# Patient Record
Sex: Female | Born: 1935 | Race: White | Hispanic: No | Smoking: Former smoker
Health system: Southern US, Community
[De-identification: ages and names within clinical notes are randomized; demographics above are authoritative.]

## PROBLEM LIST (undated history)

## (undated) DIAGNOSIS — R519 Headache, unspecified: Secondary | ICD-10-CM

## (undated) DIAGNOSIS — I499 Cardiac arrhythmia, unspecified: Secondary | ICD-10-CM

## (undated) DIAGNOSIS — J449 Chronic obstructive pulmonary disease, unspecified: Secondary | ICD-10-CM

## (undated) DIAGNOSIS — IMO0001 Reserved for inherently not codable concepts without codable children: Secondary | ICD-10-CM

## (undated) DIAGNOSIS — K5792 Diverticulitis of intestine, part unspecified, without perforation or abscess without bleeding: Secondary | ICD-10-CM

## (undated) DIAGNOSIS — I509 Heart failure, unspecified: Secondary | ICD-10-CM

## (undated) DIAGNOSIS — S82409A Unspecified fracture of shaft of unspecified fibula, initial encounter for closed fracture: Secondary | ICD-10-CM

## (undated) DIAGNOSIS — K219 Gastro-esophageal reflux disease without esophagitis: Secondary | ICD-10-CM

## (undated) DIAGNOSIS — S2220XA Unspecified fracture of sternum, initial encounter for closed fracture: Secondary | ICD-10-CM

## (undated) DIAGNOSIS — M1990 Unspecified osteoarthritis, unspecified site: Secondary | ICD-10-CM

## (undated) DIAGNOSIS — I4891 Unspecified atrial fibrillation: Secondary | ICD-10-CM

## (undated) DIAGNOSIS — S8263XA Displaced fracture of lateral malleolus of unspecified fibula, initial encounter for closed fracture: Secondary | ICD-10-CM

## (undated) DIAGNOSIS — R51 Headache: Secondary | ICD-10-CM

## (undated) HISTORY — PX: CARDIAC CATHETERIZATION: SHX172

## (undated) HISTORY — DX: Unspecified atrial fibrillation: I48.91

## (undated) HISTORY — PX: LAPAROSCOPIC CHOLECYSTECTOMY: SUR755

## (undated) HISTORY — PX: COLONOSCOPY: SHX174

## (undated) HISTORY — DX: Displaced fracture of lateral malleolus of unspecified fibula, initial encounter for closed fracture: S82.63XA

## (undated) HISTORY — DX: Unspecified fracture of sternum, initial encounter for closed fracture: S22.20XA

## (undated) HISTORY — DX: Unspecified fracture of shaft of unspecified fibula, initial encounter for closed fracture: S82.409A

## (undated) HISTORY — PX: VAGINAL HYSTERECTOMY: SUR661

## (undated) HISTORY — PX: ATRIAL FIBRILLATION ABLATION: SHX5732

## (undated) HISTORY — PX: JOINT REPLACEMENT: SHX530

---

## 1988-03-31 ENCOUNTER — Encounter: Payer: Self-pay | Admitting: Cardiology

## 2007-03-27 LAB — PULMONARY FUNCTION TEST

## 2007-06-14 ENCOUNTER — Encounter: Payer: Self-pay | Admitting: Cardiology

## 2007-08-21 ENCOUNTER — Encounter: Payer: Self-pay | Admitting: Cardiology

## 2009-02-19 ENCOUNTER — Encounter: Payer: Self-pay | Admitting: Cardiology

## 2009-02-24 ENCOUNTER — Ambulatory Visit: Payer: Self-pay | Admitting: Occupational Medicine

## 2009-02-28 ENCOUNTER — Ambulatory Visit: Payer: Self-pay | Admitting: Family Medicine

## 2009-02-28 DIAGNOSIS — B029 Zoster without complications: Secondary | ICD-10-CM

## 2009-02-28 HISTORY — DX: Zoster without complications: B02.9

## 2009-03-19 ENCOUNTER — Encounter: Payer: Self-pay | Admitting: Family Medicine

## 2009-05-13 ENCOUNTER — Ambulatory Visit: Payer: Self-pay | Admitting: Family Medicine

## 2009-05-13 DIAGNOSIS — I4891 Unspecified atrial fibrillation: Secondary | ICD-10-CM | POA: Insufficient documentation

## 2009-05-13 DIAGNOSIS — M810 Age-related osteoporosis without current pathological fracture: Secondary | ICD-10-CM | POA: Insufficient documentation

## 2009-05-27 ENCOUNTER — Telehealth: Payer: Self-pay | Admitting: Family Medicine

## 2009-05-28 ENCOUNTER — Encounter: Payer: Self-pay | Admitting: Family Medicine

## 2009-06-03 ENCOUNTER — Encounter: Payer: Self-pay | Admitting: Family Medicine

## 2009-06-03 LAB — CONVERTED CEMR LAB
INR: 2.3 — ABNORMAL HIGH (ref 0.0–1.5)
Prothrombin Time: 20.4 s — ABNORMAL HIGH (ref 10.2–14.0)

## 2009-06-13 ENCOUNTER — Encounter: Payer: Self-pay | Admitting: Family Medicine

## 2009-06-13 LAB — CONVERTED CEMR LAB: INR: 2.4 — ABNORMAL HIGH (ref 0.0–1.5)

## 2009-07-14 ENCOUNTER — Encounter: Payer: Self-pay | Admitting: Family Medicine

## 2009-07-14 LAB — CONVERTED CEMR LAB
INR: 2.1 — ABNORMAL HIGH (ref 0.0–1.5)
Prothrombin Time: 19.3 s — ABNORMAL HIGH (ref 10.2–14.0)

## 2009-08-04 ENCOUNTER — Encounter: Payer: Self-pay | Admitting: Family Medicine

## 2009-08-04 LAB — CONVERTED CEMR LAB
INR: 2.8 — ABNORMAL HIGH (ref 0.0–1.5)
Prothrombin Time: 22.8 s — ABNORMAL HIGH (ref 10.2–14.0)

## 2009-08-13 ENCOUNTER — Ambulatory Visit: Payer: Self-pay | Admitting: Family Medicine

## 2009-08-13 ENCOUNTER — Encounter: Payer: Self-pay | Admitting: Family Medicine

## 2009-08-28 ENCOUNTER — Encounter: Payer: Self-pay | Admitting: Family Medicine

## 2009-08-28 LAB — CONVERTED CEMR LAB
INR: 2.21 — ABNORMAL HIGH (ref <1.50)
Prothrombin Time: 19.7 s — ABNORMAL HIGH (ref 10.2–14.0)

## 2009-10-09 ENCOUNTER — Encounter: Payer: Self-pay | Admitting: Family Medicine

## 2009-10-09 LAB — CONVERTED CEMR LAB
INR: 1.74 — ABNORMAL HIGH (ref <1.50)
Prothrombin Time: 17 s — ABNORMAL HIGH (ref 10.2–14.0)

## 2009-10-23 ENCOUNTER — Telehealth: Payer: Self-pay | Admitting: Family Medicine

## 2009-10-23 ENCOUNTER — Encounter: Payer: Self-pay | Admitting: Family Medicine

## 2009-10-23 LAB — CONVERTED CEMR LAB
INR: 1.74 — ABNORMAL HIGH (ref <1.50)
Prothrombin Time: 17 s — ABNORMAL HIGH (ref 10.2–14.0)

## 2009-11-01 ENCOUNTER — Encounter: Payer: Self-pay | Admitting: Family Medicine

## 2009-11-03 LAB — CONVERTED CEMR LAB: Prothrombin Time: 24.5 s — ABNORMAL HIGH (ref 11.6–15.2)

## 2009-11-17 ENCOUNTER — Encounter: Payer: Self-pay | Admitting: Family Medicine

## 2009-12-10 ENCOUNTER — Ambulatory Visit: Payer: Self-pay | Admitting: Family Medicine

## 2009-12-10 ENCOUNTER — Telehealth: Payer: Self-pay | Admitting: Family Medicine

## 2009-12-12 ENCOUNTER — Encounter: Payer: Self-pay | Admitting: Family Medicine

## 2009-12-12 ENCOUNTER — Telehealth: Payer: Self-pay | Admitting: Family Medicine

## 2009-12-12 LAB — CONVERTED CEMR LAB: INR: 1.47

## 2010-01-05 ENCOUNTER — Encounter: Payer: Self-pay | Admitting: Family Medicine

## 2010-01-06 ENCOUNTER — Telehealth: Payer: Self-pay | Admitting: Family Medicine

## 2010-01-07 ENCOUNTER — Encounter: Payer: Self-pay | Admitting: Family Medicine

## 2010-01-26 ENCOUNTER — Encounter: Payer: Self-pay | Admitting: Family Medicine

## 2010-01-26 LAB — CONVERTED CEMR LAB: INR: 2.21 — ABNORMAL HIGH (ref <1.50)

## 2010-02-11 ENCOUNTER — Encounter: Payer: Self-pay | Admitting: Cardiology

## 2010-02-11 ENCOUNTER — Ambulatory Visit: Payer: Self-pay | Admitting: Cardiology

## 2010-02-12 ENCOUNTER — Ambulatory Visit: Payer: Self-pay | Admitting: Family Medicine

## 2010-02-16 ENCOUNTER — Encounter: Payer: Self-pay | Admitting: Family Medicine

## 2010-02-16 LAB — CONVERTED CEMR LAB
INR: 2.97 — ABNORMAL HIGH (ref <1.50)
Prothrombin Time: 23.6 s — ABNORMAL HIGH (ref 10.2–14.0)

## 2010-03-04 ENCOUNTER — Telehealth: Payer: Self-pay | Admitting: Family Medicine

## 2010-03-10 ENCOUNTER — Ambulatory Visit: Payer: Self-pay | Admitting: Family Medicine

## 2010-03-10 LAB — CONVERTED CEMR LAB
Glucose, Urine, Semiquant: NEGATIVE
Ketones, urine, test strip: NEGATIVE
Nitrite: NEGATIVE

## 2010-03-25 ENCOUNTER — Encounter: Payer: Self-pay | Admitting: Family Medicine

## 2010-04-08 ENCOUNTER — Ambulatory Visit: Payer: Self-pay | Admitting: Family Medicine

## 2010-04-08 LAB — CONVERTED CEMR LAB
Bilirubin Urine: NEGATIVE
INR: 1.85
Nitrite: NEGATIVE
Prothrombin Time: 21 s — ABNORMAL HIGH (ref 11.6–15.2)
Specific Gravity, Urine: 1.025

## 2010-04-09 ENCOUNTER — Encounter: Payer: Self-pay | Admitting: Family Medicine

## 2010-04-09 LAB — CONVERTED CEMR LAB
Trich, Wet Prep: NONE SEEN
Yeast Wet Prep HPF POC: NONE SEEN

## 2010-04-13 ENCOUNTER — Encounter: Admission: RE | Admit: 2010-04-13 | Discharge: 2010-04-13 | Payer: Self-pay | Admitting: Family Medicine

## 2010-04-15 ENCOUNTER — Encounter: Payer: Self-pay | Admitting: Family Medicine

## 2010-04-16 ENCOUNTER — Encounter: Payer: Self-pay | Admitting: Family Medicine

## 2010-04-21 ENCOUNTER — Ambulatory Visit: Payer: Self-pay | Admitting: Family Medicine

## 2010-04-21 DIAGNOSIS — Z87440 Personal history of urinary (tract) infections: Secondary | ICD-10-CM | POA: Insufficient documentation

## 2010-04-21 LAB — CONVERTED CEMR LAB
Bilirubin Urine: NEGATIVE
Glucose, Urine, Semiquant: NEGATIVE
Nitrite: NEGATIVE
pH: 5

## 2010-04-22 ENCOUNTER — Encounter: Payer: Self-pay | Admitting: Family Medicine

## 2010-04-23 LAB — CONVERTED CEMR LAB
AST: 21 units/L (ref 0–37)
Albumin: 3.9 g/dL (ref 3.5–5.2)
Alkaline Phosphatase: 64 units/L (ref 39–117)
CO2: 27 meq/L (ref 19–32)
Creatinine, Ser: 1.03 mg/dL (ref 0.40–1.20)
Glucose, Bld: 113 mg/dL — ABNORMAL HIGH (ref 70–99)
Potassium: 4.4 meq/L (ref 3.5–5.3)
Sodium: 142 meq/L (ref 135–145)
Total Protein: 6.5 g/dL (ref 6.0–8.3)

## 2010-04-29 ENCOUNTER — Encounter: Payer: Self-pay | Admitting: Cardiology

## 2010-04-29 ENCOUNTER — Ambulatory Visit: Payer: Self-pay | Admitting: Cardiology

## 2010-05-28 ENCOUNTER — Telehealth: Payer: Self-pay | Admitting: Cardiology

## 2010-05-29 ENCOUNTER — Encounter (INDEPENDENT_AMBULATORY_CARE_PROVIDER_SITE_OTHER): Payer: Self-pay | Admitting: *Deleted

## 2010-06-02 ENCOUNTER — Telehealth: Payer: Self-pay | Admitting: Cardiology

## 2010-06-02 ENCOUNTER — Telehealth: Payer: Self-pay | Admitting: Family Medicine

## 2010-06-03 ENCOUNTER — Telehealth: Payer: Self-pay | Admitting: Family Medicine

## 2010-06-08 ENCOUNTER — Encounter: Payer: Self-pay | Admitting: Family Medicine

## 2010-06-08 ENCOUNTER — Encounter: Payer: Self-pay | Admitting: Cardiology

## 2010-06-15 ENCOUNTER — Encounter: Payer: Self-pay | Admitting: Family Medicine

## 2010-06-15 LAB — CONVERTED CEMR LAB: INR: 2.7 — ABNORMAL HIGH (ref <1.50)

## 2010-06-16 ENCOUNTER — Encounter: Payer: Self-pay | Admitting: Family Medicine

## 2010-06-29 ENCOUNTER — Telehealth: Payer: Self-pay | Admitting: Family Medicine

## 2010-06-29 ENCOUNTER — Encounter: Payer: Self-pay | Admitting: Family Medicine

## 2010-06-29 LAB — CONVERTED CEMR LAB: INR: 3.43

## 2010-06-30 ENCOUNTER — Encounter: Payer: Self-pay | Admitting: Family Medicine

## 2010-06-30 LAB — CONVERTED CEMR LAB
INR: 3.43 — ABNORMAL HIGH (ref <1.50)
Prothrombin Time: 33.8 s — ABNORMAL HIGH (ref 11.6–15.2)

## 2010-07-13 ENCOUNTER — Encounter: Payer: Self-pay | Admitting: Family Medicine

## 2010-07-14 LAB — CONVERTED CEMR LAB: Prothrombin Time: 31.9 s — ABNORMAL HIGH (ref 11.6–15.2)

## 2010-07-22 ENCOUNTER — Encounter: Payer: Self-pay | Admitting: Family Medicine

## 2010-07-22 LAB — CONVERTED CEMR LAB: Prothrombin Time: 26.2 s — ABNORMAL HIGH (ref 11.6–15.2)

## 2010-07-24 ENCOUNTER — Ambulatory Visit: Payer: Self-pay | Admitting: Family Medicine

## 2010-07-27 ENCOUNTER — Ambulatory Visit: Payer: Self-pay | Admitting: Family Medicine

## 2010-07-27 LAB — CONVERTED CEMR LAB
Blood in Urine, dipstick: NEGATIVE
Nitrite: NEGATIVE
Urobilinogen, UA: 1

## 2010-07-28 ENCOUNTER — Telehealth: Payer: Self-pay | Admitting: Family Medicine

## 2010-08-05 ENCOUNTER — Encounter: Payer: Self-pay | Admitting: Family Medicine

## 2010-08-05 LAB — CONVERTED CEMR LAB: Prothrombin Time: 24.2 s — ABNORMAL HIGH (ref 11.6–15.2)

## 2010-08-17 ENCOUNTER — Telehealth: Payer: Self-pay | Admitting: Family Medicine

## 2010-08-17 ENCOUNTER — Encounter: Payer: Self-pay | Admitting: Family Medicine

## 2010-08-17 LAB — CONVERTED CEMR LAB
AST: 25 units/L
Alkaline Phosphatase: 51 units/L
Anion Gap: 7
INR: 1.6

## 2010-08-20 ENCOUNTER — Encounter: Payer: Self-pay | Admitting: Family Medicine

## 2010-08-26 ENCOUNTER — Encounter: Payer: Self-pay | Admitting: Family Medicine

## 2010-09-09 ENCOUNTER — Telehealth: Payer: Self-pay | Admitting: Family Medicine

## 2010-09-19 ENCOUNTER — Ambulatory Visit: Payer: Self-pay | Admitting: Family Medicine

## 2010-09-22 ENCOUNTER — Encounter: Payer: Self-pay | Admitting: Family Medicine

## 2010-09-23 ENCOUNTER — Telehealth (INDEPENDENT_AMBULATORY_CARE_PROVIDER_SITE_OTHER): Payer: Self-pay

## 2010-09-28 ENCOUNTER — Encounter: Payer: Self-pay | Admitting: Family Medicine

## 2010-09-28 ENCOUNTER — Telehealth: Payer: Self-pay | Admitting: Family Medicine

## 2010-09-28 ENCOUNTER — Ambulatory Visit: Payer: Self-pay | Admitting: Family Medicine

## 2010-09-28 DIAGNOSIS — R9389 Abnormal findings on diagnostic imaging of other specified body structures: Secondary | ICD-10-CM | POA: Insufficient documentation

## 2010-10-05 ENCOUNTER — Encounter: Payer: Self-pay | Admitting: Family Medicine

## 2010-10-05 LAB — CONVERTED CEMR LAB: INR: 2.49 — ABNORMAL HIGH (ref <1.50)

## 2010-10-09 ENCOUNTER — Telehealth (INDEPENDENT_AMBULATORY_CARE_PROVIDER_SITE_OTHER): Payer: Self-pay | Admitting: *Deleted

## 2010-10-16 ENCOUNTER — Telehealth: Payer: Self-pay | Admitting: Family Medicine

## 2010-10-16 ENCOUNTER — Encounter: Payer: Self-pay | Admitting: Family Medicine

## 2010-10-22 ENCOUNTER — Encounter: Payer: Self-pay | Admitting: Family Medicine

## 2010-10-22 LAB — CONVERTED CEMR LAB
INR: 2.05 — ABNORMAL HIGH (ref <1.50)
Prothrombin Time: 22.7 s — ABNORMAL HIGH (ref 11.6–15.2)

## 2010-11-11 ENCOUNTER — Encounter: Payer: Self-pay | Admitting: Family Medicine

## 2010-11-11 LAB — CONVERTED CEMR LAB
INR: 2.68 — ABNORMAL HIGH (ref <1.50)
Prothrombin Time: 28 s — ABNORMAL HIGH (ref 11.6–15.2)

## 2010-11-17 NOTE — Letter (Signed)
Summary: Letter to Patient Regarding Lab Results/[facility]  Letter to Patient Regarding Lab Results/[facility]   Imported By: Lanelle Bal 04/23/2010 13:17:54  _____________________________________________________________________  External Attachment:    Type:   Image     Comment:   External Document

## 2010-11-17 NOTE — Progress Notes (Signed)
Summary: question re med  Phone Note Call from Patient   Caller: Patient Reason for Call: Talk to Nurse Summary of Call: pt switching from coumadin to pradaxa, want to know if she just stops the coumadin and starts the pradaxa or is there a wait time between meds? also wants to know what symptoms to look out for? pls call [phone] ok to leave msg Initial call taken by: Glynda Jaeger,  May 28, 2010 9:50 AM  Follow-up for Phone Call        I spoke with the pt. She has decided against having a colonoscopy done. She will go ahead and start holding her coumadin today x 3 days. She is coming to pick up a lab order for a CBC/BMP in 7-10 days. The pt is agreeable. Follow-up by: Sherri Rad, RN, BSN,  May 28, 2010 10:09 AM

## 2010-11-17 NOTE — Progress Notes (Signed)
Summary: PT/INR results  Phone Note Other Incoming   Caller: [facility] Summary of Call: PT=17.0 INR=1.74 Initial call taken by: [name],  October 23, 2009 11:14 AM

## 2010-11-17 NOTE — Progress Notes (Signed)
Summary: Coumadin instructions  Phone Note Call from Patient Call back at Home Phone [phone]   Caller: Patient Call For: [name] MD Summary of Call: Ptcalls and wanted to know instructions for her Coumadin therapy- she is not gonna start the new med until after gets back from her trip to [place] in the Spring- please advise Initial call taken by: [name],  December 12, 2009 10:00 AM  Follow-up for Phone Call        Pt notified of Coumadin instructions Follow-up by: [name],  December 12, 2009 11:16 AM    New/Updated Medications: COUMADIN 5 MG TABS (WARFARIN SODIUM) Sunday - 6 mg, Monday - 5 mg, Tuesday - 6 mg, Wednesday - 5 mg, Thursday - 6 mg, Friday - 5 mg, Saturday - 6 mg COUMADIN 6 MG TABS (WARFARIN SODIUM) Sunday - 6 mg, Monday - 5 mg, Tuesday - 6 mg, Wednesday - 5 mg, Thursday - 6 mg, Friday - 5 mg, Saturday - 6 mg   Anticoagulation Management History:      Her anticoagulation is being managed by telephone today.  Anticoagulation is being administered due to chronic atrial fibrillation.  Plans are to keep the patient on coumadin for life.  Her last INR was 1.47 and today's INR is 1.47.    Anticoagulation Management Assessment/Plan:      The target INR is 2.0-3.0.  She is to have a PT/INR in 2 weeks.  Anticoagulation instructions were given to patient.         Current Anticoagulation Instructions: Take 6mg  tonight and then increase as below. Coumadin 5 mg tabs and Coumadin 6 mg tabs:  Sunday - 6 mg, Monday - 5 mg, Tuesday - 6 mg, Wednesday - 5 mg, Thursday - 6 mg, Friday - 5 mg, Saturday - 6 mg.  Repeat PT/INR in 2 weeks.

## 2010-11-17 NOTE — Progress Notes (Signed)
Summary: UTI sxs  Phone Note Call from Patient   Caller: Patient Call For: Terri Gasser MD Summary of Call: pt has UTI signs and has a hx of UTI's would like abx called in.Pt is in [place]. [facility] [place], [place]  Number is [phone] fax number is [phone] Initial call taken by: Avon Gully CMA, Duncan Dull),  September 09, 2010 9:15 AM  Follow-up for Phone Call        Rx Called In Follow-up by: Terri Gasser MD,  September 09, 2010 9:21 AM    New/Updated Medications: CIPROFLOXACIN HCL 500 MG TABS (CIPROFLOXACIN HCL) Take 1 tablet by mouth two times a day for 3 days Prescriptions: CIPROFLOXACIN HCL 500 MG TABS (CIPROFLOXACIN HCL) Take 1 tablet by mouth two times a day for 3 days  #6 x 0   Entered and Authorized by:   Terri Gasser MD   Signed by:   Terri Gasser MD on 09/09/2010   Method used:   Printed then faxed to ...       [facility]  [address] [phone]* (retail)       [address] [place], [place]  [address]       Ph: [phone] or [phone]       Fax: [phone]   RxID:   [id number]   Appended Document: UTI sxs 09/09/2010- [place]  [place] They [place] and had not received med. KJ LPN

## 2010-11-17 NOTE — Assessment & Plan Note (Signed)
Summary: URINE--UTI?  Nurse Visit   Allergies: 1)  ! Sulfa Laboratory Results   Urine Tests  Date/Time Received: 03/10/2010 Date/Time Reported: 03/10/2010  Routine Urinalysis   Color: yellow Appearance: Clear Glucose: negative   (Normal Range: Negative) Bilirubin: negative   (Normal Range: Negative) Ketone: negative   (Normal Range: Negative) Spec. Gravity: 1.020   (Normal Range: 1.003-1.035) Blood: trace-intact   (Normal Range: Negative) pH: 6.0   (Normal Range: 5.0-8.0) Protein: negative   (Normal Range: Negative) Urobilinogen: 0.2   (Normal Range: 0-1) Nitrite: negative   (Normal Range: Negative) Leukocyte Esterace: moderate   (Normal Range: Negative)       Orders Added: 1)  UA Dipstick w/o Micro (automated)  [81003] 2)  Est. Patient Level I [98119] Prescriptions: CEPHALEXIN 500 MG CAPS (CEPHALEXIN) 1 capsule by mouth three times a day x 7 days  #21 x 0   Entered and Authorized by:   Seymour Bars DO   Signed by:   Seymour Bars DO on 03/10/2010   Method used:   [facility]        [facility] [phone][facility] (retail)       [address]       [place], [place]  [address]       Ph: [phone] or [phone]       Fax: [phone]   RxID:   [id number]     Impression & Recommendations:  Problem # 1:  UTI (ICD-599.0) Treat with 7 days of Keflex.  Call if symptoms are not improving by Friday AM to recollect and cx. Her updated medication list for this problem includes:    Cephalexin 500 Mg Caps (Cephalexin) .Marland Kitchen... 1 capsule by mouth three times a day x 7 days  Orders: UA Dipstick w/o Micro (automated)  (81003)  Complete Medication List: 1)  Diltiazem Hcl Cr 240 Mg Xr24h-cap (Diltiazem hcl) .... Take 1 tablet by mouth once a day 2)  Coumadin Check  .... Pt/inr up to once a week.   dx: atrial fibrillation 3)  Vitamin C 1000 Mg Tabs (Ascorbic acid) .... Take 1 tablet by mouth once a day 4)  Glucosamine Chondroitin Complx Caps  (Glucosamine-chondroit-biofl-mn) .... Take 1 tablet by mouth once a day 5)  Fish Oil 1000 Mg Caps (Omega-3 fatty acids) .... Take one tablet by mouth once a day 6)  Eql Coq10 300 Mg Caps (Coenzyme q10) .... Once a day 7)  Lutein 20 Mg Caps (Lutein) .... Take one tablet by mouth once a day 8)  Warfarin Sodium 5 Mg Tabs (Warfarin sodium) .... Use as directed by anticoagulation clinic 9)  Warfarin Sodium 1 Mg Tabs (Warfarin sodium) .... Use as directed by anticoagualtion clinic 10)  Vitamin D 5000iu  .... Once a day 11)  Cephalexin 500 Mg Caps (Cephalexin) .Marland Kitchen.. 1 capsule by mouth three times a day x 7 days

## 2010-11-17 NOTE — Progress Notes (Signed)
Summary: Question about medication  Phone Note Call from Patient Call back at Home Phone [phone]   Caller: Patient Summary of Call: Pt have question about medication Initial call taken by: Judie Grieve,  June 02, 2010 9:20 AM  Follow-up for Phone Call        spoke with pt, she is having alot of GI symptoms from the pradaxa. the pt wishes to switch back to coumadin. will discuss with sally, pharm md. Deliah Goody, RN  June 02, 2010 10:33 AM  discussed with Orma Flaming MD, pt instructed to take pradaxa 150mg  two times a day for the next three days and also to take coumadin 6mg . after the three days she will stop the pradaxa and cont on the coumadin 6mg  daily. 6mg  of coumadin was her previous dosage. I left a merssage with dr Misty Stanley CMA to call pt and set up a protime check with them on tuesday or wed next week. will make dr Cathey Endow aware of the change. Deliah Goody, RN  June 02, 2010 10:44 AM

## 2010-11-17 NOTE — Assessment & Plan Note (Signed)
Summary: A fib   Vital Signs:  Patient profile:   77 year old female Height:      63.5 inches Weight:      150 pounds Pulse rate:   63 / minute BP sitting:   122 / 77  (left arm) Cuff size:   regular  Vitals Entered By: [name] (December 10, 2009 11:33 AM) CC: discuss meds   Primary Care Provider:  [name] MD  CC:  discuss meds.  History of Present Illness: Brought in an article from [facility] and is very interested in Pradaxa instead of coumadin for her afib.  Would like to discuss.   She is otherwise doing well and neesd a refil on her cardizem as well, which she uses for rate control.  NO CP, SOB, etc. She went to [facility] x 1 for cards but would like to start seeing cardiology here in our building with Dr. Jens Som as she feels it is much more convenient.   She also has some concern wtih her husband and his increased libido with his parkinsons.  This has been a little distressing for her.    Current Medications (verified): 1)  Diltiazem Hcl Cr 240 Mg Xr24h-Cap (Diltiazem Hcl) .... Take 1 Tablet By Mouth Once A Day 2)  Coumadin Check .... Pt/inr Up To Once A Week.   Dx: Atrial Fibrillation 3)  Liquid Calcium/vitamin D 600-200 Mg-Unit Caps (Calcium Carbonate-Vitamin D) .... Take 1 Tablet By Mouth Two Times A Day 4)  Vitamin C 1000 Mg Tabs (Ascorbic Acid) .... Take 1 Tablet By Mouth Once A Day 5)  Glucosamine Chondroitin Complx  Caps (Glucosamine-Chondroit-Biofl-Mn) .... Take 1 Tablet By Mouth Once A Day 6)  Coumadin 5 Mg Tabs (Warfarin Sodium) .... Sunday - 5 Mg, Monday - 5 Mg, Tuesday - 6 Mg, Wednesday - 5 Mg, Thursday - 6 Mg, Friday - 5 Mg, Saturday - 6 Mg 7)  Coumadin 6 Mg Tabs (Warfarin Sodium) .... Sunday - 5 Mg, Monday - 5 Mg, Tuesday - 6 Mg, Wednesday - 5 Mg, Thursday - 6 Mg, Friday - 5 Mg, Saturday - 6 Mg 8)  Fish Oil 1000 Mg Caps (Omega-3 Fatty Acids) .... Take One Tablet By Mouth Once A Day 9)  Co Q-10 150 Mg Caps (Coenzyme Q10) .... Take One  Tablet By Mouth Once A Day 10)  Lutein 20 Mg Caps (Lutein) .... Take One Tablet By Mouth Once A Day  Allergies (verified): No Known Drug Allergies  Comments:  Nurse/Medical Assistant: The patient's medications and allergies were reviewed with the patient and were updated in the Medication and Allergy Lists. [name] (December 10, 2009 11:35 AM)  Social History: REtired.  Some college. Married to her husband  Terri Small who has mild dementa and parkinsons.   Has 4 adult children. Lives with his daughter.   Former Smoker, quit 1961 Alcohol use-yes Drug use-no, 1-2 per week.  Regular exercise-yes   Impression & Recommendations:  Problem # 1:  ATRIAL FIBRILLATION (ICD-427.31) Discussed pradaxa.  It is more expensive but depending on the price the pt says she may be willing to pay it to not have to go to the lab for frequent INRs.  Wall call cardiology and ask their opinoin before we change it. Also needs new standing order for the lab. Went for her INR today. will f/u resuilts today.  Her updated medication list for this problem includes:    Diltiazem Hcl Cr 240 Mg Xr24h-cap (Diltiazem hcl) .Marland Kitchen... Take  1 tablet by mouth once a day    Coumadin 5 Mg Tabs (Warfarin sodium) ..... Sunday - 5 mg, monday - 5 mg, tuesday - 6 mg, wednesday - 5 mg, thursday - 6 mg, friday - 5 mg, saturday - 6 mg    Coumadin 1 Mg Tabs (Warfarin sodium) .Marland Kitchen... Take 1 tablet by mouth once a day as needed  Complete Medication List: 1)  Diltiazem Hcl Cr 240 Mg Xr24h-cap (Diltiazem hcl) .... Take 1 tablet by mouth once a day 2)  Coumadin Check  .... Pt/inr up to once a week.   dx: atrial fibrillation 3)  Liquid Calcium/vitamin D 600-200 Mg-unit Caps (Calcium carbonate-vitamin d) .... Take 1 tablet by mouth two times a day 4)  Vitamin C 1000 Mg Tabs (Ascorbic acid) .... Take 1 tablet by mouth once a day 5)  Glucosamine Chondroitin Complx Caps (Glucosamine-chondroit-biofl-mn) .... Take 1 tablet by mouth once a day 6)   Coumadin 5 Mg Tabs (Warfarin sodium) .... Sunday - 5 mg, monday - 5 mg, tuesday - 6 mg, wednesday - 5 mg, thursday - 6 mg, friday - 5 mg, saturday - 6 mg 7)  Coumadin 1 Mg Tabs (Warfarin sodium) .... Take 1 tablet by mouth once a day as needed 8)  Fish Oil 1000 Mg Caps (Omega-3 fatty acids) .... Take one tablet by mouth once a day 9)  Co Q-10 150 Mg Caps (Coenzyme q10) .... Take one tablet by mouth once a day 10)  Lutein 20 Mg Caps (Lutein) .... Take one tablet by mouth once a day Prescriptions: COUMADIN 1 MG TABS (WARFARIN SODIUM) Take 1 tablet by mouth once a day as needed Brand medically necessary #90 x 0   Entered and Authorized by:   [name] MD   Signed by:   [name] MD on 12/10/2009   Method used:   Print then Give to Patient   RxID:   [id number] COUMADIN 5 MG TABS (WARFARIN SODIUM) Sunday - 5 mg, Monday - 5 mg, Tuesday - 6 mg, Wednesday - 5 mg, Thursday - 6 mg, Friday - 5 mg, Saturday - 6 mg Brand medically necessary #90 x 0   Entered and Authorized by:   [name] MD   Signed by:   [name] MD on 12/10/2009   Method used:   Print then Give to Patient   RxID:   [id number] DILTIAZEM HCL CR 240 MG XR24H-CAP (DILTIAZEM HCL) Take 1 tablet by mouth once a day  #90 x 3   Entered and Authorized by:   [name] MD   Signed by:   [name] MD on 12/10/2009   Method used:   Print then Give to Patient   RxID:   [id number]

## 2010-11-17 NOTE — Assessment & Plan Note (Signed)
Summary: FLU SHOT  Nurse Visit   Allergies: 1)  ! Sulfa  Orders Added: 1)  Flu Vaccine 68yrs + MEDICARE PATIENTS [Q2039] 2)  Administration Flu vaccine - MCR [G0008] Flu Vaccine Consent Questions     Do you have a history of severe allergic reactions to this vaccine? no    Any prior history of allergic reactions to egg and/or gelatin? no    Do you have a sensitivity to the preservative Thimersol? no    Do you have a past history of Guillan-Barre Syndrome? no    Do you currently have an acute febrile illness? no    Have you ever had a severe reaction to latex? no    Vaccine information given and explained to patient? yes    Are you currently pregnant? no    Lot Number:[id number]   Exp Date:04/17/2011   Site Given  Left Deltoid IM.lbmedflu

## 2010-11-17 NOTE — Progress Notes (Signed)
Summary: Courtesy Call  Phone Note Outgoing Call   Call placed by: Areta Haber CMA,  September 23, 2010 11:06 AM Summary of Call: Courtesy call to pt - Courtesy mess LOVM of hm number. Initial call taken by: Areta Haber CMA,  September 23, 2010 11:08 AM

## 2010-11-17 NOTE — Progress Notes (Signed)
Summary: CALL A NURSE  Phone Note From Other Clinic   Caller: CALL A NURSE Summary of Call: Office Message from Date: Time of Call: 5:17:51 PM Faxed To: [place] - [facility]ryne Sharper CallerJaquala Fuller Fax Number: [phone] Facility: home Patient: Terri Small, Terri Small DOB: 04-12-1931 Phone: [phone] Provider: [name] Message: Pt asked that I fax a message to the office. She was in earlier today and had a U/A done and was wondering if anyone had received the results or called in any meds for her. She asked that if someone is there that can let her know if results have been received or not she would greatly appreciate it. I also told her to follow up with office tomorrow if she doesn't here from anyone this evening. Initial call taken by: Payton Spark CMA,  July 28, 2010 8:19 AM  Follow-up for Phone Call        pt was notified this am Follow-up by: Avon Gully CMA, Duncan Dull),  July 28, 2010 9:25 AM

## 2010-11-17 NOTE — Assessment & Plan Note (Signed)
Summary: ?UTI,  Nurse Visit   Primary Care Provider:  [name] MD   History of Present Illness: burning with urination x 2 days   Allergies: 1)  ! Sulfa Laboratory Results   Urine Tests  Date/Time Received: 07/27/10 Date/Time Reported: 07/27/10  Routine Urinalysis   Color: yellow Appearance: Clear Glucose: negative   (Normal Range: Negative) Bilirubin: small   (Normal Range: Negative) Ketone: small (15)   (Normal Range: Negative) Spec. Gravity: >=1.030   (Normal Range: 1.003-1.035) Blood: negative   (Normal Range: Negative) pH: 5.0   (Normal Range: 5.0-8.0) Protein: 100   (Normal Range: Negative) Urobilinogen: 1.0   (Normal Range: 0-1) Nitrite: negative   (Normal Range: Negative) Leukocyte Esterace: trace   (Normal Range: Negative)       Orders Added: 1)  UA Dipstick w/o Micro (automated)  [81003] 2)  T-Urine Culture ([facility] Order) [[id number]] 3)  Est. Patient Level I [02774]   Impression & Recommendations:  Problem # 1:  UTI'S, RECURRENT (ICD-599.0) Dysuria. Will send culture as her UA is neg.   Orders: UA Dipstick w/o Micro (automated)  (81003) T-Urine Culture ([facility] Order) [id number]) Est. Patient Level I (09470)  Complete Medication List: 1)  Diltiazem Hcl Cr 240 Mg Xr24h-cap (Diltiazem hcl) .... Take 1 tablet by mouth once a day 2)  Vitamin C 1000 Mg Tabs (Ascorbic acid) .... Take 1 tablet by mouth once a day 3)  Glucosamine Chondroitin Complx Caps (Glucosamine-chondroit-biofl-mn) .... Take 1 tablet by mouth once a day 4)  Eql Coq10 300 Mg Caps (Coenzyme q10) .... Once a day 5)  Vitamin D 5000iu  .... Once a day 6)  Pradaxa 150 Mg Caps (Dabigatran etexilate mesylate) .... One tablet two times a day 7)  Zostavax 96283 Unt/0.37ml Solr (Zoster vaccine live) .... Inject im single dose. 8)  Coumadin 5 Mg Tabs (Warfarin sodium) .... Sunday - 6 mg, monday - 5 mg, tuesday - 6 mg, wednesday - 6 mg, thursday - 5 mg, friday - 6 mg, saturday -  5 mg 9)  Coumadin 6 Mg Tabs (Warfarin sodium) .... Sunday - 6 mg, monday - 5 mg, tuesday - 6 mg, wednesday - 6 mg, thursday - 5 mg, friday - 6 mg, saturday - 5 mg    Appended Document: ?UTI, 07/28/10 8:21 spoke with pt and told her we will send a culture and call her with those results.pt aware that ua was neg. acm

## 2010-11-17 NOTE — Letter (Signed)
Summary: Patient Records 1989 - 2008  Patient Records 1989 - 2008   Imported By: Roderic Ovens 02/19/2010 13:25:10  _____________________________________________________________________  External Attachment:    Type:   Image     Comment:   External Document

## 2010-11-17 NOTE — Progress Notes (Signed)
Summary: meds  Phone Note Call from Patient   Caller: Patient Call For: [name] MD Summary of Call: do you know if there is any interaction with the shingles vaccine and coumadin. Pt called and wants to know Initial call taken by: Avon Gully CMA, Duncan Dull),  June 03, 2010 2:05 PM  Follow-up for Phone Call        No  interaction. She can gdo it.  Follow-up by: [name] MD,  June 03, 2010 2:26 PM  Additional Follow-up for Phone Call Additional follow up Details #1::        Pt notified of MD instructions Additional Follow-up by: [name],  June 03, 2010 2:31 PM

## 2010-11-17 NOTE — Letter (Signed)
Summary: Dr Einar Crow [facility] ICD 9 Report  Dr Einar [facility] ICD 9 Report   Imported By: Roderic Ovens 02/19/2010 13:21:03  _____________________________________________________________________  External Attachment:    Type:   Image     Comment:   External Document

## 2010-11-17 NOTE — Progress Notes (Signed)
Summary: INR  Phone Note Outgoing Call   Summary of Call: Call pt: On the labs we recieved the INR was 1.6. Adjust coumadin as below.  Initial call taken by: [name] MD,  August 17, 2010 11:34 AM    New/Updated Medications: COUMADIN 5 MG TABS (WARFARIN SODIUM) Sunday - 5 mg, Monday - 6 mg, Tuesday - 6 mg, Wednesday - 6 mg, Thursday - 5 mg, Friday - 6 mg, Saturday - 6 mg COUMADIN 6 MG TABS (WARFARIN SODIUM) Sunday - 5 mg, Monday - 6 mg, Tuesday - 6 mg, Wednesday - 6 mg, Thursday - 5 mg, Friday - 6 mg, Saturday - 6 mg \  Anticoagulation Management History:      The patient is on coumadin and comes in today for a routine follow up visit.  She is being anticoagulated because of chronic atrial fibrillation.  Plans are to keep the patient on coumadin for life.  Her last INR was 2.22 and today's INR is 1.6.    Anticoagulation Management Assessment/Plan:      The target INR is 2.0-3.0.  She is to have a PT/INR in 1 week.  Anticoagulation instructions were given to patient.         Current Anticoagulation Instructions: The patient's dosage of coumadin will be increased.  The new dosage includes: Coumadin 5 mg tabs and Coumadin 6 mg tabs:  Sunday - 5 mg, Monday - 6 mg, Tuesday - 6 mg, Wednesday - 6 mg, Thursday - 5 mg, Friday - 6 mg, Saturday - 6 mg.  Repeat PT/INR in 1 week.     McCrimmon CMA, Duncan Dull), Sue Lush August 17, 2010 11:39 AM  called and spoke with pt and told her about coumdin doseage

## 2010-11-17 NOTE — Letter (Signed)
Summary: [facility]  [facility]   Imported By: Lanelle Bal 07/02/2010 11:27:10  _____________________________________________________________________  External Attachment:    Type:   Image     Comment:   External Document

## 2010-11-17 NOTE — Progress Notes (Signed)
  Phone Note Call from Patient Call back at [phone]   Caller: Patient Call For: [name] MD Summary of Call: Pt called to give the number where she could be reached when her PT/INR results came in Initial call taken by: [name],  January 06, 2010 12:01 PM     Appended Document: INR 01/07/2010 @ 1:34pm-Pt notified of results and instructions. KJ LPN  Anticoagulation Management History:      The patient is on coumadin and comes in today for a routine follow up visit.  The patient is taking Coumadin for chronic atrial fibrillation.  Plans are to keep the patient on coumadin for life.  Her last INR was 1.47 and today's INR is 2.0.    Anticoagulation Management Assessment/Plan:      The target INR is 2.0-3.0.  She is to have a PT/INR in 2 weeks.  Anticoagulation instructions were given to patient.         Current Anticoagulation Instructions: The patient is to continue with the same dose of coumadin.  This dosage includes: Coumadin 5 mg tabs and Coumadin 6 mg tabs:  Sunday - 6 mg, Monday - 5 mg, Tuesday - 6 mg, Wednesday - 5 mg, Thursday - 6 mg, Friday - 5 mg, Saturday - 6 mg.  Repeat PT/INR in 2 weeks.

## 2010-11-17 NOTE — Consult Note (Signed)
Summary: [facility]  [facility]   Imported By: Lanelle Bal 04/23/2010 13:15:26  _____________________________________________________________________  External Attachment:    Type:   Image     Comment:   External Document

## 2010-11-17 NOTE — Assessment & Plan Note (Signed)
Summary: Suture removal   Vital Signs:  Patient profile:   77 year old female Height:      63.5 inches Weight:      146 pounds Pulse rate:   64 / minute BP sitting:   101 / 62  (left arm) Cuff size:   regular  Vitals Entered By: [name] (February 12, 2010 9:28 AM) CC: stitch removal   Primary Care Provider:  [name] MD  CC:  stitch removal.  History of Present Illness: Hit chin during a fall playing frisbee about a week ago. Because on coumadin went to the ED at the [facility] and 3 stitches place.  No driange. Healing well. No pain or tenderness.   Current Medications (verified): 1)  Diltiazem Hcl Cr 240 Mg Xr24h-Cap (Diltiazem Hcl) .... Take 1 Tablet By Mouth Once A Day 2)  Coumadin Check .... Pt/inr Up To Once A Week.   Dx: Atrial Fibrillation 3)  Vitamin C 1000 Mg Tabs (Ascorbic Acid) .... Take 1 Tablet By Mouth Once A Day 4)  Glucosamine Chondroitin Complx  Caps (Glucosamine-Chondroit-Biofl-Mn) .... Take 1 Tablet By Mouth Once A Day 5)  Fish Oil 1000 Mg Caps (Omega-3 Fatty Acids) .... Take One Tablet By Mouth Once A Day 6)  Eql Coq10 300 Mg Caps (Coenzyme Q10) .... Once A Day 7)  Lutein 20 Mg Caps (Lutein) .... Take One Tablet By Mouth Once A Day 8)  Warfarin Sodium 5 Mg Tabs (Warfarin Sodium) .... Use As Directed By Anticoagulation Clinic 9)  Warfarin Sodium 1 Mg Tabs (Warfarin Sodium) .... Use As Directed By Anticoagualtion Clinic 10)  Vitamin D 5000iu .... Once A Day  Allergies (verified): 1)  ! Sulfa  Comments:  Nurse/Medical Assistant: The patient's medications and allergies were reviewed with the patient and were updated in the Medication and Allergy Lists. [name] (February 12, 2010 9:30 AM)  Physical Exam  General:  Well-developed,well-nourished,in no acute distress; alert,appropriate and cooperative throughout examination Skin:  Wound healing well, clean dry and intact. 3 sutures removed.    Impression & Recommendations:  Problem #  1:  LACERATION, FACE (ICD-873.40)  PT tolerated well. f/u wound care reviewed.    Orders: Suture Removal by Non-Operative MD [phone])  Complete Medication List: 1)  Diltiazem Hcl Cr 240 Mg Xr24h-cap (Diltiazem hcl) .... Take 1 tablet by mouth once a day 2)  Coumadin Check  .... Pt/inr up to once a week.   dx: atrial fibrillation 3)  Vitamin C 1000 Mg Tabs (Ascorbic acid) .... Take 1 tablet by mouth once a day 4)  Glucosamine Chondroitin Complx Caps (Glucosamine-chondroit-biofl-mn) .... Take 1 tablet by mouth once a day 5)  Fish Oil 1000 Mg Caps (Omega-3 fatty acids) .... Take one tablet by mouth once a day 6)  Eql Coq10 300 Mg Caps (Coenzyme q10) .... Once a day 7)  Lutein 20 Mg Caps (Lutein) .... Take one tablet by mouth once a day 8)  Warfarin Sodium 5 Mg Tabs (Warfarin sodium) .... Use as directed by anticoagulation clinic 9)  Warfarin Sodium 1 Mg Tabs (Warfarin sodium) .... Use as directed by anticoagualtion clinic 10)  Vitamin D 5000iu  .... Once a day Prescriptions: WARFARIN SODIUM 5 MG TABS (WARFARIN SODIUM) Use as directed by Anticoagulation Clinic  #90 x 4   Entered and Authorized by:   [name] MD   Signed by:   [name] MD on 02/12/2010   Method used:   [facility]  [address] [phone]* (retail)       [address] [place], [place]  [address]       Ph: [phone] or [phone]       Fax: [phone]   RxID:   [id number] DILTIAZEM HCL CR 240 MG XR24H-CAP (DILTIAZEM HCL) Take 1 tablet by mouth once a day  #90 x 4   Entered and Authorized by:   [name] MD   Signed by:   [name] MD on 02/12/2010   Method used:   [facility]        [facility] [phone]* (retail)       [address]       [place], [place]  [address]       Ph: [phone] or [phone]       Fax: [phone]   RxID:   [id number]

## 2010-11-17 NOTE — Miscellaneous (Signed)
Summary: Orders Update  Clinical Lists Changes  Orders: Added new Test order of T-Basic Metabolic Panel ([id number]) - Signed Added new Test order of T-CBC No Diff (85027-10000) - Signed 

## 2010-11-17 NOTE — Assessment & Plan Note (Signed)
Summary: COLD/TM (rm 3)   Vital Signs:  Patient Profile:   77 Years Old Female CC:      Cold & URI symptoms x 1 week Height:     63.5 inches Weight:      150.50 pounds O2 Sat:      96 % O2 treatment:    Room Air Temp:     99.2 degrees F oral Pulse rate:   67 / minute Resp:     18 per minute BP sitting:   103 / 64  (left arm) Cuff size:   regular  Pt. in pain?   no  Vitals Entered By: Lajean Saver RN (September 19, 2010 12:11 PM)                   Updated Prior Medication List: DILTIAZEM HCL CR 240 MG XR24H-CAP (DILTIAZEM HCL) Take 1 tablet by mouth once a day VITAMIN C 1000 MG TABS (ASCORBIC ACID) Take 1 tablet by mouth once a day GLUCOSAMINE CHONDROITIN COMPLX  CAPS (GLUCOSAMINE-CHONDROIT-BIOFL-MN) Take 1 tablet by mouth once a day EQL COQ10 300 MG CAPS (COENZYME Q10) once a day * VITAMIN D 5000IU once a day PRADAXA 150 MG CAPS (DABIGATRAN ETEXILATE MESYLATE) one tablet two times a day COUMADIN 5 MG TABS (WARFARIN SODIUM) Sunday - 5 mg, Monday - 6 mg, Tuesday - 6 mg, Wednesday - 6 mg, Thursday - 5 mg, Friday - 6 mg, Saturday - 6 mg COUMADIN 6 MG TABS (WARFARIN SODIUM) Sunday - 5 mg, Monday - 6 mg, Tuesday - 6 mg, Wednesday - 6 mg, Thursday - 5 mg, Friday - 6 mg, Saturday - 6 mg  Current Allergies (reviewed today): ! SULFAHistory of Present Illness Chief Complaint: Cold & URI symptoms x 1 week History of Present Illness:  Subjective: Patient complains of URI symptoms that started one week ago. Minimal sore throat at present. + increasing cough No pleuritic pain + wheezing today + nasal congestion + post-nasal drainage + sinus pain/pressure No itchy/red eyes No earache No hemoptysis No SOB No fever/chills No nausea No vomiting No abdominal pain No diarrhea No skin rashes + fatigue No myalgias No headache    REVIEW OF SYSTEMS Constitutional Symptoms      Denies fever, chills, night sweats, weight loss, weight gain, and fatigue.  Eyes       Complains  of eye drainage and glasses.      Denies change in vision, eye pain, contact lenses, and eye surgery. Ear/Nose/Throat/Mouth       Complains of frequent runny nose and sinus problems.      Denies hearing loss/aids, change in hearing, ear pain, ear discharge, dizziness, frequent nose bleeds, sore throat, hoarseness, and tooth pain or bleeding.  Respiratory       Complains of dry cough and wheezing.      Denies productive cough, shortness of breath, asthma, bronchitis, and emphysema/COPD.      Comments: wet cough, not productive Cardiovascular       Denies murmurs, chest pain, and tires easily with exhertion.    Gastrointestinal       Denies stomach pain, nausea/vomiting, diarrhea, constipation, blood in bowel movements, and indigestion. Genitourniary       Denies painful urination, kidney stones, and loss of urinary control. Neurological       Denies paralysis, seizures, and fainting/blackouts. Musculoskeletal       Denies muscle pain, joint pain, joint stiffness, decreased range of motion, redness, swelling, muscle weakness, and gout.  Skin       Denies bruising, unusual mles/lumps or sores, and hair/skin or nail changes.  Psych       Denies mood changes, temper/anger issues, anxiety/stress, speech problems, depression, and sleep problems. Other Comments: Patient c/o cough, congestion, runny nose x 1 week. She became worse yesterday and began wheezing. She has taken Robitussin OTC. She just finished antibiotic series for a UTI   Past History:  Past Medical History: Reviewed history from 02/11/2010 and no changes required. ATRIAL FIBRILLATION (ICD-427.31) s/p ablation 2x on chronic coumadin therapy.   OSTEOPOROSIS (ICD-733.00) HERPES ZOSTER (ICD-053.9) Amiodarone discontinued previously due to ocular and pulmonary side effects  Past Surgical History: Reviewed history from 02/11/2010 and no changes required. Hysterectomy cholecystectomy Cardiac abaltion x 2   Family  History: Reviewed history from 02/11/2010 and no changes required. Mother,D,old age Father,D,Old age, stroke at 89 family history of atrial fibrillation No premature CAD  Social History: Reviewed history from 02/11/2010 and no changes required. Retired.  Some college. Married to her husband  Jonny Ruiz who has mild dementa and parkinsons.   Has 4 adult children. Lives with his daughter.   Former Smoker, quit 1961 Alcohol use-yes Drug use-no, 1-2 per week.  Regular exercise-yes   Objective:  No acute distress  Eyes:  Pupils are equal, round, and reactive to light and accomdation.  Extraocular movement is intact.  Conjunctivae are not inflamed.  Ears:  Canals normal.  Tympanic membranes normal.   Nose:  Normal septum.  Normal turbinates, mildly congested.  Maxillary sinus tenderness present.  Pharynx:  Normal  Neck:  Supple.  No adenopathy is present. Lungs:  Scattered bibasilar wheezes.  Breath sounds are equal.  Heart:  Regular rate and rhythm without murmurs, rubs, or gallops.  Abdomen:  Nontender without masses or hepatosplenomegaly.  Bowel sounds are present.  No CVA or flank tenderness.  Extremities:  No edema.   Assessment New Problems: ACUTE MAXILLARY SINUSITIS (ICD-461.0) BRONCHITIS, ACUTE (ICD-466.0)  URI WITH SINUSITIS  Plan New Medications/Changes: BENZONATATE 200 MG CAPS (BENZONATATE) One by mouth hs as needed cough  #12 x 0, 09/19/2010, Donna Christen MD AMOXICILLIN 875 MG TABS (AMOXICILLIN) One by mouth two times a day  #20 x 0, 09/19/2010, Donna Christen MD BENZONATATE 200 MG CAPS (BENZONATATE) One by mouth hs as needed cough  #12 x 0, 09/19/2010, Donna Christen MD AMOXICILLIN 875 MG TABS (AMOXICILLIN) One by mouth two times a day  #20 x 0, 09/19/2010, Donna Christen MD  New Orders: Est. Patient Level III [53664] Pulse Oximetry (single measurment) [40347] Planning Comments:   Begin amoxicillin, expectorant, topical decongestant, cough suppressant at bedtime.  Increase  fluid intake Followup with PCP if not improving one week.   The patient and/or caregiver has been counseled thoroughly with regard to medications prescribed including dosage, schedule, interactions, rationale for use, and possible side effects and they verbalize understanding.  Diagnoses and expected course of recovery discussed and will return if not improved as expected or if the condition worsens. Patient and/or caregiver verbalized understanding.  Prescriptions: BENZONATATE 200 MG CAPS (BENZONATATE) One by mouth hs as needed cough  #12 x 0   Entered and Authorized by:   Donna Christen MD   Signed by:   Donna Christen MD on 09/19/2010   Method used:   Print then Give to Patient   RxID:   [id number] AMOXICILLIN 875 MG TABS (AMOXICILLIN) One by mouth two times a day  #20 x 0   Entered and Authorized by:  Donna Christen MD   Signed by:   Donna Christen MD on 09/19/2010   Method used:   Print then Give to Patient   RxID:   [id number] BENZONATATE 200 MG CAPS (BENZONATATE) One by mouth hs as needed cough  #12 x 0   Entered and Authorized by:   Donna Christen MD   Signed by:   Donna Christen MD on 09/19/2010   Method used:   Print then Give to Patient   RxID:   [id number] AMOXICILLIN 875 MG TABS (AMOXICILLIN) One by mouth two times a day  #20 x 0   Entered and Authorized by:   Donna Christen MD   Signed by:   Donna Christen MD on 09/19/2010   Method used:   Print then Give to Patient   RxID:   [id number]   Patient Instructions: 1)  May use Mucinex  (guaifenesin) twice daily for congestion. 2)  Increase fluid intake, rest. 3)  May use Afrin nasal spray (or generic oxymetazoline) twice daily for about 5 days.  Also recommend using saline nasal spray several times daily and/or saline nasal irrigation. 4)  May use Vicks at bedtime. 5)  Followup with family doctor if not improving one week.   Orders Added: 1)  Est. Patient Level III [01027] 2)  Pulse Oximetry (single  measurment) [25366]

## 2010-11-17 NOTE — Assessment & Plan Note (Signed)
Summary: UTI   Vital Signs:  Patient profile:   77 year old female Weight:      145 pounds O2 Sat:      98 % on Room air Temp:     98.6 degrees F oral Pulse rate:   64 / minute BP sitting:   107 / 68  (right arm) Cuff size:   regular  Vitals Entered By: Duard Brady LPN (April 21, 6044 4:24 PM)  O2 Flow:  Room air CC: c/o ?UTI - freq and discomfort Is Patient Diabetic? No   Primary Care Provider:  [name] MD  CC:  c/o ?UTI - freq and discomfort.  History of Present Illness: 77  yo WF presents for urinary symptoms that started yesterday.  She reports frequent UTIs with the sensation of something being 'stuck' in her urinary tract.  Denies itching or burning.  She had a UTI treated 3 wks ago and reports 'clearing'.  She saw a urologist in the past for frequent UTIs.  Denies F/C, Nausea, vomitting or flank pain.  Denies pelvic pain.  She drank a lot of water today.  Denies incontinence, blood in the urine or vag bleeding (on coumadin), urgency or frequency.   Allergies: 1)  ! Sulfa  Past History:  Past Medical History: Reviewed history from 02/11/2010 and no changes required. ATRIAL FIBRILLATION (ICD-427.31) s/p ablation 2x on chronic coumadin therapy.   OSTEOPOROSIS (ICD-733.00) HERPES ZOSTER (ICD-053.9) Amiodarone discontinued previously due to ocular and pulmonary side effects  Social History: Reviewed history from 02/11/2010 and no changes required. Retired.  Some college. Married to her husband  Jonny Ruiz who has mild dementa and parkinsons.   Has 4 adult children. Lives with his daughter.   Former Smoker, quit 1961 Alcohol use-yes Drug use-no, 1-2 per week.  Regular exercise-yes  Review of Systems      See HPI  Physical Exam  General:  alert, well-developed, well-nourished, and well-hydrated.   Head:  normocephalic and atraumatic.   Eyes:  sclera non icteric Mouth:  pharynx pink and moist.   Neck:  no masses.   Lungs:  Normal respiratory  effort, chest expands symmetrically. Lungs are clear to auscultation, no crackles or wheezes. Heart:  irreg irreg w/o audible murmurs Abdomen:  soft, non-tender, normal bowel sounds, no distention, no masses, no guarding, no hepatomegaly, and no splenomegaly.   Extremities:  trace ankle edema bilat Skin:  color normal.  no jaundice   Impression & Recommendations:  Problem # 1:  UTI'S, RECURRENT (ICD-599.0) UA inconclusive today.  Sent for cx.  Empirically start cipro due to recurrent UTI hx.  Caution aboiut interaction w/ coumadin and f/u results in the next 48 hrs.  May need to get her back into urology for starting prophylactic therapy.  Will get a CMP today due to tea colored urine. Her updated medication list for this problem includes:    Ciprofloxacin Hcl 250 Mg Tabs (Ciprofloxacin hcl) .Marland Kitchen... 1 tab by mouth q 12 hrs x 3 days  Orders: T-Comprehensive Metabolic Panel ([id number]) T-Culture, Urine ([id number])  Complete Medication List: 1)  Diltiazem Hcl Cr 240 Mg Xr24h-cap (Diltiazem hcl) .... Take 1 tablet by mouth once a day 2)  Coumadin Check  .... Pt/inr up to once a week.   dx: atrial fibrillation 3)  Vitamin C 1000 Mg Tabs (Ascorbic acid) .... Take 1 tablet by mouth once a day 4)  Glucosamine Chondroitin Complx Caps (Glucosamine-chondroit-biofl-mn) .... Take 1 tablet by mouth once a day 5)  Eql Coq10 300 Mg Caps (Coenzyme q10) .... Once a day 6)  Vitamin D 5000iu  .... Once a day 7)  Coumadin 6 Mg Tabs (Warfarin sodium) .... Sunday - 6 mg, monday - 6 mg, tuesday - 6 mg, wednesday - 6 mg, thursday - 6 mg, friday - 6 mg, saturday - 6 mg 8)  Ciprofloxacin Hcl 250 Mg Tabs (Ciprofloxacin hcl) .... 1 tab by mouth q 12 hrs x 3 days  Other Orders: UA Dipstick w/o Micro (manual) (81002)  Patient Instructions: 1)  Start Cipro today. 2)  Will call you w/ urine cx results on Friday. 3)  CMP today. 4)  Will call you w/ results tomorrow. Prescriptions: CIPROFLOXACIN HCL 250 MG TABS  (CIPROFLOXACIN HCL) 1 tab by mouth q 12 hrs x 3 days  #6 tabs x 0   Entered and Authorized by:     DO   Signed by:     DO on 04/21/2010   Method used:   [facility]        [facility]  [address] [id number]ail[facility]       [address]       [place], [place]  [address]       Ph: [phone] or [phone]       Fax: [phone]   RxID:   [id number]     Laboratory Results   Urine Tests  Date/Time Received: April 21, 2010 4:31 PM  Date/Time Reported: April 21, 2010 4:31 PM   Routine Urinalysis   Color: straw Appearance: Clear Glucose: negative   (Normal Range: Negative) Bilirubin: negative   (Normal Range: Negative) Ketone: negative   (Normal Range: Negative) Spec. Gravity: >=1.030   (Normal Range: 1.003-1.035) Blood: negative   (Normal Range: Negative) pH: 5.0   (Normal Range: 5.0-8.0) Protein: trace   (Normal Range: Negative) Urobilinogen: 0.2   (Normal Range: 0-1) Nitrite: negative   (Normal Range: Negative) Leukocyte Esterace: negative   (Normal Range: Negative)

## 2010-11-17 NOTE — Progress Notes (Signed)
Summary: Triage call -[place] pt.  Phone Note Call from Patient   Caller: Patient Summary of Call: Pt. wants to know if she can leave a urine sample, because she thinks she has a UTI.Marland KitchenMarland KitchenMarland KitchenPlease call her as soon as possible, she has to go to [place] at 1:00.Marland KitchenMarland Kitchen# [id number] Initial call taken by: Michaelle Copas,  Mar 04, 2010 11:06 AM  Follow-up for Phone Call        oliguria, frequency, dysuria. pT WANTS TO COME BY fRIDAY AM TO LEAVE [place] IN [place] RIGHT NOW Follow-up by: [name],  Mar 04, 2010 11:26 AM

## 2010-11-17 NOTE — Progress Notes (Signed)
Summary: shingels vaccine  Phone Note Call from Patient   Caller: Patient Call For: [name] MD Summary of Call: pt would like to get a rx for the shingles vaccine.[facility] Initial call taken by: Avon Gully CMA, Duncan Dull),  June 02, 2010 9:34 AM    New/Updated Medications: ZOSTAVAX 14782 UNT/0.65ML SOLR (ZOSTER VACCINE LIVE) Inject IM single dose. Prescriptions: ZOSTAVAX 95621 UNT/0.65ML SOLR (ZOSTER VACCINE LIVE) Inject IM single dose.  #1 x 0   Entered and Authorized by:   [name] MD   Signed by:   [name] MD on 06/02/2010   Method used:   Printed then faxed to ...       [facility]  [address] [phone]* (retail)       [address] [place], [place]  [address]       Ph: [phone] or [phone]       Fax: [phone]   RxID:   [id number]

## 2010-11-17 NOTE — Progress Notes (Signed)
Summary: INR  Phone Note Call from Patient   Caller: Patient Summary of Call: PT is 33.8 INR is 3.43 Initial call taken by: Avon Gully CMA, Duncan Dull),  June 29, 2010 4:40 PM  Follow-up for Phone Call        Pt notified of new COumadin instructions and verbalized understanding Follow-up by: [name],  June 29, 2010 4:54 PM    New/Updated Medications: COUMADIN 6 MG TABS (WARFARIN SODIUM) Sunday - 6 mg, Monday - 5 mg, Tuesday - 6 mg, Wednesday - 6 mg, Thursday - 5 mg, Friday - 6 mg, Saturday - 6 mg   Anticoagulation Management History:      The patient is on coumadin and comes in today for a routine follow up visit.  She is being anticoagulated due to chronic atrial fibrillation.  Plans are to keep the patient on coumadin for life.  Her last INR was 2.70 and today's INR is 3.43.    Anticoagulation Management Assessment/Plan:      The target INR is 2.0-3.0.  She is to have a PT/INR in 1 week.  Anticoagulation instructions were given to patient.         Current Anticoagulation Instructions: The patient is to hold (do not take) the Monday dose of coumadin.  The dosage to be resumed includes: Coumadin 6 mg tabs and Coumadin 6 mg tabs:  Sunday - 6 mg, Monday - 6 mg, Tuesday - 6 mg, Wednesday - 6 mg, Thursday - 6 mg, Friday - 6 mg, Saturday - 6 mg.  Repeat PT/INR in 1 week.

## 2010-11-17 NOTE — Progress Notes (Signed)
Summary: Rx for PT/INR  Phone Note Call from Patient Call back at Home Phone [phone]   Caller: Patient Summary of Call: pt needs rx for PT/INr so that she can have her blood drawn when she is out of town  for 10weeks- Rx for 3 months Initial call taken by: [name],  December 10, 2009 3:38 PM    Prescriptions: COUMADIN CHECK PT/INR up to once a week.   Dx: Atrial fibrillation  #6 months x 0   Entered and Authorized by:   [name] MD   Signed by:   [name] MD on 12/10/2009   Method used:   Print then Give to Patient   RxID:   [id number]

## 2010-11-17 NOTE — Miscellaneous (Signed)
Summary: Flu Shot/[place] Kathryne Sharper  Flu Shot/[place] Kathryne Sharper   Imported By: Lanelle Bal 08/19/2009 11:44:20  _____________________________________________________________________  External Attachment:    Type:   Image     Comment:   External Document

## 2010-11-17 NOTE — Assessment & Plan Note (Signed)
Summary: vag discharge   Vital Signs:  Patient profile:   77 year old female Height:      63.5 inches Weight:      145 pounds BMI:     25.37 O2 Sat:      97 % on Room air Temp:     98.2 degrees F oral Pulse rate:   67 / minute BP sitting:   98 / 63  (left arm) Cuff size:   regular  Vitals Entered By: Payton Spark CMA (April 08, 2010 10:17 AM)  O2 Flow:  Room air CC: Brown bloody discharge is underwear on Monday evening. Nothing since, no pain or any other Sxs.    Primary Care Provider:  [name] MD  CC:  Manson Passey bloody discharge is underwear on Monday evening. Nothing since and no pain or any other Sxs. .  History of Present Illness: 77 yo WF presents for a blood vaginal discharge that occured Monday night for the first time ever.  No pain or previous intercourse.  She has been post menopausal since age 74, never on HRT.  She has had some pain with defecation.  She has hx of hemorrhoids.   She had a normal colonoscopy about 4 yrs ago in [place].  She is on coumadin.  Denies vaginal pain or itching.  No abdominal or pelvic pain.  Has a Gyn.  Going for her pap smear in July but has had a Vaginal hysterectomy (w/o oophorectomy) in the past.   Denies any dysuria or urgency.  Current Medications (verified): 1)  Diltiazem Hcl Cr 240 Mg Xr24h-Cap (Diltiazem Hcl) .... Take 1 Tablet By Mouth Once A Day 2)  Coumadin Check .... Pt/inr Up To Once A Week.   Dx: Atrial Fibrillation 3)  Vitamin C 1000 Mg Tabs (Ascorbic Acid) .... Take 1 Tablet By Mouth Once A Day 4)  Glucosamine Chondroitin Complx  Caps (Glucosamine-Chondroit-Biofl-Mn) .... Take 1 Tablet By Mouth Once A Day 5)  Eql Coq10 300 Mg Caps (Coenzyme Q10) .... Once A Day 6)  Vitamin D 5000iu .... Once A Day 7)  Coumadin 5 Mg Tabs (Warfarin Sodium) .... Sunday - 6 Mg, Monday - 5 Mg, Tuesday - 6 Mg, Wednesday - 6 Mg, Thursday - 6 Mg, Friday - 5 Mg, Saturday - 6 Mg 8)  Coumadin 1 Mg Tabs (Warfarin Sodium) .... Sunday - 6 Mg,  Monday - 5 Mg, Tuesday - 6 Mg, Wednesday - 6 Mg, Thursday - 6 Mg, Friday - 5 Mg, Saturday - 6 Mg  Allergies (verified): 1)  ! Sulfa  Past History:  Past Medical History: Reviewed history from 02/11/2010 and no changes required. ATRIAL FIBRILLATION (ICD-427.31) s/p ablation 2x on chronic coumadin therapy.   OSTEOPOROSIS (ICD-733.00) HERPES ZOSTER (ICD-053.9) Amiodarone discontinued previously due to ocular and pulmonary side effects  Past Surgical History: Reviewed history from 02/11/2010 and no changes required. Hysterectomy cholecystectomy Cardiac abaltion x 2   Family History: Reviewed history from 02/11/2010 and no changes required. Mother,D,old age Father,D,Old age, stroke at 19 family history of atrial fibrillation No premature CAD  Social History: Reviewed history from 02/11/2010 and no changes required. Retired.  Some college. Married to her husband  Jonny Ruiz who has mild dementa and parkinsons.   Has 4 adult children. Lives with his daughter.   Former Smoker, quit 1961 Alcohol use-yes Drug use-no, 1-2 per week.  Regular exercise-yes  Review of Systems      See HPI  Physical Exam  General:  alert, well-developed, well-nourished,  and well-hydrated.   Rectal:  No external abnormalities noted. Normal sphincter tone. No rectal masses or tenderness. Genitalia:  atrophic vaginitis with normal introitus.  No lesions on the labia or vaginal walls.  No visible discharge.  wet prep obtained. Skin:  no pallor   Impression & Recommendations:  Problem # 1:  POSTMENOPAUSAL BLEEDING (ICD-627.1) Scant bloody vaginal discharge x 1 occurence 2 days ago, 29 yrs postmenopausal and 4 yrs s/p vaginal hysterectomy for prolapse, not on HRT.  Normal UA (neg for blood).  Normal exam findings.  WP sent off for hx of discharge.  If recurs, will get her in with her gyn.  She is concerned that maybe this was a rectal source.  Had a normal colonoscopy in [place] about 4 yrs ago (other than  diverticulosis) but is on coumadin so is higher risk for bleeding.  Will refer her to GI to see if she is even due for any further screening.  Complete Medication List: 1)  Diltiazem Hcl Cr 240 Mg Xr24h-cap (Diltiazem hcl) .... Take 1 tablet by mouth once a day 2)  Coumadin Check  .... Pt/inr up to once a week.   dx: atrial fibrillation 3)  Vitamin C 1000 Mg Tabs (Ascorbic acid) .... Take 1 tablet by mouth once a day 4)  Glucosamine Chondroitin Complx Caps (Glucosamine-chondroit-biofl-mn) .... Take 1 tablet by mouth once a day 5)  Eql Coq10 300 Mg Caps (Coenzyme q10) .... Once a day 6)  Vitamin D 5000iu  .... Once a day 7)  Coumadin 5 Mg Tabs (Warfarin sodium) .... Sunday - 6 mg, monday - 5 mg, tuesday - 6 mg, wednesday - 6 mg, thursday - 6 mg, friday - 5 mg, saturday - 6 mg 8)  Coumadin 1 Mg Tabs (Warfarin sodium) .... Sunday - 6 mg, monday - 5 mg, tuesday - 6 mg, wednesday - 6 mg, thursday - 6 mg, friday - 5 mg, saturday - 6 mg  Other Orders: T-Wet Prep by [facility] [phone]) UA Dipstick w/o Micro (automated)  (81003) Gastroenterology Referral (GI)  Patient Instructions: 1)  UA normal. 2)  Wet Prep will be back tomorrow. 3)  Will call you w/ results. 4)  For any further bleeding, will refer you to GI or back to your gyn doctor.  Laboratory Results   Urine Tests    Routine Urinalysis   Color: yellow Appearance: Clear Glucose: negative   (Normal Range: Negative) Bilirubin: negative   (Normal Range: Negative) Ketone: negative   (Normal Range: Negative) Spec. Gravity: 1.025   (Normal Range: 1.003-1.035) Blood: negative   (Normal Range: Negative) pH: 6.0   (Normal Range: 5.0-8.0) Protein: negative   (Normal Range: Negative) Urobilinogen: 0.2   (Normal Range: 0-1) Nitrite: negative   (Normal Range: Negative) Leukocyte Esterace: trace   (Normal Range: Negative)

## 2010-11-17 NOTE — Assessment & Plan Note (Signed)
Summary: [facility]   Visit Type:  3 months follow up Primary Provider:  [name] MD  CC:  No cardiac complains.  History of Present Illness: 77 year old female I saw in April of 2011 for evaluation of atrial fibrillation. Previously cared for in [place]. Carotid Dopplers in July of 2008 showed no significant carotid stenosis. Abdominal ultrasound showed no aneurysm in November of 2008. Myoview performed on Feb 19, 2009 showed an ejection fraction of 57%. The perfusion was normal. Echocardiogram in May 2010 showed normal LV function, mild LVH. She had pulmonary vein isolation procedure in November of 2001 and 2004. She apparently had recurrent episodes of atrial fibrillation following the procedure. Since April the patient has dyspnea with more extreme activities but not with routine activities. It is relieved with rest. It is not associated with chest pain. There is no orthopnea, PND or pedal edema. There is no syncope or palpitations. There is no exertional chest pain. Note she recently noticed mild hematochezia by her report. She has seen gastroenterology and is scheduled for a colonoscopy.   Current Medications (verified): 1)  Diltiazem Hcl Cr 240 Mg Xr24h-Cap (Diltiazem Hcl) .... Take 1 Tablet By Mouth Once A Day 2)  Coumadin Check .... Pt/inr Up To Once A Week.   Dx: Atrial Fibrillation 3)  Vitamin C 1000 Mg Tabs (Ascorbic Acid) .... Take 1 Tablet By Mouth Once A Day 4)  Glucosamine Chondroitin Complx  Caps (Glucosamine-Chondroit-Biofl-Mn) .... Take 1 Tablet By Mouth Once A Day 5)  Eql Coq10 300 Mg Caps (Coenzyme Q10) .... Once A Day 6)  Vitamin D 5000iu .... Once A Day 7)  Coumadin 6 Mg Tabs (Warfarin Sodium) .... Sunday - 6 Mg, Monday - 6 Mg, Tuesday - 6 Mg, Wednesday - 6 Mg, Thursday - 6 Mg, Friday - 6 Mg, Saturday - 6 Mg  Allergies: 1)  ! Sulfa  Past History:  Past Medical History: Reviewed history from 02/11/2010 and no changes required. ATRIAL FIBRILLATION  (ICD-427.31) s/p ablation 2x on chronic coumadin therapy.   OSTEOPOROSIS (ICD-733.00) HERPES ZOSTER (ICD-053.9) Amiodarone discontinued previously due to ocular and pulmonary side effects  Past Surgical History: Reviewed history from 02/11/2010 and no changes required. Hysterectomy cholecystectomy Cardiac abaltion x 2   Social History: Reviewed history from 02/11/2010 and no changes required. Retired.  Some college. Married to her husband  [name] who has mild dementa and parkinsons.   Has 4 adult children. Lives with his daughter.   Former Smoker, quit 1961 Alcohol use-yes Drug use-no, 1-2 per week.  Regular exercise-yes  Review of Systems       Recent mild hematochezia but no fevers or chills, productive cough, hemoptysis, dysphasia, odynophagia, melena,  dysuria, hematuria, rash, seizure activity, orthopnea, PND, pedal edema, claudication. Remaining systems are negative.   Vital Signs:  Patient profile:   77 year old female Height:      63.5 inches Weight:      145.50 pounds BMI:     25.46 Pulse rate:   60 / minute Pulse rhythm:   regular Resp:     18  per minute BP sitting:   104 / 60  (left arm) Cuff size:   large  Vitals Entered By: Vikki Ports (April 29, 2010 12:09 PM)  Physical Exam  General:  Well-developed well-nourished in no acute distress.  Skin is warm and dry.  HEENT is normal.  Neck is supple. No thyromegaly.  Chest is clear to auscultation with normal expansion.  Cardiovascular exam is regular  rate and rhythm.  Abdominal exam nontender or distended. No masses palpated. Extremities show no edema; varicosities noted neuro grossly intact    Impression & Recommendations:  Problem # 1:  ATRIAL FIBRILLATION (ICD-427.31) Patient remains in sinus rhythm. Continue Cardizem and Coumadin. She does not have a history of an embolic event. She would also like to take pradaxa. I have asked her to discontinue her Coumadin 5 days before her colonoscopy. 3 days  later she will begin pradaxa at 150 mg p.o. b.i.d. We will follow her renal function closely in the future. Her GFR is now greater than 30. Her updated medication list for this problem includes:    Coumadin 6 Mg Tabs (Warfarin sodium) ..... Sunday - 6 mg, monday - 6 mg, tuesday - 6 mg, wednesday - 6 mg, thursday - 6 mg, friday - 6 mg, saturday - 6 mg  Her updated medication list for this problem includes:    Coumadin 6 Mg Tabs (Warfarin sodium) ..... Sunday - 6 mg, monday - 6 mg, tuesday - 6 mg, wednesday - 6 mg, thursday - 6 mg, friday - 6 mg, saturday - 6 mg  Problem # 2:  COUMADIN THERAPY (ICD-V58.61) As per above.  Problem # 3:  HEMATOCHEZIA (ICD-578.1) Evaluation per gastroenterology.  Patient Instructions: 1)  Your physician recommends that you schedule a follow-up appointment in: 6 months 2)  Your physician has recommended you make the following change in your medication: STOP COUMADIN 5 DAYS PRIOR TO COLONOSCOPY THEN THREE DAYS AFTER THE COLONOSCOPY START PRADAXA 150MG ONE TABLET TWICE DAILY Prescriptions: PRADAXA 150 MG CAPS (DABIGATRAN ETEXILATE MESYLATE) one tablet two times a day  #30 x 12   Entered by:   Debra Mathis, RN   Authorized by:    Saunders , MD, FACC   Signed by:   Debra Mathis, RN on 04/29/2010   Method used:   Electronically to        [facility]  [address] [id number] (ret[facility])       [address]       [place], [place]  [address]       Ph: [phone] or [phone]       Fax: [phone]   RxID:   [id number]

## 2010-11-17 NOTE — Assessment & Plan Note (Signed)
Summary: [facility]   Visit Type:  Initial Consult Primary Provider:  [name] MD  CC:  Cardiology consult- Atrial fibrillation.  History of Present Illness: 77 year old female for evaluation of atrial fibrillation. Previously cared for in [place]. Carotid Dopplers in July of 2008 showed no significant carotid stenosis. Abdominal ultrasound showed no aneurysm in November of 2008. Myoview performed on Feb 19, 2009 showed an ejection fraction of 57%. The perfusion was normal. Echocardiogram in May 2010 showed normal LV function, mild LVH. She had pulmonary vein isolation procedure in November of 2001 and 2004. She apparently had recurrent episodes of atrial fibrillation following the procedure. She recently moved to this area and presents to establish. She does not have chest pain. She has dyspnea with more extreme activities but not with routine activities. It is relieved with rest. There is no orthopnea, PND or pedal edema. She does have occasional episodes of atrial fibrillation. These occurr approximately 4 to 5 times per year. It is described as her heart racing and fatigue but no shortness of breath or chest pain. There is no bleeding. Because of her atrial fibrillation as to further evaluate.  Current Medications (verified): 1)  Diltiazem Hcl Cr 240 Mg Xr24h-Cap (Diltiazem Hcl) .... Take 1 Tablet By Mouth Once A Day 2)  Coumadin Check .... Pt/inr Up To Once A Week.   Dx: Atrial Fibrillation 3)  Vitamin C 1000 Mg Tabs (Ascorbic Acid) .... Take 1 Tablet By Mouth Once A Day 4)  Glucosamine Chondroitin Complx  Caps (Glucosamine-Chondroit-Biofl-Mn) .... Take 1 Tablet By Mouth Once A Day 5)  Fish Oil 1000 Mg Caps (Omega-3 Fatty Acids) .... Take One Tablet By Mouth Once A Day 6)  Eql Coq10 300 Mg Caps (Coenzyme Q10) .... Once A Day 7)  Lutein 20 Mg Caps (Lutein) .... Take One Tablet By Mouth Once A Day 8)  Warfarin Sodium 5 Mg Tabs (Warfarin Sodium) .... Use As Directed By  Anticoagulation Clinic 9)  Warfarin Sodium 1 Mg Tabs (Warfarin Sodium) .... Use As Directed By Anticoagualtion Clinic 10)  Vitamin D 5000iu .... Once A Day  Allergies (verified): 1)  ! Sulfa  Past History:  Past Medical History: ATRIAL FIBRILLATION (ICD-427.31) s/p ablation 2x on chronic coumadin therapy.   OSTEOPOROSIS (ICD-733.00) HERPES ZOSTER (ICD-053.9) Amiodarone discontinued previously due to ocular and pulmonary side effects  Past Surgical History: Hysterectomy cholecystectomy Cardiac abaltion x 2   Family History: Reviewed history from 05/13/2009 and no changes required. Mother,D,old age Father,D,Old age, stroke at 69 family history of atrial fibrillation No premature CAD  Social History: Reviewed history from 12/10/2009 and no changes required. Retired.  Some college. Married to her husband  Jonny Ruiz who has mild dementa and parkinsons.   Has 4 adult children. Lives with his daughter.   Former Smoker, quit 1961 Alcohol use-yes Drug use-no, 1-2 per week.  Regular exercise-yes  Review of Systems       Some arthralgias but no fevers or chills, productive cough, hemoptysis, dysphasia, odynophagia, melena, hematochezia, dysuria, hematuria, rash, seizure activity, orthopnea, PND, pedal edema, claudication. Remaining systems are negative.   Vital Signs:  Patient profile:   77 year old female Height:      63.5 inches Weight:      148.25 pounds BMI:     25.94 Pulse rate:   79 / minute Pulse rhythm:   regular Resp:     18 per minute BP sitting:   118 / 68  (left arm) Cuff size:  large  Vitals Entered By: Vikki Ports (February 11, 2010 2:26 PM)  Physical Exam  General:  Well developed/well nourished in NAD Skin warm/dry Patient not depressed No peripheral clubbing Back-normal HEENT-normal/normal eyelids Neck supple/normal carotid upstroke bilaterally; no bruits; no JVD; no thyromegaly chest - CTA/ normal expansion CV - RRR/normal S1 and S2; no murmurs, rubs  or gallops;  PMI nondisplaced Abdomen -NT/ND, no HSM, no mass, + bowel sounds, no bruit 2+ femoral pulses, no bruits Ext-no edema, chords, 2+ DP; varicosities noted. Neuro-grossly nonfocal     EKG  Procedure date:  02/11/2010  Findings:      Sinus rhythm with interpolated PVCs. RV conduction delay. No ST changes.  Impression & Recommendations:  Problem # 1:  ATRIAL FIBRILLATION (ICD-427.31) Continue Cardizem. Embolic risk factors of age > 43 and female sex. Continue coumadin with goal INR of 2-3. She would like Pradaxa if her insurance covers; she will check about this. I will check a bmet to make sure renal insufficiency is not an issue and contraindication. If more frequent episodes in the future, will consider antiarrhythmic. Note previous side effects with amiodarone. The following medications were removed from the medication list:    Coumadin 6 Mg Tabs (Warfarin sodium) ..... Sunday - 6 mg, monday - 5 mg, tuesday - 6 mg, wednesday - 5 mg, thursday - 6 mg, friday - 5 mg, saturday - 6 mg Her updated medication list for this problem includes:    Warfarin Sodium 5 Mg Tabs (Warfarin sodium) ..... Use as directed by anticoagulation clinic    Warfarin Sodium 1 Mg Tabs (Warfarin sodium) ..... Use as directed by anticoagualtion clinic  Orders: T-Basic Metabolic Panel [id number]) T-CBC No Diff ([id number])  Problem # 2:  OSTEOPOROSIS (ICD-733.00)  Problem # 3:  COUMADIN THERAPY (ICD-V58.61) Followed by primary care.  Patient Instructions: 1)  Your physician recommends that you schedule a follow-up appointment in: 3 MONTHS  2)  PRAXADA 150MG  TWICE DAILY

## 2010-11-19 NOTE — Assessment & Plan Note (Signed)
Summary: f/u ED visit for chest pain   Vital Signs:  Patient profile:   77 year old female Height:      63.5 inches Weight:      151 pounds Pulse rate:   78 / minute BP sitting:   115 / 69  (right arm) Cuff size:   regular  Vitals Entered By: Avon Gully CMA, Duncan Dull) (September 28, 2010 9:17 AM) CC: 2 opinion on chest x-ray   Primary Care Provider:  [name] MD  CC:  2 opinion on chest x-ray.  History of Present Illness: Went to [facility] for Chest pain on 08/17/2010.  Had a neg w/u.  [place] in [place] had spirometry adn saw pulm and told had scar tissue at hte bottom of her lungs. 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 She has had nor more CP episodes since then . At d/c told likely GERD.  She was told to have a f/u CXR adn she brought in a copy of that report . The CXR showed soem density at the bases, but also showed a rounded nodular ensity in teh rgith hilum measuring 15 mm. Could represent pulm artery of soft tissue nodule.   Current Medications (verified): 1)  Diltiazem Hcl Cr 240 Mg Xr24h-Cap (Diltiazem Hcl) .... Take 1 Tablet By Mouth Once A Day 2)  Vitamin C 1000 Mg Tabs (Ascorbic Acid) .... Take 1 Tablet By Mouth Once A Day 3)  Glucosamine Chondroitin Complx  Caps (Glucosamine-Chondroit-Biofl-Mn) .... Take 1 Tablet By Mouth Once A Day 4)  Eql Coq10 300 Mg Caps (Coenzyme Q10) .... Once A Day 5)  Vitamin D 5000iu .... Once A Day 6)  Coumadin 5 Mg Tabs (Warfarin Sodium) .... Sunday - 5 Mg, Monday - 6 Mg, Tuesday - 6 Mg, Wednesday - 6 Mg, Thursday - 5 Mg, Friday - 6 Mg, Saturday - 6 Mg 7)  Coumadin 6 Mg Tabs (Warfarin Sodium) .... Sunday - 5 Mg, Monday - 6 Mg, Tuesday - 6 Mg, Wednesday - 6 Mg, Thursday - 5 Mg, Friday - 6 Mg, Saturday - 6 Mg  Allergies (verified): 1)  ! Sulfa  Comments:  Nurse/Medical Assistant: The patient's medications and allergies were reviewed with the patient and were updated in the Medication and Allergy Lists. Avon Gully CMA, Duncan Dull) (September 28, 2010 9:18 AM)  Past History:  Past Medical History: Last updated: 02/11/2010 ATRIAL FIBRILLATION (ICD-427.31) s/p ablation 2x on chronic coumadin therapy.   OSTEOPOROSIS (ICD-733.00) HERPES ZOSTER (ICD-053.9) Amiodarone discontinued previously due to ocular and pulmonary side effects  Social History: Retired.  Some college. Married to her husband  Jonny Ruiz who has mild alzheimers  and parkinsons.   Has 4 adult children. Lives with his daughter.   Former Smoker, quit 1961 Alcohol use-yes Drug use-no, 1-2 per week.  Regular exercise-yes  Physical Exam  General:  Well-developed,well-nourished,in no acute distress; alert,appropriate and cooperative throughout examination Lungs:  Normal respiratory effort, chest expands symmetrically. Lungs are clear to auscultation, no crackles or wheezes. Heart:  Normal rate and regular rhythm. S1 and S2 normal without gallop, murmur, click, rub or other extra sounds.   Impression & Recommendations:  Problem # 1:  OTH NONSPC ABN FINDNG RAD&OTH EXM BODY STRUCTURE (ICD-793.99) Discussed recommend repeat CXR thias month.  If any changes then can eval later with Chest Ct. She is currently asymtomatic. She is a former smoker but quit 40 years ago.   Orders: T-Chest x-ray, 2 views (71020)  Complete Medication List: 1)  Diltiazem Hcl Cr 240 Mg Xr24h-cap (Diltiazem hcl) .... Take 1 tablet by mouth once a day 2)  Vitamin C 1000 Mg Tabs (Ascorbic acid) .... Take 1 tablet by mouth once a day 3)  Glucosamine Chondroitin Complx Caps (Glucosamine-chondroit-biofl-mn) .... Take 1 tablet by mouth once a day 4)  Eql Coq10 300 Mg Caps (Coenzyme q10) .... Once a day 5)  Vitamin D 5000iu  .... Once a day 6)  Coumadin 5 Mg Tabs (Warfarin sodium) .... Sunday - 5 mg, monday - 6 mg, tuesday - 6 mg, wednesday - 6 mg, thursday - 5 mg, friday - 6 mg, saturday - 6 mg 7)  Coumadin 6 Mg Tabs (Warfarin sodium) .... Sunday - 5 mg, monday - 6 mg,  tuesday - 6 mg, wednesday - 6 mg, thursday - 5 mg, friday - 6 mg, saturday - 6 mg  Patient Instructions: 1)  Can go anytime for Xray. We will call with the results when we get them back.    Orders Added: 1)  T-Chest x-ray, 2 views [71020] 2)  Est. Patient Level III [78295]

## 2010-11-19 NOTE — Progress Notes (Signed)
Summary: Husbands hypersexuality  Phone Note Call from Patient   Summary of Call: Call from his wife about his hypersexual behavior. She mentioned that previous MD had discussed medications that could help with this.  Called adn spoke with Dr. Steward Drone at the memory assessment Center at [facility]. She recommend trial of sertraline and if that failed one of the androgen suppressive drugs used in Prostate Ca patients.  Initial call taken by: [name] MD,  September 28, 2010 2:20 PM

## 2010-11-19 NOTE — Letter (Signed)
Summary: [facility]  [facility]   Imported By: Lanelle Bal 10/02/2010 11:40:18  _____________________________________________________________________  External Attachment:    Type:   Image     Comment:   External Document

## 2010-11-19 NOTE — Progress Notes (Signed)
  Records faxed to [facility] @ [phone] Attn Dr.[name] Powers Cala Bradford Mesiemore  October 09, 2010 9:30 AM

## 2010-11-19 NOTE — Progress Notes (Signed)
Summary: CXR results.   Phone Note Outgoing Call   Summary of Call: CXR show no changes in nodular density. Let repeat Xray in one year.  Initial call taken by: [name] MD,  October 16, 2010 2:04 PM  Follow-up for Phone Call        pt notified Follow-up by: Avon Gully CMA, Duncan Dull),  October 16, 2010 4:47 PM

## 2010-11-27 ENCOUNTER — Encounter: Payer: Self-pay | Admitting: Family Medicine

## 2010-12-10 ENCOUNTER — Encounter: Payer: Self-pay | Admitting: Family Medicine

## 2010-12-10 LAB — CONVERTED CEMR LAB: Prothrombin Time: 31.9 s — ABNORMAL HIGH (ref 11.6–15.2)

## 2010-12-15 NOTE — Consult Note (Signed)
Summary: [facility]  [facility]   Imported By: Maryln Gottron 12/07/2010 12:43:19  _____________________________________________________________________  External Attachment:    Type:   Image     Comment:   External Document

## 2010-12-18 ENCOUNTER — Encounter: Payer: Self-pay | Admitting: Family Medicine

## 2010-12-19 LAB — CONVERTED CEMR LAB: INR: 2.04 — ABNORMAL HIGH (ref <1.50)

## 2011-01-04 ENCOUNTER — Encounter: Payer: Self-pay | Admitting: Family Medicine

## 2011-01-04 LAB — CONVERTED CEMR LAB: INR: 2.37

## 2011-02-04 ENCOUNTER — Other Ambulatory Visit: Payer: Self-pay | Admitting: Family Medicine

## 2011-02-08 ENCOUNTER — Ambulatory Visit (INDEPENDENT_AMBULATORY_CARE_PROVIDER_SITE_OTHER): Payer: Medicare Other | Admitting: Family Medicine

## 2011-02-08 DIAGNOSIS — R21 Rash and other nonspecific skin eruption: Secondary | ICD-10-CM

## 2011-02-08 MED ORDER — DOXYCYCLINE HYCLATE 100 MG PO TABS: 100.0000 mg | ORAL_TABLET | Freq: Two times a day (BID) | ORAL | Status: AC

## 2011-02-08 MED ORDER — AMBULATORY NON FORMULARY MEDICATION: Status: DC

## 2011-02-08 NOTE — Patient Instructions (Signed)
Call if the rash doesn't resolve.

## 2011-02-08 NOTE — Progress Notes (Signed)
  Subjective:    Patient ID: Terri Small, female    DOB: 08-21-1931, 77 y.o.   MRN: [id number]  HPI 7 days of red rash on the right breast.  It is not smaller but the iching has decreased.  No creams or treatments. She has never had anything like this before. She thought initially it was just about bite. Because the reddening has not decreased his part of the reason she came in today. She has not had any other breast pain or discomfort. Her last mammogram was approximately a year and a half ago and was normal.  She also needs a standing order for her INR checks as she is going out of town to [place] for approximately 2 months.  Review of Systems     Objective:   Physical Exam  Pulmonary/Chest:            Assessment & Plan:  Skin rash on her right breast. Certainly this could be a localized reaction to a bug bite. It doesn't quite have the distinct look for cellulitis does plan to go ahead and treat her with doxycycline to see if it significantly improves in the next 3-5 days. If it does not we can certainly consider further evaluation with a mammogram of her breast. She is certainly overdue anyway. If the redness spreads or becomes more painful or tender then she is to call immediately. It does not appear to be a contact dermatitis, as there is no dried scale.  Anticoagulation-she was given a prescription for standing order for PT and INR checks.

## 2011-02-09 ENCOUNTER — Encounter: Payer: Self-pay | Admitting: Family Medicine

## 2011-02-11 ENCOUNTER — Telehealth: Payer: Self-pay | Admitting: *Deleted

## 2011-02-11 DIAGNOSIS — R21 Rash and other nonspecific skin eruption: Secondary | ICD-10-CM

## 2011-02-11 NOTE — Telephone Encounter (Signed)
Terri Small from the [facility] called with a question regarding diagnosis of rash for mammogram.  Reviewed chart and advised there is a "red erythematous rash" noted on right breast.  Terri Small is unsure if insurance will pay for diagnosis of rash.  Would it be appropriate to call the area a lesion or is rash more appropriate.  Please advise. Thanks.

## 2011-02-11 NOTE — Telephone Encounter (Signed)
Pt called this am and states he breast is not getting better and wants the mammo done

## 2011-02-11 NOTE — Telephone Encounter (Signed)
Ok put in for diagnostic mammo.   We will schedule it.

## 2011-02-11 NOTE — Telephone Encounter (Signed)
Ok. Lets say lesion. She is due for her screening but happens to have a rash on the breast to so felt it would be better to order a diagnostic.

## 2011-02-12 ENCOUNTER — Other Ambulatory Visit: Payer: Self-pay | Admitting: Family Medicine

## 2011-02-12 ENCOUNTER — Ambulatory Visit: Payer: Self-pay | Admitting: Family Medicine

## 2011-02-12 ENCOUNTER — Telehealth: Payer: Self-pay | Admitting: *Deleted

## 2011-02-12 NOTE — Telephone Encounter (Signed)
REturn call to [facility], she states that she was able to use rash diagnosis and appt has been scheduled.

## 2011-02-12 NOTE — Telephone Encounter (Signed)
Left message on pt's vm with coumadin instructions

## 2011-02-16 ENCOUNTER — Other Ambulatory Visit: Payer: Self-pay | Admitting: Family Medicine

## 2011-02-16 ENCOUNTER — Ambulatory Visit
Admission: RE | Admit: 2011-02-16 | Discharge: 2011-02-16 | Disposition: A | Discharge status: Home / Self Care | Payer: Medicare Other | Source: Ambulatory Visit | Attending: Family Medicine | Admitting: Family Medicine

## 2011-02-16 ENCOUNTER — Telehealth: Payer: Self-pay | Admitting: Family Medicine

## 2011-02-16 ENCOUNTER — Ambulatory Visit
Admission: RE | Admit: 2011-02-16 | Discharge: 2011-02-16 | Disposition: A | Discharge status: Home / Self Care | Payer: 59 | Source: Ambulatory Visit | Attending: Family Medicine | Admitting: Family Medicine

## 2011-02-16 DIAGNOSIS — R21 Rash and other nonspecific skin eruption: Secondary | ICD-10-CM

## 2011-02-16 DIAGNOSIS — N6310 Unspecified lump in the right breast, unspecified quadrant: Secondary | ICD-10-CM

## 2011-02-16 NOTE — Telephone Encounter (Signed)
Pt.notified

## 2011-02-16 NOTE — Telephone Encounter (Signed)
Call pt: Mammo is normal. Repeat in 1-2 years. Let us know if rash not resolving in the next 2 weeks.

## 2011-03-04 LAB — PROTIME-INR: INR: 2.6 — AB (ref 0.9–1.1)

## 2011-03-05 ENCOUNTER — Telehealth: Payer: Self-pay | Admitting: Family Medicine

## 2011-03-05 ENCOUNTER — Ambulatory Visit: Payer: Self-pay | Admitting: Family Medicine

## 2011-03-05 NOTE — Telephone Encounter (Signed)
Pt notified there is no change in coumadin dose and to repeat in 4 weeks

## 2011-03-05 NOTE — Telephone Encounter (Signed)
Patient called advised if you can call her cell phone [phone] for her results she is out of town.

## 2011-03-16 ENCOUNTER — Other Ambulatory Visit: Payer: Self-pay | Admitting: *Deleted

## 2011-03-16 ENCOUNTER — Telehealth: Payer: Self-pay | Admitting: Family Medicine

## 2011-03-16 MED ORDER — WARFARIN SODIUM 5 MG PO TABS: 5.0000 mg | ORAL_TABLET | Freq: Every day | ORAL | Status: DC

## 2011-03-19 NOTE — Telephone Encounter (Signed)
Closed encounter. [name], LPN /Triage  

## 2011-04-08 ENCOUNTER — Other Ambulatory Visit: Payer: Self-pay | Admitting: Family Medicine

## 2011-04-08 ENCOUNTER — Ambulatory Visit: Payer: Self-pay | Admitting: Family Medicine

## 2011-04-08 LAB — PROTIME-INR: INR: 2.55 — ABNORMAL HIGH (ref <1.50)

## 2011-04-13 ENCOUNTER — Telehealth: Payer: Self-pay | Admitting: Family Medicine

## 2011-04-13 NOTE — Telephone Encounter (Signed)
Pt called for lab results of her coumadin test from 04-08-11.  Pt informed of her new coumadin regimen. Pt said she will repeat in 4 weeks. Jarvis Newcomer, LPN [facility]

## 2011-05-06 ENCOUNTER — Telehealth: Payer: Self-pay | Admitting: Family Medicine

## 2011-05-06 ENCOUNTER — Ambulatory Visit: Payer: Self-pay | Admitting: Family Medicine

## 2011-05-06 ENCOUNTER — Other Ambulatory Visit: Payer: Self-pay | Admitting: Family Medicine

## 2011-05-06 LAB — PROTIME-INR: Prothrombin Time: 22.2 seconds — ABNORMAL HIGH (ref 11.6–15.2)

## 2011-05-06 NOTE — Telephone Encounter (Signed)
Pt advised of INR result and no change in meds and to repeat in 4wk.

## 2011-05-10 ENCOUNTER — Ambulatory Visit: Payer: Medicare Other | Attending: Family Medicine | Admitting: Physical Therapy

## 2011-05-10 DIAGNOSIS — IMO0001 Reserved for inherently not codable concepts without codable children: Secondary | ICD-10-CM | POA: Insufficient documentation

## 2011-05-10 DIAGNOSIS — M6281 Muscle weakness (generalized): Secondary | ICD-10-CM | POA: Insufficient documentation

## 2011-05-10 DIAGNOSIS — R262 Difficulty in walking, not elsewhere classified: Secondary | ICD-10-CM | POA: Insufficient documentation

## 2011-05-12 ENCOUNTER — Ambulatory Visit: Payer: Medicare Other | Admitting: Physical Therapy

## 2011-05-17 ENCOUNTER — Ambulatory Visit: Payer: Medicare Other | Admitting: Physical Therapy

## 2011-05-21 ENCOUNTER — Ambulatory Visit: Payer: Medicare Other | Attending: Family Medicine | Admitting: Physical Therapy

## 2011-05-21 DIAGNOSIS — M6281 Muscle weakness (generalized): Secondary | ICD-10-CM | POA: Insufficient documentation

## 2011-05-21 DIAGNOSIS — R262 Difficulty in walking, not elsewhere classified: Secondary | ICD-10-CM | POA: Insufficient documentation

## 2011-05-21 DIAGNOSIS — IMO0001 Reserved for inherently not codable concepts without codable children: Secondary | ICD-10-CM | POA: Insufficient documentation

## 2011-05-23 ENCOUNTER — Other Ambulatory Visit: Payer: Self-pay | Admitting: Family Medicine

## 2011-05-24 ENCOUNTER — Encounter: Payer: 59 | Admitting: Physical Therapy

## 2011-05-24 ENCOUNTER — Other Ambulatory Visit: Payer: Self-pay | Admitting: *Deleted

## 2011-05-24 MED ORDER — WARFARIN SODIUM 1 MG PO TABS: 1.0000 mg | ORAL_TABLET | Freq: Every day | ORAL | Status: DC

## 2011-05-25 ENCOUNTER — Ambulatory Visit: Payer: Medicare Other | Admitting: Physical Therapy

## 2011-05-27 ENCOUNTER — Encounter: Payer: 59 | Admitting: Physical Therapy

## 2011-05-27 ENCOUNTER — Ambulatory Visit: Payer: Medicare Other | Admitting: Physical Therapy

## 2011-05-31 ENCOUNTER — Encounter: Payer: 59 | Admitting: Physical Therapy

## 2011-06-01 ENCOUNTER — Ambulatory Visit: Payer: Medicare Other | Admitting: Physical Therapy

## 2011-06-03 ENCOUNTER — Ambulatory Visit: Payer: Medicare Other | Admitting: Physical Therapy

## 2011-06-04 ENCOUNTER — Encounter: Payer: 59 | Admitting: Physical Therapy

## 2011-06-07 ENCOUNTER — Other Ambulatory Visit: Payer: Self-pay | Admitting: Family Medicine

## 2011-06-07 ENCOUNTER — Encounter: Payer: 59 | Admitting: Physical Therapy

## 2011-06-07 ENCOUNTER — Ambulatory Visit: Payer: Self-pay | Admitting: Family Medicine

## 2011-06-07 ENCOUNTER — Telehealth: Payer: Self-pay | Admitting: *Deleted

## 2011-06-07 NOTE — Telephone Encounter (Signed)
Pt notified of coumadin instructions.

## 2011-06-08 ENCOUNTER — Ambulatory Visit: Payer: Medicare Other | Admitting: Physical Therapy

## 2011-06-10 ENCOUNTER — Encounter: Payer: 59 | Admitting: Physical Therapy

## 2011-06-14 ENCOUNTER — Encounter: Payer: 59 | Admitting: Physical Therapy

## 2011-06-15 ENCOUNTER — Encounter: Payer: 59 | Admitting: Physical Therapy

## 2011-06-17 ENCOUNTER — Encounter: Payer: 59 | Admitting: Physical Therapy

## 2011-06-19 ENCOUNTER — Other Ambulatory Visit: Payer: Self-pay | Admitting: Family Medicine

## 2011-06-30 DIAGNOSIS — I340 Nonrheumatic mitral (valve) insufficiency: Secondary | ICD-10-CM | POA: Insufficient documentation

## 2011-07-06 ENCOUNTER — Encounter: Payer: Self-pay | Admitting: Vascular Surgery

## 2011-07-07 ENCOUNTER — Ambulatory Visit (INDEPENDENT_AMBULATORY_CARE_PROVIDER_SITE_OTHER): Payer: Medicare Other | Admitting: Vascular Surgery

## 2011-07-07 ENCOUNTER — Encounter: Payer: Self-pay | Admitting: Vascular Surgery

## 2011-07-07 VITALS — BP 115/78 | HR 78 | Resp 16 | Ht 63.0 in | Wt 150.0 lb

## 2011-07-07 DIAGNOSIS — I83893 Varicose veins of bilateral lower extremities with other complications: Secondary | ICD-10-CM

## 2011-07-07 NOTE — Progress Notes (Signed)
The patient presents today for evaluation of bilateral lower extremity venous pathology. She has no history of DVT. She does have progressive history of varicose veins over the anterior surface of her right thigh extending down into her calf. She reports the sudden present for over 50 years since childbirth. She has no history of DVT superficial thrombophlebitis or bleeding. She did have an episode where she struck the anterior surface of her knee a developing hematoma associated with bleeding from the subcutaneous tissue. This resolve spontaneously. She is on chronic Coumadin for chronic atrial fibrillation. She does have some swelling and this is controlled with compression garments.  Past Medical History  Diagnosis Date  . 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  . Osteoporosis   . Herpes zoster     History  Substance Use Topics  . Smoking status: Former Smoker    Quit date: 10/19/1959  . Smokeless tobacco: Not on file  . Alcohol Use: 1.0 oz/week    2 drink(s) per week    Family History  Problem Relation Age of Onset  . Stroke Father   . Atrial fibrillation    . Heart disease Mother     Atrial Fibrillation    Allergies  Allergen Reactions  . Sulfonamide Derivatives     REACTION: rash- burning on face    Current outpatient prescriptions:AMBULATORY NON FORMULARY MEDICATION, Medication Name: PT/INR check weekly. Dx: Atrial fibrillation., Disp: 1 Units, Rfl: PRN;  diltiazem (CARDIZEM CD) 240 MG 24 hr capsule, TAKE 1 TABLET BY MOUTH ONCE A DAY, Disp: 90 capsule, Rfl: 3;  warfarin (COUMADIN) 1 MG tablet, Take 1 tablet (1 mg total) by mouth daily., Disp: 30 tablet, Rfl: 5 warfarin (COUMADIN) 5 MG tablet, TAKE 1 TABLET BY MOUTH DAILY, Disp: 30 tablet, Rfl: 0  BP 115/78  Pulse 78  Resp 16  Ht 5\' 3"  (1.6 m)  Wt 150 lb (68.04 kg)  BMI 26.57 kg/m2  Body mass index is 26.57 kg/(m^2).        Review of systems:  Chronic atrial fibrillation, arthritis, otherwise negative  Physical exam: Well-developed well-nourished white female no acute distress. HEENT normal. Radial and dorsalis pedis pulses 2+ bilaterally. Abdomen soft nontender no masses noted. Musculoskeletal no major forms or cyanosis. Neurologic no focal weakness appear seizures. Skin no venous ulcers or rashes. She does have scattered telangiectasias over both thighs she does have large varicosities mainly over the anterior surface of her right thigh.  In his chair had right saphenous vein with the SonoSite ultrasound. This revealed reflux and an anterior accessory branch in her thigh.  Impression and plan. Varicose veins right anterior thigh. She is having minimal discomfort related to this and mild lower shimmy swelling. I reassure her that there is no significant health issue related to this and that this is not present posterior to other more serious venous issues. She will continue elevation and compression. She'll notify should she develop any other difficulties. Otherwise she will see [place] on an as-needed basis.

## 2011-07-14 ENCOUNTER — Ambulatory Visit: Payer: Self-pay | Admitting: Family Medicine

## 2011-07-14 ENCOUNTER — Other Ambulatory Visit: Payer: Self-pay | Admitting: Family Medicine

## 2011-07-20 ENCOUNTER — Other Ambulatory Visit: Payer: Self-pay | Admitting: Family Medicine

## 2011-07-28 ENCOUNTER — Other Ambulatory Visit: Payer: Self-pay | Admitting: Family Medicine

## 2011-07-28 ENCOUNTER — Ambulatory Visit: Payer: Self-pay | Admitting: Family Medicine

## 2011-07-28 LAB — PROTIME-INR: INR: 2.95 — ABNORMAL HIGH (ref <1.50)

## 2011-07-29 NOTE — Progress Notes (Signed)
  Subjective:    Patient ID: Terri Small, female    DOB: [birth year], 77 y.o.   MRN: [id number]  HPI Pt aware     Review of Systems     Objective:   Physical Exam        Assessment & Plan:

## 2011-08-16 ENCOUNTER — Ambulatory Visit (INDEPENDENT_AMBULATORY_CARE_PROVIDER_SITE_OTHER): Payer: BLUE CROSS/BLUE SHIELD | Admitting: Family Medicine

## 2011-08-16 ENCOUNTER — Other Ambulatory Visit: Payer: Self-pay | Admitting: Family Medicine

## 2011-08-16 DIAGNOSIS — I4891 Unspecified atrial fibrillation: Secondary | ICD-10-CM

## 2011-08-16 LAB — PROTIME-INR
INR: 3.42 — ABNORMAL HIGH (ref <1.50)
Prothrombin Time: 33.7 seconds — ABNORMAL HIGH (ref 11.6–15.2)

## 2011-08-17 ENCOUNTER — Telehealth: Payer: Self-pay | Admitting: Family Medicine

## 2011-08-17 NOTE — Telephone Encounter (Signed)
Pt called and left mess on triage voice line and stated she is on diltiazem and coumadin and went to ortho doctor and he placed her on celebrex today.  Wants to know if this med is okay to take along with the diltiazem and coumadin. Plan:  Routed to the provider Jarvis Newcomer, LPN [facility]

## 2011-08-18 ENCOUNTER — Telehealth: Payer: Self-pay | Admitting: *Deleted

## 2011-08-18 NOTE — Telephone Encounter (Signed)
Pt informed with details and instructions by [facility].  Told pt to call office if she didn't understand the message. Jarvis Newcomer, LPN [facility]

## 2011-08-18 NOTE — Telephone Encounter (Signed)
Pt.notified

## 2011-08-18 NOTE — Telephone Encounter (Signed)
The combination of Celebrex and Coumadin can increase her INR. If she would like to try the medication and she thinks she's going to be out for a while then we can have her come in and recheck him in one week and adjust her Coumadin level if she is going to use the Celebrex daily. If she does take Celebrex she is to make sure she takes with food and water to avoid any GI upset.

## 2011-08-24 ENCOUNTER — Ambulatory Visit: Payer: Self-pay | Admitting: Family Medicine

## 2011-08-24 ENCOUNTER — Other Ambulatory Visit: Payer: Self-pay | Admitting: Family Medicine

## 2011-08-24 DIAGNOSIS — I4891 Unspecified atrial fibrillation: Secondary | ICD-10-CM

## 2011-08-24 LAB — PROTIME-INR
INR: 2.46 — ABNORMAL HIGH (ref <1.50)
Prothrombin Time: 26 seconds — ABNORMAL HIGH (ref 11.6–15.2)

## 2011-08-25 ENCOUNTER — Telehealth: Payer: Self-pay | Admitting: *Deleted

## 2011-08-25 NOTE — Telephone Encounter (Signed)
Pt notified via vm of INR results

## 2011-09-07 ENCOUNTER — Ambulatory Visit: Payer: Self-pay | Admitting: Family Medicine

## 2011-09-07 ENCOUNTER — Other Ambulatory Visit: Payer: Self-pay | Admitting: Family Medicine

## 2011-09-07 ENCOUNTER — Encounter: Payer: Self-pay | Admitting: Family Medicine

## 2011-09-07 DIAGNOSIS — I4891 Unspecified atrial fibrillation: Secondary | ICD-10-CM

## 2011-09-07 LAB — PROTIME-INR
INR: 2.74 — ABNORMAL HIGH (ref <1.50)
Prothrombin Time: 28.4 seconds — ABNORMAL HIGH (ref 11.6–15.2)

## 2011-09-23 ENCOUNTER — Ambulatory Visit (INDEPENDENT_AMBULATORY_CARE_PROVIDER_SITE_OTHER): Payer: Medicare Other | Admitting: Family Medicine

## 2011-09-23 VITALS — BP 111/77 | HR 101

## 2011-09-23 DIAGNOSIS — R300 Dysuria: Secondary | ICD-10-CM

## 2011-09-23 LAB — POCT URINALYSIS DIPSTICK
Protein, UA: NEGATIVE
Spec Grav, UA: 1.025
pH, UA: 5.5

## 2011-09-23 MED ORDER — CIPROFLOXACIN HCL 500 MG PO TABS: 500.0000 mg | ORAL_TABLET | Freq: Two times a day (BID) | ORAL | Status: AC

## 2011-09-23 NOTE — Progress Notes (Signed)
  Subjective:    Patient ID: Terri Small, female    DOB: 11-16-1930, 77 y.o.   MRN: [id number] ? UTI- c/o some dysuria, no frequency, no dysuria HPI    Review of Systems     Objective:   Physical Exam        Assessment & Plan:  UTI- No fever. Will tx since + for LE and blood.  Cal if sxs not ressolved in 3-5 days.

## 2011-09-28 ENCOUNTER — Ambulatory Visit (INDEPENDENT_AMBULATORY_CARE_PROVIDER_SITE_OTHER): Payer: Medicare Other | Admitting: Family Medicine

## 2011-09-28 ENCOUNTER — Telehealth: Payer: Self-pay | Admitting: *Deleted

## 2011-09-28 ENCOUNTER — Ambulatory Visit: Payer: Self-pay | Admitting: Family Medicine

## 2011-09-28 ENCOUNTER — Other Ambulatory Visit: Payer: Self-pay | Admitting: Family Medicine

## 2011-09-28 DIAGNOSIS — I4891 Unspecified atrial fibrillation: Secondary | ICD-10-CM

## 2011-09-28 DIAGNOSIS — R300 Dysuria: Secondary | ICD-10-CM

## 2011-09-28 DIAGNOSIS — N949 Unspecified condition associated with female genital organs and menstrual cycle: Secondary | ICD-10-CM

## 2011-09-28 DIAGNOSIS — R102 Pelvic and perineal pain: Secondary | ICD-10-CM

## 2011-09-28 LAB — POCT URINALYSIS DIPSTICK
Bilirubin, UA: NEGATIVE
Ketones, UA: NEGATIVE
pH, UA: 6

## 2011-09-28 MED ORDER — CEPHALEXIN 500 MG PO CAPS: 500.0000 mg | ORAL_CAPSULE | Freq: Two times a day (BID) | ORAL | Status: AC

## 2011-09-28 NOTE — Telephone Encounter (Signed)
Pt finished antibiotic on Sunday for UTI- still continues to have dysuria, lower pelvic pain. Wants to know if should get another round to completely clear it up or come in

## 2011-09-28 NOTE — Progress Notes (Signed)
  Subjective:    Patient ID: Terri Small, female    DOB: Nov 07, 1930, 77 y.o.   MRN: [id number] Pt came in for another UA. Treated last week for UTI but still continues to have symptoms. HPI    Review of Systems     Objective:   Physical Exam        Assessment & Plan:  UTi - + LE and blood. Will send culture adn will tx with keflex. Already compled course of Cipro. Has sulfa allergy.

## 2011-09-28 NOTE — Progress Notes (Signed)
  Subjective:    Patient ID: Terri Small, female    DOB: 1931/02/17, 77 y.o.   MRN: [id number] Pt notified HPI    Review of Systems     Objective:   Physical Exam        Assessment & Plan:

## 2011-09-28 NOTE — Telephone Encounter (Signed)
Needs to come in for urine culture.

## 2011-09-30 ENCOUNTER — Other Ambulatory Visit: Payer: Self-pay | Admitting: Family Medicine

## 2011-10-04 LAB — URINE CULTURE: Colony Count: >=100000

## 2011-10-22 ENCOUNTER — Ambulatory Visit: Payer: Self-pay | Admitting: Family Medicine

## 2011-10-22 ENCOUNTER — Other Ambulatory Visit: Payer: Self-pay | Admitting: Family Medicine

## 2011-10-22 DIAGNOSIS — I4891 Unspecified atrial fibrillation: Secondary | ICD-10-CM

## 2011-10-22 LAB — PROTIME-INR: INR: 2.96 — ABNORMAL HIGH (ref <1.50)

## 2011-10-30 ENCOUNTER — Other Ambulatory Visit: Payer: Self-pay | Admitting: Family Medicine

## 2011-11-08 ENCOUNTER — Telehealth: Payer: Self-pay | Admitting: *Deleted

## 2011-11-08 NOTE — Telephone Encounter (Signed)
Pt.notified

## 2011-11-08 NOTE — Telephone Encounter (Signed)
There is an increased risk of bleeds with all NSAIDs including her volteran adn her coumadin.  Is this the topical volteran or the pills. Topical is OK. If pills then we may need to consider an altnative such as cymbalta.

## 2011-11-08 NOTE — Telephone Encounter (Signed)
Pt called and states her ortho rx'ed voltaren to her and wanted to know if this was ok since she is on coumadin

## 2011-11-11 ENCOUNTER — Ambulatory Visit (INDEPENDENT_AMBULATORY_CARE_PROVIDER_SITE_OTHER): Payer: Medicare Other | Admitting: Family Medicine

## 2011-11-11 ENCOUNTER — Encounter: Payer: Self-pay | Admitting: Family Medicine

## 2011-11-11 ENCOUNTER — Other Ambulatory Visit: Payer: Self-pay | Admitting: Family Medicine

## 2011-11-11 ENCOUNTER — Ambulatory Visit: Payer: Self-pay | Admitting: Family Medicine

## 2011-11-11 VITALS — BP 123/81 | HR 115 | Temp 97.9°F | Wt 155.0 lb

## 2011-11-11 DIAGNOSIS — I4891 Unspecified atrial fibrillation: Secondary | ICD-10-CM

## 2011-11-11 DIAGNOSIS — H109 Unspecified conjunctivitis: Secondary | ICD-10-CM

## 2011-11-11 MED ORDER — ERYTHROMYCIN 5 MG/GM OP OINT: TOPICAL_OINTMENT | Freq: Four times a day (QID) | OPHTHALMIC | Status: AC

## 2011-11-11 NOTE — Progress Notes (Signed)
  Subjective:    Patient ID: Terri Small, female    DOB: Jul 04, 1931, 78 y.o.   MRN: [id number]  HPI  When starts with a cold will get runny eyes. Yesterday right is red and runny and yellow discharge. No vision changes.  Ithcy but not painful.  No medications for it.  No fever.  Runny nose, no othe rURI sxs.   Review of Systems     Objective:   Physical Exam  Constitutional: She is oriented to person, place, and time. She appears well-developed and well-nourished.  HENT:  Head: Normocephalic and atraumatic.  Right Ear: External ear normal.  Left Ear: External ear normal.  Nose: Nose normal.  Mouth/Throat: Oropharynx is clear and moist.       Right eye with injected sclera. There is no active discharge. Her upper and lower lids are mildly edematous and erythematous. Extremity movements are intact pupils. Pupils are equal round and reactive to light.  Eyes: Conjunctivae and EOM are normal. Pupils are equal, round, and reactive to light.  Neck: Neck supple. No thyromegaly present.  Cardiovascular: Normal rate, regular rhythm and normal heart sounds.   Pulmonary/Chest: Effort normal and breath sounds normal. She has no wheezes.  Lymphadenopathy:    She has no cervical adenopathy.  Neurological: She is alert and oriented to person, place, and time.  Skin: Skin is warm and dry.  Psychiatric: She has a normal mood and affect.          Assessment & Plan:  Conjuncitivitis - possible viral vs  Bacterial. Will go ahed and tx with erythromycin ophthalmic ointment. Call if not better in one week or sooner she feels she's getting worse.

## 2011-11-12 ENCOUNTER — Telehealth: Payer: Self-pay | Admitting: *Deleted

## 2011-11-12 NOTE — Telephone Encounter (Signed)
Pt notified of dose and instructions

## 2011-11-17 ENCOUNTER — Telehealth: Payer: Self-pay | Admitting: Family Medicine

## 2011-11-17 NOTE — Telephone Encounter (Signed)
Please call the patient and let her know that I did investigate the nasal insulin. Evidently it is still investigational and under research. Evidently there was an inhaled insulin that was tested on the market back in 2009 by Drs. Benay Pillow but unfortunately it was unable to prove beneficial for patients so the study was stopped. As far as the nasal insulin I believe it still under research. I am not aware of anyone in her local area who is participating in a research study involving the nasal insulin.  If she  knows of a place, certainly if she wanted to have him in a research study I think that that would be appropriate.

## 2011-11-18 NOTE — Telephone Encounter (Signed)
Pt informed and states Thank you for looking into it for her.

## 2011-12-03 ENCOUNTER — Other Ambulatory Visit: Payer: Self-pay | Admitting: Family Medicine

## 2011-12-03 LAB — PROTIME-INR: INR: 2.47 — ABNORMAL HIGH (ref <1.50)

## 2011-12-04 ENCOUNTER — Ambulatory Visit: Payer: Self-pay | Admitting: Family Medicine

## 2011-12-04 DIAGNOSIS — I4891 Unspecified atrial fibrillation: Secondary | ICD-10-CM

## 2011-12-06 ENCOUNTER — Telehealth: Payer: Self-pay | Admitting: *Deleted

## 2011-12-06 NOTE — Telephone Encounter (Signed)
Coumadin instructions

## 2011-12-09 ENCOUNTER — Encounter: Payer: Self-pay | Admitting: Family Medicine

## 2011-12-09 ENCOUNTER — Ambulatory Visit (INDEPENDENT_AMBULATORY_CARE_PROVIDER_SITE_OTHER): Payer: Medicare Other | Admitting: Family Medicine

## 2011-12-09 VITALS — BP 121/81 | HR 119 | Ht 63.0 in | Wt 151.0 lb

## 2011-12-09 DIAGNOSIS — J40 Bronchitis, not specified as acute or chronic: Secondary | ICD-10-CM

## 2011-12-09 DIAGNOSIS — J329 Chronic sinusitis, unspecified: Secondary | ICD-10-CM

## 2011-12-09 DIAGNOSIS — J0100 Acute maxillary sinusitis, unspecified: Secondary | ICD-10-CM

## 2011-12-09 MED ORDER — AZITHROMYCIN 250 MG PO TABS: 500.0000 mg | ORAL_TABLET | Freq: Once | ORAL | Status: AC

## 2011-12-09 NOTE — Progress Notes (Signed)
  Subjective:    Patient ID: Terri Small, female    DOB: Mar 01, 1931, 78 y.o.   MRN: [id number]  Cough This is a new problem. The current episode started in the past 7 days. The problem has been rapidly worsening. The problem occurs every few minutes. The cough is non-productive (but when production occurs there is a little bit of blood). Associated symptoms include chills, ear congestion, hemoptysis, nasal congestion, postnasal drip and wheezing. Pertinent negatives include no chest pain, rash, shortness of breath or sweats. The symptoms are aggravated by nothing. She has tried OTC cough suppressant for the symptoms. The treatment provided no relief. There is no history of asthma, bronchiectasis, bronchitis, COPD, emphysema, environmental allergies or pneumonia.      Review of Systems  Constitutional: Positive for chills.  HENT: Positive for postnasal drip.   Respiratory: Positive for hemoptysis and wheezing. Negative for shortness of breath.   Cardiovascular: Negative for chest pain.  Skin: Negative for rash.  Hematological: Negative for environmental allergies.  All other systems reviewed and are negative.      BP 121/81  Pulse 119  Wt 151 lb (68.493 kg)  SpO2 95% Objective:   Physical Exam  Nursing note and vitals reviewed. Constitutional: She is oriented to person, place, and time. She appears well-developed and well-nourished. No distress.  HENT:  Head: Normocephalic and atraumatic.  Right Ear: Tympanic membrane normal.  Left Ear: Tympanic membrane normal.  Nose: Nose normal.  Mouth/Throat: Oropharynx is clear and moist. No oropharyngeal exudate.  Eyes: Right eye exhibits no discharge. Left eye exhibits no discharge. No scleral icterus.  Neck: Neck supple.  Cardiovascular: Regular rhythm and normal heart sounds.        Tachycardia and irregular. Patient does have HX of atrial fib.  Pulmonary/Chest: Effort normal. No respiratory distress. She has wheezes. She has rhonchi. She  has no rales.  Lymphadenopathy:    She has no cervical adenopathy.  Neurological: She is alert and oriented to person, place, and time.  Skin: Skin is warm and dry.  Psychiatric: She has a normal mood and affect.          Assessment & Plan:  Sinusitis bronchitis We'll place her on a Z-Pak for sinusitis and bronchitis because of her atrial fib will avoid any stimulation medication. She has used Advair Diskus in the past and we'll see if we have one her for her. And will also go with a steroid nasal sprays to help open up sinuses as well. I will give her a sample of the Qvar nasal spray.   will return when necessary as needed

## 2011-12-09 NOTE — Patient Instructions (Signed)
Bronchitis Bronchitis is the body's way of reacting to injury and/or infection (inflammation) of the bronchi. Bronchi are the air tubes that extend from the windpipe into the lungs. If the inflammation becomes severe, it may cause shortness of breath. CAUSES  Inflammation may be caused by:  A virus.   Germs (bacteria).   Dust.   Allergens.   Pollutants and many other irritants.  The cells lining the bronchial tree are covered with tiny hairs (cilia). These constantly beat upward, away from the lungs, toward the mouth. This keeps the lungs free of pollutants. When these cells become too irritated and are unable to do their job, mucus begins to develop. This causes the characteristic cough of bronchitis. The cough clears the lungs when the cilia are unable to do their job. Without either of these protective mechanisms, the mucus would settle in the lungs. Then you would develop pneumonia. Smoking is a common cause of bronchitis and can contribute to pneumonia. Stopping this habit is the single most important thing you can do to help yourself. TREATMENT   Your caregiver may prescribe an antibiotic if the cough is caused by bacteria. Also, medicines that open up your airways make it easier to breathe. Your caregiver may also recommend or prescribe an expectorant. It will loosen the mucus to be coughed up. Only take over-the-counter or prescription medicines for pain, discomfort, or fever as directed by your caregiver.   Removing whatever causes the problem (smoking, for example) is critical to preventing the problem from getting worse.   Cough suppressants may be prescribed for relief of cough symptoms.   Inhaled medicines may be prescribed to help with symptoms now and to help prevent problems from returning.   For those with recurrent (chronic) bronchitis, there may be a need for steroid medicines.  SEEK IMMEDIATE MEDICAL CARE IF:   During treatment, you develop more pus-like mucus  (purulent sputum).   You have a fever.   Your baby is older than 3 months with a rectal temperature of 102 F (38.9 C) or higher.   Your baby is 3 months old or younger with a rectal temperature of 100.4 F (38 C) or higher.   You become progressively more ill.   You have increased difficulty breathing, wheezing, or shortness of breath.  It is necessary to seek immediate medical care if you are elderly or sick from any other disease. MAKE SURE YOU:   Understand these instructions.   Will watch your condition.   Will get help right away if you are not doing well or get worse.  Document Released: 10/04/2005 Document Revised: 06/16/2011 Document Reviewed: 08/13/2008 Exit[employer] Patient Information 2012 [employer].Sinusitis Sinuses are air pockets within the bones of your face. The growth of bacteria within a sinus leads to infection. The infection prevents the sinuses from draining. This infection is called sinusitis. SYMPTOMS  There will be different areas of pain depending on which sinuses have become infected.  The maxillary sinuses often produce pain beneath the eyes.   Frontal sinusitis may cause pain in the middle of the forehead and above the eyes.  Other problems (symptoms) include:  Toothaches.   Colored, pus-like (purulent) drainage from the nose.   Swelling, warmth, and tenderness over the sinus areas may be signs of infection.  TREATMENT  Sinusitis is most often determined by an exam.X-rays may be taken. If x-rays have been taken, make sure you obtain your results or find out how you are to obtain them. Your caregiver   may give you medications (antibiotics). These are medications that will help kill the bacteria causing the infection. You may also be given a medication (decongestant) that helps to reduce sinus swelling.  HOME CARE INSTRUCTIONS   Only take over-the-counter or prescription medicines for pain, discomfort, or fever as directed by your caregiver.    Drink extra fluids. Fluids help thin the mucus so your sinuses can drain more easily.   Applying either moist heat or ice packs to the sinus areas may help relieve discomfort.   Use saline nasal sprays to help moisten your sinuses. The sprays can be found at your local drugstore.  SEEK IMMEDIATE MEDICAL CARE IF:  You have a fever.   You have increasing pain, severe headaches, or toothache.   You have nausea, vomiting, or drowsiness.   You develop unusual swelling around the face or trouble seeing.  MAKE SURE YOU:   Understand these instructions.   Will watch your condition.   Will get help right away if you are not doing well or get worse.  Document Released: 10/04/2005 Document Revised: 06/16/2011 Document Reviewed: 05/03/2007 [employer] Patient Information 2012 [employer]. 

## 2011-12-29 ENCOUNTER — Other Ambulatory Visit: Payer: Self-pay | Admitting: Family Medicine

## 2011-12-30 ENCOUNTER — Ambulatory Visit: Payer: Self-pay | Admitting: Family Medicine

## 2011-12-30 ENCOUNTER — Other Ambulatory Visit: Payer: Self-pay | Admitting: Family Medicine

## 2011-12-30 DIAGNOSIS — I4891 Unspecified atrial fibrillation: Secondary | ICD-10-CM

## 2011-12-31 NOTE — Progress Notes (Signed)
Pt informed

## 2012-01-06 ENCOUNTER — Telehealth: Payer: Self-pay | Admitting: *Deleted

## 2012-01-06 NOTE — Telephone Encounter (Signed)
REally the only option would be hydrocodone/APAP (vocodin). We just have to limit the tylenol to no more than 2 grams a day.  All NSAIDs and tramadol will affect her coumadin. Could also consider cymbalta which is taken every day,  to take daily for pain. cymbalta aslo can think the blood but if taking everyday then we can adjust the coumadin for the medication.

## 2012-01-06 NOTE — Telephone Encounter (Signed)
meds was on for arthritis from her ortho she states you really did not want her to take due to the warfarin. Wants to know if there is anything she can take OTC or by prescription that she can take. Uses Tylenol and got orthotics and sneakers from [employer].

## 2012-01-06 NOTE — Telephone Encounter (Signed)
Called pt and informed of MD suggestions. Pt is going to think about it and will let us know what she decides to do.

## 2012-01-27 ENCOUNTER — Ambulatory Visit: Payer: Self-pay | Admitting: Family Medicine

## 2012-01-27 ENCOUNTER — Other Ambulatory Visit: Payer: Self-pay | Admitting: Family Medicine

## 2012-01-27 DIAGNOSIS — I4891 Unspecified atrial fibrillation: Secondary | ICD-10-CM

## 2012-01-27 LAB — PROTIME-INR
INR: 2.96 — ABNORMAL HIGH (ref <1.50)
Prothrombin Time: 30.1 seconds — ABNORMAL HIGH (ref 11.6–15.2)

## 2012-02-10 ENCOUNTER — Ambulatory Visit: Payer: Self-pay | Admitting: Family Medicine

## 2012-02-10 ENCOUNTER — Other Ambulatory Visit: Payer: Self-pay | Admitting: Family Medicine

## 2012-02-10 DIAGNOSIS — I4891 Unspecified atrial fibrillation: Secondary | ICD-10-CM

## 2012-02-11 ENCOUNTER — Telehealth: Payer: Self-pay | Admitting: *Deleted

## 2012-02-11 NOTE — Telephone Encounter (Signed)
Called pt yesterday afternoon at 5pm and left message with results on vm

## 2012-02-23 ENCOUNTER — Other Ambulatory Visit: Payer: Self-pay | Admitting: Family Medicine

## 2012-02-23 ENCOUNTER — Encounter: Payer: Self-pay | Admitting: Physician Assistant

## 2012-02-23 ENCOUNTER — Ambulatory Visit (INDEPENDENT_AMBULATORY_CARE_PROVIDER_SITE_OTHER): Payer: Medicare Other | Admitting: Physician Assistant

## 2012-02-23 ENCOUNTER — Ambulatory Visit: Payer: Self-pay | Admitting: Family Medicine

## 2012-02-23 ENCOUNTER — Telehealth: Payer: Self-pay | Admitting: *Deleted

## 2012-02-23 VITALS — BP 109/65 | HR 70 | Temp 98.1°F | Ht 63.0 in | Wt 154.0 lb

## 2012-02-23 DIAGNOSIS — N390 Urinary tract infection, site not specified: Secondary | ICD-10-CM

## 2012-02-23 DIAGNOSIS — I4891 Unspecified atrial fibrillation: Secondary | ICD-10-CM

## 2012-02-23 LAB — PROTIME-INR
INR: 2.02 — ABNORMAL HIGH (ref <1.50)
Prothrombin Time: 22.4 seconds — ABNORMAL HIGH (ref 11.6–15.2)

## 2012-02-23 LAB — POCT URINALYSIS DIPSTICK
Blood, UA: NEGATIVE
Protein, UA: NEGATIVE
Spec Grav, UA: >=1.03
pH, UA: 5.5

## 2012-02-23 MED ORDER — CIPROFLOXACIN HCL 500 MG PO TABS: 500.0000 mg | ORAL_TABLET | Freq: Two times a day (BID) | ORAL | Status: DC

## 2012-02-23 NOTE — Patient Instructions (Addendum)
Cipro for 3 days. Call if not improving or symptoms. Will call with culture results.

## 2012-02-23 NOTE — Telephone Encounter (Signed)
Pt.notified

## 2012-02-23 NOTE — Progress Notes (Signed)
  Subjective:    Patient ID: Terri Small, female    DOB: 15-Sep-1931, 78 y.o.   MRN: [id number]  HPI Patient presents with UTI symptoms for 2 days. She has a history of UTI's and has came in as early to get treated faster. She is having a lot of abdominal pressure and not able to urinate as much. She has not had a fever, N/V, or back pain.     Review of Systems     Objective:   Physical Exam  Constitutional: She is oriented to person, place, and time. She appears well-developed and well-nourished.  HENT:  Head: Normocephalic and atraumatic.  Cardiovascular: Normal rate, regular rhythm and normal heart sounds.   Pulmonary/Chest: Effort normal and breath sounds normal. She has no wheezes.       No CVA tenderness.  Abdominal: Soft. Bowel sounds are normal.       Mild tenderness in the lower abdomen with pressure.  Neurological: She is alert and oriented to person, place, and time.  Skin: Skin is warm and dry.  Psychiatric: She has a normal mood and affect. Her behavior is normal.          Assessment & Plan:  UTI- UA positive for leuks and neg for blood and nitrates. Treated with Cipro for 3 days. Will still culture because she has had a history of UTI's and I want to make sure abx works. Will call with results. Call if worsening or not improving in next 48 hours. Cipro does increase coumadin effectiviness but after consult with Dr. Linford Arnold we decided that 3 days would not significantly interfer.Stay hydrated.

## 2012-02-25 ENCOUNTER — Telehealth: Payer: Self-pay | Admitting: *Deleted

## 2012-02-25 NOTE — Telephone Encounter (Signed)
Pt ws notified yesterday

## 2012-03-01 ENCOUNTER — Telehealth: Payer: Self-pay | Admitting: *Deleted

## 2012-03-01 NOTE — Telephone Encounter (Signed)
Pt called and she still feels like she has a UTI. Still having abd discomfort. Do you want her to schedule appt to see you?

## 2012-03-01 NOTE — Telephone Encounter (Signed)
Called and left message for pt to call back and schedule an appt

## 2012-03-01 NOTE — Telephone Encounter (Signed)
Have her come in for repeat UA

## 2012-03-02 ENCOUNTER — Ambulatory Visit (INDEPENDENT_AMBULATORY_CARE_PROVIDER_SITE_OTHER): Payer: Medicare Other | Admitting: Family Medicine

## 2012-03-02 ENCOUNTER — Encounter: Payer: Self-pay | Admitting: Family Medicine

## 2012-03-02 VITALS — BP 111/78 | HR 95 | Temp 98.2°F | Ht 63.0 in | Wt 155.0 lb

## 2012-03-02 DIAGNOSIS — N390 Urinary tract infection, site not specified: Secondary | ICD-10-CM

## 2012-03-02 LAB — POCT URINALYSIS DIPSTICK
Glucose, UA: NEGATIVE
Nitrite, UA: NEGATIVE
Urobilinogen, UA: 0.2

## 2012-03-02 MED ORDER — PHENAZOPYRIDINE HCL 200 MG PO TABS: 200.0000 mg | ORAL_TABLET | Freq: Three times a day (TID) | ORAL | Status: AC | PRN

## 2012-03-02 MED ORDER — FLUCONAZOLE 150 MG PO TABS: 150.0000 mg | ORAL_TABLET | Freq: Once | ORAL | Status: AC

## 2012-03-02 MED ORDER — CEFDINIR 300 MG PO CAPS: 300.0000 mg | ORAL_CAPSULE | Freq: Two times a day (BID) | ORAL | Status: AC

## 2012-03-02 NOTE — Patient Instructions (Signed)

## 2012-03-02 NOTE — Progress Notes (Signed)
  Subjective:    Patient ID: Terri Small, female    DOB: [birth year], 78 y.o.   MRN: [id number]  Urinary Tract Infection  This is a recurrent problem. The current episode started in the past 7 days (Symptoms started Thursday night patient saw MsBrebeeck who placed the patient on Cipro for 3 days initially it seemed to help but by Tuesday after she used the antibiotics the symptoms start her back and today it was a lot worse). The problem occurs every urination. The problem has been gradually worsening. The quality of the pain is described as stabbing. There has been no fever. She is not sexually active. There is no history of pyelonephritis. Pertinent negatives include no chills, discharge, flank pain, frequency, hematuria, hesitancy, nausea, possible pregnancy or vomiting. Associated symptoms comments: Constant pain and w/dysuria. She has tried antibiotics for the symptoms. The treatment provided mild relief. Her past medical history is significant for recurrent UTIs. rectal incontenence      Review of Systems  Constitutional: Negative for chills.  Gastrointestinal: Negative for nausea and vomiting.  Genitourinary: Negative for hesitancy, frequency, hematuria and flank pain.      BP 111/78  Pulse 95  Temp(Src) 98.2 F (36.8 C) (Oral)  Ht 5\' 3"  (1.6 m)  Wt 155 lb (70.308 kg)  BMI 27.46 kg/m2  SpO2 96% Objective:   Physical Exam  Constitutional: She is oriented to person, place, and time. She appears well-developed and well-nourished.  HENT:  Head: Normocephalic.  Abdominal: Soft. Bowel sounds are normal. There is no tenderness. There is no CVA tenderness.  Musculoskeletal: Normal range of motion.  Neurological: She is alert and oriented to person, place, and time.  Skin: Skin is warm.  Psychiatric: She has a normal mood and affect.      Results for orders placed in visit on 03/02/12  POCT URINALYSIS DIPSTICK      Component Value Range   Color, UA yellow     Clarity, UA clear     Glucose, UA neg     Bilirubin, UA small     Ketones, UA 15     Spec Grav, UA >=1.030     Blood, UA small     pH, UA 6.0     Protein, UA 30     Urobilinogen, UA 0.2     Nitrite, UA neg     Leukocytes, UA small (1+)     Assessment & Plan:  Resistance to Cipro present on urine culture and sensitivity. Third-generation cyclosporines seems to be receptive I will place her on Omnicef 300 mg twice a day. She declined injection of Rocephin. We'll also place her peridium 200 mg 3 times a day and if needed Diflucan 150 one tablet and repeat in a week if still needed for yeast infection. Did warn her that there can be an interaction between Coumadin and the Diflucan but would not keep in on the Diflucan long-term and will avoid giving her a single dose repeated in a week's time if needed. Return followup when necessary.

## 2012-03-09 ENCOUNTER — Other Ambulatory Visit: Payer: Self-pay | Admitting: Family Medicine

## 2012-03-09 ENCOUNTER — Telehealth: Payer: Self-pay | Admitting: *Deleted

## 2012-03-09 ENCOUNTER — Ambulatory Visit: Payer: Self-pay | Admitting: Family Medicine

## 2012-03-09 DIAGNOSIS — I4891 Unspecified atrial fibrillation: Secondary | ICD-10-CM

## 2012-03-09 LAB — PROTIME-INR
INR: 2.31 — ABNORMAL HIGH (ref <1.50)
Prothrombin Time: 24.8 seconds — ABNORMAL HIGH (ref 11.6–15.2)

## 2012-03-09 NOTE — Telephone Encounter (Signed)
Pt notified of coumadin results

## 2012-03-21 ENCOUNTER — Encounter: Payer: Self-pay | Admitting: Family Medicine

## 2012-03-21 ENCOUNTER — Ambulatory Visit (INDEPENDENT_AMBULATORY_CARE_PROVIDER_SITE_OTHER): Payer: Medicare Other | Admitting: Family Medicine

## 2012-03-21 VITALS — BP 134/91 | HR 101 | Temp 97.6°F | Ht 63.0 in | Wt 155.0 lb

## 2012-03-21 DIAGNOSIS — N390 Urinary tract infection, site not specified: Secondary | ICD-10-CM

## 2012-03-21 LAB — POCT URINALYSIS DIPSTICK
Nitrite, UA: NEGATIVE
Spec Grav, UA: 1.02
Urobilinogen, UA: 0.2
pH, UA: 5.5

## 2012-03-21 MED ORDER — NITROFURANTOIN MONOHYD MACRO 100 MG PO CAPS: 100.0000 mg | ORAL_CAPSULE | Freq: Two times a day (BID) | ORAL | Status: AC

## 2012-03-21 NOTE — Progress Notes (Signed)
  Subjective:    Patient ID: Danyela Posas, female    DOB: 07-Feb-1931, 78 y.o.   MRN: [id number]  HPI  URINARY SXS Only imporoved for afew days after ABX but says kept coming back. Says it tends to "flare".  This is the 3rd time.  First was culture and was e coli and was tx with cipro (was resistannt. Then repeeat UA + and tx with omnicef.  Occ does get some small amt of rectal leakage.  No fever or back pain.  + dysuria.   Review of Systems     Objective:   Physical Exam  Constitutional: She appears well-developed and well-nourished.  Skin: Skin is warm and dry.  Psychiatric: She has a normal mood and affect. Her behavior is normal.          Assessment & Plan:  UTI - will send culture. Will start nitrofurantoin since orig culture was sensitive to nitrofurantoin. Will call with cultlure results later this week. Call if suddenly gets worse or develops a fever or back pain. If we are unable to clear this infection then I recommend referral to urology for further evaluation workup.

## 2012-03-21 NOTE — Patient Instructions (Signed)
We will call you with the culture results 

## 2012-03-24 LAB — URINE CULTURE

## 2012-03-31 ENCOUNTER — Other Ambulatory Visit: Payer: Self-pay | Admitting: Family Medicine

## 2012-03-31 ENCOUNTER — Telehealth: Payer: Self-pay | Admitting: *Deleted

## 2012-03-31 ENCOUNTER — Ambulatory Visit: Payer: Self-pay | Admitting: Family Medicine

## 2012-03-31 DIAGNOSIS — I4891 Unspecified atrial fibrillation: Secondary | ICD-10-CM

## 2012-03-31 LAB — PROTIME-INR: INR: 3.17 — ABNORMAL HIGH (ref <1.50)

## 2012-03-31 NOTE — Telephone Encounter (Signed)
Pt notified of instructions. Pt wants to schedule appt to come in to go over medication because she is going tobe in [place] for most of the summer

## 2012-04-03 ENCOUNTER — Ambulatory Visit: Payer: BLUE CROSS/BLUE SHIELD | Admitting: Family Medicine

## 2012-04-05 ENCOUNTER — Encounter: Payer: Self-pay | Admitting: Family Medicine

## 2012-04-05 ENCOUNTER — Ambulatory Visit (INDEPENDENT_AMBULATORY_CARE_PROVIDER_SITE_OTHER): Payer: Medicare Other | Admitting: Family Medicine

## 2012-04-05 VITALS — BP 116/78 | HR 99 | Ht 63.0 in | Wt 153.0 lb

## 2012-04-05 DIAGNOSIS — Z1331 Encounter for screening for depression: Secondary | ICD-10-CM

## 2012-04-05 DIAGNOSIS — Z9181 History of falling: Secondary | ICD-10-CM

## 2012-04-05 DIAGNOSIS — I4891 Unspecified atrial fibrillation: Secondary | ICD-10-CM

## 2012-04-05 MED ORDER — WARFARIN SODIUM 5 MG PO TABS: 5.0000 mg | ORAL_TABLET | Freq: Every day | ORAL | Status: DC

## 2012-04-05 MED ORDER — AMBULATORY NON FORMULARY MEDICATION: Status: DC

## 2012-04-05 MED ORDER — DILTIAZEM HCL ER COATED BEADS 240 MG PO CP24: 240.0000 mg | ORAL_CAPSULE | Freq: Every day | ORAL | Status: DC

## 2012-04-05 NOTE — Progress Notes (Addendum)
  Subjective:    Patient ID: Terri Small, female    DOB: [birth year], 78 y.o.   MRN: [id number]  HPI Atrial fibrillation-she's due for Coumadin check tomorrow. She is taking her medications regularly without any problems or side effects. No chest pain short of breath. She does need prescription for her medications for 90 day supply. She will be in [place] for several months. She will sit a prescription to have her PT and INR checked at a local lab there. She is already make contact with the lab there.  She has determined a support group in [place]. 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 She says this has been really helpful for her. Review of Systems     Objective:   Physical Exam  Constitutional: She is oriented to person, place, and time. She appears well-developed and well-nourished.  HENT:  Head: Normocephalic and atraumatic.  Cardiovascular: Normal rate, regular rhythm and normal heart sounds.   Pulmonary/Chest: Effort normal and breath sounds normal.  Musculoskeletal: She exhibits no edema.  Neurological: She is alert and oriented to person, place, and time.  Skin: Skin is warm and dry.  Psychiatric: She has a normal mood and affect. Her behavior is normal.          Assessment & Plan:  Atrial fib - given 90 day written prescriptions for her medications. Also give her prescription have her PT /INR to be checked at least once a week. She will have the results faxed here we will adjust her Coumadin over the phone as she is back from her vacation in [place] which will likely be in September.  She said she did try to get her shingles vaccine but says the cost was over $200. I encouraged her to try again next year and make sure that they are processing her Medicare part D. if it is a covered vaccine.  She is due for colonoscopy this year and says that she would like to possibly get this scheduled in the fall when she gets back from [place].  Fall  assessment-score of 2, low risk for falls.  Depression screen-PHQ 9 score of 0. Negative for depression.

## 2012-04-06 ENCOUNTER — Other Ambulatory Visit: Payer: Self-pay | Admitting: Family Medicine

## 2012-04-06 ENCOUNTER — Ambulatory Visit: Payer: Self-pay | Admitting: Family Medicine

## 2012-04-06 DIAGNOSIS — I4891 Unspecified atrial fibrillation: Secondary | ICD-10-CM

## 2012-04-06 DIAGNOSIS — Z1211 Encounter for screening for malignant neoplasm of colon: Secondary | ICD-10-CM

## 2012-04-06 LAB — PROTIME-INR
INR: 2.06 — ABNORMAL HIGH (ref <1.50)
Prothrombin Time: 22.7 seconds — ABNORMAL HIGH (ref 11.6–15.2)

## 2012-04-07 ENCOUNTER — Other Ambulatory Visit: Payer: Self-pay | Admitting: *Deleted

## 2012-04-07 MED ORDER — WARFARIN SODIUM 5 MG PO TABS: 5.0000 mg | ORAL_TABLET | Freq: Every day | ORAL | Status: DC

## 2012-04-17 ENCOUNTER — Other Ambulatory Visit: Payer: Self-pay | Admitting: Family Medicine

## 2012-04-17 ENCOUNTER — Ambulatory Visit: Payer: Self-pay | Admitting: Family Medicine

## 2012-04-17 DIAGNOSIS — I4891 Unspecified atrial fibrillation: Secondary | ICD-10-CM

## 2012-04-17 LAB — PROTIME-INR: INR: 2.46 — ABNORMAL HIGH (ref <1.50)

## 2012-04-17 NOTE — Progress Notes (Signed)
Pt informed. States she will be out of town in 2 weeks but it will be checked there and results faxed to [place].

## 2012-05-03 ENCOUNTER — Telehealth: Payer: Self-pay | Admitting: *Deleted

## 2012-05-03 NOTE — Telephone Encounter (Signed)
Pt.notified

## 2012-05-03 NOTE — Telephone Encounter (Signed)
Pt called and states she is in [place] and she did have her inr checked there and they will be faxing [place] the results. Pt is also going to have her colonoscopy done there as well. She was told to stop her INR 5 days prior to the procedure which will be 8/19. Pt wants to know if this is ok

## 2012-05-03 NOTE — Telephone Encounter (Signed)
Yes, okay to hold Coumadin 5 days prior to procedure. I will keep an eye out for her Coumadin report

## 2012-05-05 ENCOUNTER — Ambulatory Visit: Payer: Self-pay | Admitting: Family Medicine

## 2012-05-05 DIAGNOSIS — I4891 Unspecified atrial fibrillation: Secondary | ICD-10-CM

## 2012-05-08 NOTE — Progress Notes (Signed)
Pt states she spoke with someone of Friday and got new dose.

## 2012-05-19 ENCOUNTER — Telehealth: Payer: Self-pay | Admitting: *Deleted

## 2012-05-19 ENCOUNTER — Ambulatory Visit: Payer: Self-pay | Admitting: Family Medicine

## 2012-05-19 DIAGNOSIS — I4891 Unspecified atrial fibrillation: Secondary | ICD-10-CM

## 2012-05-19 NOTE — Telephone Encounter (Signed)
LM on VM for pt with detail instructions.

## 2012-05-19 NOTE — Telephone Encounter (Signed)
Pt has called asking if you have received the results of her INR from the doctor in [place]? States it was 2.1 and they told her to stay on same dose and recheck in 4 weeks.

## 2012-05-19 NOTE — Telephone Encounter (Signed)
I have not received the lab results, but based on her report of 2.1. I recommend staying on the same dose but rechecking in 2 weeks instead of 4. The mesh was borderline low I think waiting 4 weeks is too long.

## 2012-05-30 ENCOUNTER — Ambulatory Visit: Payer: Self-pay | Admitting: Family Medicine

## 2012-05-30 DIAGNOSIS — I4891 Unspecified atrial fibrillation: Secondary | ICD-10-CM

## 2012-05-30 LAB — PROTIME-INR: INR: 2.8 — AB (ref 0.9–1.1)

## 2012-05-31 ENCOUNTER — Telehealth: Payer: Self-pay | Admitting: *Deleted

## 2012-05-31 NOTE — Telephone Encounter (Signed)
Called and left message on vm with coumadin instructions

## 2012-06-22 ENCOUNTER — Telehealth: Payer: Self-pay | Admitting: Family Medicine

## 2012-06-22 NOTE — Telephone Encounter (Signed)
Pt called to say that she had her coumadin (pt/inr) level checked and it was 2.4.  They said she needed to stay at same dose for now.  She will do so unless you think otherwise.  thanks

## 2012-06-22 NOTE — Telephone Encounter (Signed)
I agree. Stay on same dose.

## 2012-06-26 NOTE — Telephone Encounter (Signed)
Left vm on pts vm

## 2012-07-14 ENCOUNTER — Ambulatory Visit: Payer: Self-pay | Admitting: Family Medicine

## 2012-07-14 ENCOUNTER — Other Ambulatory Visit: Payer: Self-pay | Admitting: Family Medicine

## 2012-07-14 DIAGNOSIS — I4891 Unspecified atrial fibrillation: Secondary | ICD-10-CM

## 2012-07-14 LAB — PROTIME-INR: INR: 2.58 — ABNORMAL HIGH (ref <1.50)

## 2012-08-22 ENCOUNTER — Other Ambulatory Visit: Payer: Self-pay | Admitting: Family Medicine

## 2012-08-22 ENCOUNTER — Ambulatory Visit: Payer: Self-pay | Admitting: Family Medicine

## 2012-08-22 DIAGNOSIS — I4891 Unspecified atrial fibrillation: Secondary | ICD-10-CM

## 2012-08-24 NOTE — Progress Notes (Signed)
  Subjective:    Patient ID: Terri Small, female    DOB: [birth year], 78 y.o.   MRN: [id number]  HPI    Review of Systems     Objective:   Physical Exam        Assessment & Plan:  Pt notified of instructions. KG LPN

## 2012-09-11 ENCOUNTER — Telehealth: Payer: Self-pay | Admitting: *Deleted

## 2012-09-11 MED ORDER — TURMERIC 500 MG PO CAPS: 1.0000 | ORAL_CAPSULE | Freq: Every day | ORAL | Status: DC

## 2012-09-11 NOTE — Telephone Encounter (Signed)
See other message

## 2012-09-11 NOTE — Telephone Encounter (Signed)
I think the turmeric is fine.  I couldn't fine any interaction. Fish oil if taking more than 6000mg  a day can increase bleeding risk with coumadin so just make sure under 6000 a day (usually equivalent to 6 capsules a day).

## 2012-09-11 NOTE — Telephone Encounter (Signed)
Patient called and ask if she could take Tumeric and fish oil together because she can not take Celebrax because she is on Coumadin

## 2012-09-19 ENCOUNTER — Other Ambulatory Visit: Payer: Self-pay | Admitting: Family Medicine

## 2012-09-22 ENCOUNTER — Ambulatory Visit: Payer: Self-pay | Admitting: Family Medicine

## 2012-09-22 ENCOUNTER — Telehealth: Payer: Self-pay | Admitting: *Deleted

## 2012-09-22 DIAGNOSIS — I4891 Unspecified atrial fibrillation: Secondary | ICD-10-CM

## 2012-09-22 NOTE — Telephone Encounter (Signed)
I created anticoag notes and sent to inbasket. Tell her I am sorry I never saw her labs.

## 2012-09-22 NOTE — Telephone Encounter (Signed)
Pt notified of instructions and apologized for the error

## 2012-09-22 NOTE — Telephone Encounter (Signed)
Pt calls she had INR done on Tuesday but has not heard back on any instructions. I looked in the system and its under labs but looks like its just sitting there and no response has been made.

## 2012-10-09 ENCOUNTER — Ambulatory Visit (INDEPENDENT_AMBULATORY_CARE_PROVIDER_SITE_OTHER): Payer: Medicare Other | Admitting: Physician Assistant

## 2012-10-09 ENCOUNTER — Encounter: Payer: Self-pay | Admitting: Physician Assistant

## 2012-10-09 VITALS — BP 110/67 | HR 113 | Temp 97.6°F | Ht 63.0 in | Wt 155.0 lb

## 2012-10-09 DIAGNOSIS — R300 Dysuria: Secondary | ICD-10-CM

## 2012-10-09 DIAGNOSIS — N390 Urinary tract infection, site not specified: Secondary | ICD-10-CM

## 2012-10-09 LAB — POCT URINALYSIS DIPSTICK
Bilirubin, UA: NEGATIVE
Glucose, UA: NEGATIVE
Ketones, UA: NEGATIVE
Nitrite, UA: POSITIVE
Spec Grav, UA: 1.02

## 2012-10-09 MED ORDER — CIPROFLOXACIN HCL 500 MG PO TABS: 500.0000 mg | ORAL_TABLET | Freq: Two times a day (BID) | ORAL | Status: DC

## 2012-10-09 NOTE — Progress Notes (Signed)
  Subjective:    Patient ID: Terri Small, female    DOB: 11/06/1930, 78 y.o.   MRN: [id number]  Urinary Tract Infection  This is a new problem. The current episode started yesterday. The problem occurs every urination. The problem has been rapidly worsening. The quality of the pain is described as burning, shooting and aching. The pain is at a severity of 5/10. The pain is mild. There has been no fever. She is not sexually active. There is no history of pyelonephritis. Associated symptoms include frequency and urgency. Pertinent negatives include no chills, discharge, flank pain, hematuria, hesitancy, nausea, sweats or vomiting. She has tried increased fluids for the symptoms. The treatment provided no relief. Her past medical history is significant for recurrent UTIs.      Review of Systems  Constitutional: Negative for chills.  Gastrointestinal: Negative for nausea and vomiting.  Genitourinary: Positive for urgency and frequency. Negative for hesitancy, hematuria and flank pain.       Objective:   Physical Exam  Constitutional: She is oriented to person, place, and time. She appears well-developed and well-nourished.  HENT:  Head: Normocephalic and atraumatic.  Cardiovascular: Normal rate, regular rhythm and normal heart sounds.   Pulmonary/Chest: Effort normal and breath sounds normal.       No CVA tenderness.  Abdominal: Soft. There is no tenderness.  Neurological: She is alert and oriented to person, place, and time.  Skin: Skin is warm and dry.  Psychiatric: She has a normal mood and affect. Her behavior is normal.          Assessment & Plan:  UTI- UA positive for nitrates, leuks and blood. Treated with cipro for 7 days. Call if not improving after taking abx. Handout given on symptomatic care of UTI. Stay hydrated. Discussed with patient Cipro can increase concentrations of Coumadin in system. Come back on Thursday or Friday to have INR rechecked.

## 2012-10-09 NOTE — Patient Instructions (Addendum)
Come in on Thursday or Friday to have INR checked after being on Cipro.   Urinary Tract Infection Urinary tract infections (UTIs) can develop anywhere along your urinary tract. Your urinary tract is your body's drainage system for removing wastes and extra water. Your urinary tract includes two kidneys, two ureters, a bladder, and a urethra. Your kidneys are a pair of bean-shaped organs. Each kidney is about the size of your fist. They are located below your ribs, one on each side of your spine. CAUSES Infections are caused by microbes, which are microscopic organisms, including fungi, viruses, and bacteria. These organisms are so small that they can only be seen through a microscope. Bacteria are the microbes that most commonly cause UTIs. SYMPTOMS  Symptoms of UTIs may vary by age and gender of the patient and by the location of the infection. Symptoms in young women typically include a frequent and intense urge to urinate and a painful, burning feeling in the bladder or urethra during urination. Older women and men are more likely to be tired, shaky, and weak and have muscle aches and abdominal pain. A fever may mean the infection is in your kidneys. Other symptoms of a kidney infection include pain in your back or sides below the ribs, nausea, and vomiting. DIAGNOSIS To diagnose a UTI, your caregiver will ask you about your symptoms. Your caregiver also will ask to provide a urine sample. The urine sample will be tested for bacteria and white blood cells. White blood cells are made by your body to help fight infection. TREATMENT  Typically, UTIs can be treated with medication. Because most UTIs are caused by a bacterial infection, they usually can be treated with the use of antibiotics. The choice of antibiotic and length of treatment depend on your symptoms and the type of bacteria causing your infection. HOME CARE INSTRUCTIONS  If you were prescribed antibiotics, take them exactly as your  caregiver instructs you. Finish the medication even if you feel better after you have only taken some of the medication.  Drink enough water and fluids to keep your urine clear or pale yellow.  Avoid caffeine, tea, and carbonated beverages. They tend to irritate your bladder.  Empty your bladder often. Avoid holding urine for long periods of time.  Empty your bladder before and after sexual intercourse.  After a bowel movement, women should cleanse from front to back. Use each tissue only once. SEEK MEDICAL CARE IF:   You have back pain.  You develop a fever.  Your symptoms do not begin to resolve within 3 days. SEEK IMMEDIATE MEDICAL CARE IF:   You have severe back pain or lower abdominal pain.  You develop chills.  You have nausea or vomiting.  You have continued burning or discomfort with urination. MAKE SURE YOU:   Understand these instructions.  Will watch your condition.  Will get help right away if you are not doing well or get worse. Document Released: 07/14/2005 Document Revised: 04/04/2012 Document Reviewed: 11/12/2011 [facility] Patient Information 2013 [place], [place].

## 2012-10-13 ENCOUNTER — Ambulatory Visit: Payer: Self-pay | Admitting: Family Medicine

## 2012-10-13 ENCOUNTER — Other Ambulatory Visit: Payer: Self-pay | Admitting: Family Medicine

## 2012-10-13 DIAGNOSIS — I4891 Unspecified atrial fibrillation: Secondary | ICD-10-CM

## 2012-10-13 LAB — PROTIME-INR: INR: 1.95 — ABNORMAL HIGH (ref <1.50)

## 2012-10-13 NOTE — Progress Notes (Signed)
LMOM

## 2012-10-27 ENCOUNTER — Telehealth: Payer: Self-pay | Admitting: *Deleted

## 2012-10-27 ENCOUNTER — Ambulatory Visit: Payer: Self-pay | Admitting: Family Medicine

## 2012-10-27 ENCOUNTER — Other Ambulatory Visit: Payer: Self-pay | Admitting: Family Medicine

## 2012-10-27 DIAGNOSIS — I4891 Unspecified atrial fibrillation: Secondary | ICD-10-CM

## 2012-10-27 LAB — PROTIME-INR: Prothrombin Time: 22.3 seconds — ABNORMAL HIGH (ref 11.6–15.2)

## 2012-10-27 NOTE — Telephone Encounter (Signed)
Lab calls with inr results. PT= 22.3 INR= 2.11

## 2012-11-09 ENCOUNTER — Other Ambulatory Visit: Payer: Self-pay | Admitting: Family Medicine

## 2012-11-09 ENCOUNTER — Ambulatory Visit: Payer: Self-pay | Admitting: Family Medicine

## 2012-11-09 DIAGNOSIS — I4891 Unspecified atrial fibrillation: Secondary | ICD-10-CM

## 2012-11-09 LAB — PROTIME-INR: INR: 2.17 — ABNORMAL HIGH (ref <1.50)

## 2012-11-23 ENCOUNTER — Ambulatory Visit: Payer: Self-pay | Admitting: Family Medicine

## 2012-11-23 ENCOUNTER — Other Ambulatory Visit: Payer: Self-pay | Admitting: Family Medicine

## 2012-11-23 DIAGNOSIS — I4891 Unspecified atrial fibrillation: Secondary | ICD-10-CM

## 2012-11-23 NOTE — Progress Notes (Signed)
  Subjective:    Patient ID: Terri Small, female    DOB: 1931-08-03, 79 y.o.   MRN: [id number]  HPI    Review of Systems     Objective:   Physical Exam        Assessment & Plan:  Pt notified.  Verbalized understanding.

## 2012-11-28 ENCOUNTER — Encounter: Payer: Self-pay | Admitting: Family Medicine

## 2012-11-28 ENCOUNTER — Ambulatory Visit (INDEPENDENT_AMBULATORY_CARE_PROVIDER_SITE_OTHER): Payer: Medicare Other | Admitting: Family Medicine

## 2012-11-28 VITALS — BP 118/82 | HR 112 | Wt 150.1 lb

## 2012-11-28 DIAGNOSIS — M810 Age-related osteoporosis without current pathological fracture: Secondary | ICD-10-CM

## 2012-11-28 DIAGNOSIS — Z23 Encounter for immunization: Secondary | ICD-10-CM

## 2012-11-28 DIAGNOSIS — N390 Urinary tract infection, site not specified: Secondary | ICD-10-CM

## 2012-11-28 DIAGNOSIS — I4891 Unspecified atrial fibrillation: Secondary | ICD-10-CM

## 2012-11-28 DIAGNOSIS — Z13220 Encounter for screening for lipoid disorders: Secondary | ICD-10-CM

## 2012-11-28 DIAGNOSIS — R300 Dysuria: Secondary | ICD-10-CM

## 2012-11-28 LAB — POCT URINALYSIS DIPSTICK
Bilirubin, UA: NEGATIVE
Glucose, UA: NEGATIVE
Ketones, UA: NEGATIVE
Nitrite, UA: NEGATIVE
Spec Grav, UA: >=1.03
pH, UA: 5.5

## 2012-11-28 MED ORDER — CIPROFLOXACIN HCL 500 MG PO TABS: 500.0000 mg | ORAL_TABLET | Freq: Two times a day (BID) | ORAL | Status: DC

## 2012-11-28 MED ORDER — DILTIAZEM HCL ER COATED BEADS 240 MG PO CP24: 240.0000 mg | ORAL_CAPSULE | Freq: Every day | ORAL | Status: DC

## 2012-11-28 MED ORDER — WARFARIN SODIUM 5 MG PO TABS: 5.0000 mg | ORAL_TABLET | Freq: Every day | ORAL | Status: DC

## 2012-11-28 NOTE — Progress Notes (Signed)
  Subjective:    Patient ID: Terri Small, female    DOB: January 12, 1931, 79 y.o.   MRN: [id number]  HPI Possible UTI that started yesterday. Has been drinking more water. + dysuira. Noticed urine  Is cloudy.  No gross hematuria. She does have some stool incontinence.  No fever or back pain  A-fib - Had coumadin last week. She is due again next week before she leaves for her trip.  She is leaving for cancer to 3 months. Her grandson is getting married in getting his masters today stating for quite some time. They would like her prescriptions for everything. 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  Review of Systems     Objective:   Physical Exam  Constitutional: She is oriented to person, place, and time. She appears well-developed and well-nourished.  HENT:  Head: Normocephalic and atraumatic.  Cardiovascular: Normal rate, regular rhythm and normal heart sounds.   No carotid or abdominal bruits.  Pulmonary/Chest: Effort normal and breath sounds normal.  Musculoskeletal: She exhibits no edema.  Neurological: She is alert and oriented to person, place, and time.  Skin: Skin is warm and dry.  Psychiatric: She has a normal mood and affect. Her behavior is normal.          Assessment & Plan:  UTI - urinalysis is positive. We'll send it for culture. Will go ahead and treat with Cipro twice a day x7 days. She says typically a three-day courses not completely resolve her symptoms. Her last course of Cipro was about 6 weeks ago and she tolerated it well. If she is not noticing relief within the next 48 hours and encourage her call the office and we may change her to Keflex and she does have a sulfa allergy.  Atrial fibrillation-see recent Coumadin flow sheet. She's to followup next week for repeat PT/INR before her trip.  Osteoporosis-I. would like to commend vitamin D level to make sure that adequate. His been a very long time since we have done this.  Recommend  screening lipids and complete metabolic panel because of the medications that she takes. I do not have one on file since 2011. Next  Recommend shingles vaccine. She was told when she called her insurance company that they would cover it was in here in a doctors office. She says she wants to call more time just to confirm before having it done here because it is an expensive vaccine.  I also gave her a book called the "36 hour day", which is a fantastic book for family members to take care of Alzheimer's patients.

## 2012-11-29 ENCOUNTER — Other Ambulatory Visit: Payer: Self-pay | Admitting: Family Medicine

## 2012-11-29 LAB — COMPLETE METABOLIC PANEL WITH GFR
AST: 25 U/L (ref 0–37)
Albumin: 4.1 g/dL (ref 3.5–5.2)
BUN: 22 mg/dL (ref 6–23)
CO2: 26 mEq/L (ref 19–32)
Calcium: 9.1 mg/dL (ref 8.4–10.5)
Chloride: 104 mEq/L (ref 96–112)
Creat: 0.87 mg/dL (ref 0.50–1.10)
GFR, Est African American: 73 mL/min
GFR, Est Non African American: 63 mL/min
Glucose, Bld: 98 mg/dL (ref 70–99)
Potassium: 4.4 mEq/L (ref 3.5–5.3)

## 2012-11-29 LAB — LIPID PANEL
Cholesterol: 171 mg/dL (ref 0–200)
Triglycerides: 62 mg/dL (ref <150)
VLDL: 12 mg/dL (ref 0–40)

## 2012-12-01 LAB — URINE CULTURE
Colony Count: NO GROWTH
Organism ID, Bacteria: NO GROWTH

## 2012-12-02 LAB — URINE CULTURE

## 2012-12-12 ENCOUNTER — Ambulatory Visit: Payer: Self-pay | Admitting: Family Medicine

## 2012-12-12 ENCOUNTER — Other Ambulatory Visit: Payer: Self-pay | Admitting: Family Medicine

## 2012-12-12 DIAGNOSIS — I4891 Unspecified atrial fibrillation: Secondary | ICD-10-CM

## 2012-12-12 LAB — PROTIME-INR: Prothrombin Time: 25.6 seconds — ABNORMAL HIGH (ref 11.6–15.2)

## 2012-12-12 NOTE — Progress Notes (Signed)
  Subjective:    Patient ID: Terri Small, female    DOB: 1931-10-08, 79 y.o.   MRN: [id number]  HPI    Review of Systems     Objective:   Physical Exam        Assessment & Plan:  Patient notified of MD instructions. Barry Dienes, LPN

## 2012-12-14 ENCOUNTER — Other Ambulatory Visit: Payer: Self-pay | Admitting: Family Medicine

## 2013-01-03 LAB — PROTIME-INR: INR: 2.5 — AB (ref 0.9–1.1)

## 2013-01-05 ENCOUNTER — Ambulatory Visit: Payer: Self-pay | Admitting: Family Medicine

## 2013-01-05 ENCOUNTER — Telehealth: Payer: Self-pay | Admitting: *Deleted

## 2013-01-05 ENCOUNTER — Encounter: Payer: Self-pay | Admitting: *Deleted

## 2013-01-05 DIAGNOSIS — I4891 Unspecified atrial fibrillation: Secondary | ICD-10-CM

## 2013-01-05 NOTE — Progress Notes (Signed)
  Subjective:    Patient ID: Terri Small, female    DOB: [birth year], 79 y.o.   MRN: [id number]  HPI    Review of Systems     Objective:   Physical Exam        Assessment & Plan:  Left detailed message on [phone]

## 2013-01-05 NOTE — Telephone Encounter (Signed)
She is 79 yo. She doesn't have to do [employer] duty.  OK to provide letter.

## 2013-01-05 NOTE — Telephone Encounter (Signed)
Patient calls and states she called yesterday and needs a letter to get out of jury duty for [facility]. She is her husbands primary care giver and can not leave him and needs a letter stating this so doesn't have to do jury duty.

## 2013-01-05 NOTE — Telephone Encounter (Signed)
Called pt and informed her that her INR was 2.5 and that she was to continue on same dose. 2.5 mg on Tuesday. 5 mg all other days. Repeat in 5 weeks. Pt voiced understanding and agreed.Terri Small

## 2013-01-05 NOTE — Telephone Encounter (Signed)
Letter completed and pt notified. Barry Dienes, LPN

## 2013-01-08 ENCOUNTER — Telehealth: Payer: Self-pay

## 2013-01-08 NOTE — Telephone Encounter (Signed)
Ms. Self left a message on the nurse line: She wants to know if it is ok to use [place] [employer]. Would the medication interfere with any of the other medications she is taking? It is a cream for arthritis. The active ingredient is histamine dihydrochloride 0.025 %. She stated this cream works very well for her.

## 2013-01-08 NOTE — Telephone Encounter (Signed)
I don't see any contraindication with her medications. We will keep an eye on her Coumadin levels.

## 2013-01-09 NOTE — Telephone Encounter (Signed)
Left detailed message.   

## 2013-02-06 ENCOUNTER — Ambulatory Visit: Payer: Self-pay | Admitting: Family Medicine

## 2013-02-06 ENCOUNTER — Telehealth: Payer: Self-pay | Admitting: *Deleted

## 2013-02-06 DIAGNOSIS — I4891 Unspecified atrial fibrillation: Secondary | ICD-10-CM

## 2013-02-06 LAB — PROTIME-INR: INR: 1.8 — AB (ref 0.9–1.1)

## 2013-02-06 NOTE — Telephone Encounter (Signed)
Pt calls and states her INR was 1.8 and wants to know what she needs to do with her Coumadin

## 2013-02-06 NOTE — Telephone Encounter (Signed)
We will call to try find her where she had this drawn. I will go ahead and do a flow sheet. I would like [place] to get the original lab results faxed [place].

## 2013-02-06 NOTE — Progress Notes (Signed)
  Subjective:    Patient ID: Terri Small, female    DOB: 05-11-1931, 79 y.o.   MRN: [id number]  HPI    Review of Systems     Objective:   Physical Exam        Assessment & Plan:  Pt notified of MD instructions. Barry Dienes, LPN

## 2013-02-14 ENCOUNTER — Telehealth: Payer: Self-pay | Admitting: *Deleted

## 2013-02-14 NOTE — Telephone Encounter (Signed)
Left detailed message on vm.

## 2013-02-14 NOTE — Telephone Encounter (Signed)
Ok to stay on same dose but would recheck in 2 -3 weeks instead of a month.

## 2013-02-14 NOTE — Telephone Encounter (Signed)
Pt calls today from [place] & states that her INR was 2.3.  The provider there suggests she stay on the same dose & recheck in 1 month.  We will receive the paperwork via fax to confirm.

## 2013-03-01 ENCOUNTER — Ambulatory Visit: Payer: Self-pay | Admitting: Family Medicine

## 2013-03-01 ENCOUNTER — Telehealth: Payer: Self-pay | Admitting: *Deleted

## 2013-03-01 DIAGNOSIS — I4891 Unspecified atrial fibrillation: Secondary | ICD-10-CM

## 2013-03-01 NOTE — Telephone Encounter (Signed)
Pt calls and states her INR today was 2.9. Current dose of Coumadin is 5mg  x 6 days and 2.5mg  x 1 day

## 2013-03-01 NOTE — Telephone Encounter (Signed)
See anticoag floswheet

## 2013-03-01 NOTE — Progress Notes (Signed)
  Subjective:    Patient ID: Terri Small, female    DOB: September 28, 1931, 79 y.o.   MRN: [id number]  HPI    Review of Systems     Objective:   Physical Exam        Assessment & Plan:  Pt notified to continue same dose of Coumadin and recheck 2-3 weeks. Barry Dienes, LPN

## 2013-03-06 ENCOUNTER — Other Ambulatory Visit: Payer: Self-pay | Admitting: Family Medicine

## 2013-03-29 ENCOUNTER — Other Ambulatory Visit: Payer: Self-pay | Admitting: Family Medicine

## 2013-03-29 ENCOUNTER — Ambulatory Visit: Payer: Self-pay | Admitting: Family Medicine

## 2013-03-29 DIAGNOSIS — I4891 Unspecified atrial fibrillation: Secondary | ICD-10-CM

## 2013-03-29 LAB — PROTIME-INR
INR: 1.8 — ABNORMAL HIGH (ref <1.50)
Prothrombin Time: 19.7 s — ABNORMAL HIGH (ref 11.6–15.2)

## 2013-04-05 NOTE — Progress Notes (Signed)
  Subjective:    Patient ID: Terri Small, female    DOB: 1931/04/24, 79 y.o.   MRN: [id number] Left detailed message on patient's vm with coumadin instructions HPI    Review of Systems     Objective:   Physical Exam        Assessment & Plan:

## 2013-04-12 ENCOUNTER — Other Ambulatory Visit: Payer: Self-pay | Admitting: Family Medicine

## 2013-04-12 ENCOUNTER — Ambulatory Visit: Payer: Self-pay | Admitting: Family Medicine

## 2013-04-12 DIAGNOSIS — I4891 Unspecified atrial fibrillation: Secondary | ICD-10-CM

## 2013-04-12 LAB — PROTIME-INR
INR: 2.71 — ABNORMAL HIGH (ref <1.50)
Prothrombin Time: 26.8 seconds — ABNORMAL HIGH (ref 11.6–15.2)

## 2013-04-12 NOTE — Progress Notes (Signed)
  Subjective:    Patient ID: Terri Small, female    DOB: 1931/01/18, 79 y.o.   MRN: [id number]  HPI    Review of Systems     Objective:   Physical Exam        Assessment & Plan:  Pt notified of results and instructions. Barry Dienes, LPN

## 2013-04-27 ENCOUNTER — Other Ambulatory Visit: Payer: Self-pay | Admitting: Family Medicine

## 2013-04-27 ENCOUNTER — Ambulatory Visit: Payer: Self-pay | Admitting: Family Medicine

## 2013-04-27 DIAGNOSIS — I4891 Unspecified atrial fibrillation: Secondary | ICD-10-CM

## 2013-04-27 LAB — PROTIME-INR
INR: 2.55 — ABNORMAL HIGH (ref <1.50)
Prothrombin Time: 26.4 seconds — ABNORMAL HIGH (ref 11.6–15.2)

## 2013-05-07 NOTE — Progress Notes (Signed)
  Subjective:    Patient ID: Terri Small, female    DOB: 12-03-1930, 79 y.o.   MRN: [id number]  HPI    Review of Systems     Objective:   Physical Exam        Assessment & Plan:  Called pt and LM of Coumadin instructions on VM. Instructed to call back if any questions. Barry Dienes, LPN

## 2013-05-21 ENCOUNTER — Other Ambulatory Visit: Payer: Self-pay | Admitting: Family Medicine

## 2013-05-31 ENCOUNTER — Ambulatory Visit (INDEPENDENT_AMBULATORY_CARE_PROVIDER_SITE_OTHER): Payer: Medicare Other | Admitting: Family Medicine

## 2013-05-31 ENCOUNTER — Encounter: Payer: Self-pay | Admitting: Family Medicine

## 2013-05-31 ENCOUNTER — Ambulatory Visit: Payer: Self-pay | Admitting: Family Medicine

## 2013-05-31 VITALS — BP 93/68 | HR 114 | Wt 157.0 lb

## 2013-05-31 DIAGNOSIS — E559 Vitamin D deficiency, unspecified: Secondary | ICD-10-CM

## 2013-05-31 DIAGNOSIS — M8580 Other specified disorders of bone density and structure, unspecified site: Secondary | ICD-10-CM

## 2013-05-31 DIAGNOSIS — I4891 Unspecified atrial fibrillation: Secondary | ICD-10-CM

## 2013-05-31 DIAGNOSIS — K219 Gastro-esophageal reflux disease without esophagitis: Secondary | ICD-10-CM

## 2013-05-31 DIAGNOSIS — M949 Disorder of cartilage, unspecified: Secondary | ICD-10-CM

## 2013-05-31 DIAGNOSIS — M899 Disorder of bone, unspecified: Secondary | ICD-10-CM

## 2013-05-31 LAB — POCT INR: INR: 1.4

## 2013-05-31 MED ORDER — AMBULATORY NON FORMULARY MEDICATION: Status: DC

## 2013-05-31 NOTE — Patient Instructions (Signed)

## 2013-05-31 NOTE — Progress Notes (Signed)
Subjective:    Patient ID: Terri Small, female    DOB: 1931/01/28, 79 y.o.   MRN: [id number]  HPI Mad at herself for gaining weight. She has been having a lot of GERD sxs with intense chest pain. Says it waking her up at night.  She hasn't tried anything. Says she been a liftetime member for of [employer]. Occ radiates into her left breast.    A-fib no SOB.  Her cardiology appt is in October. She's taking her Coumadin regularly though she did miss her dose on Tuesday. No excessive bruising or bleeding.  Reflux-she's had some severe heartburn. She says in fact even waking her up at night. She has been eating a lot of fruit lately and does drink coffee. Is she tries to buy the caffeine free. He wants and if I can give her handout on some dietary measures. She's not currently taking any medication. She did take Nexium several years ago for short period of time it was helpful. She has seen it is now over-the-counter wonders if she should try. She has not tried any Maisie Fus or Maalox at home on her own. No known worsening or alleviating symptoms.  Osteopenia-patient says that years ago she was actually diagnosed with osteoporosis. After treatment she was then upgraded to osteopenia. Her last bone density test was about 5 years ago up [place].  Review of Systems BP 93/68  Pulse 114  Wt 157 lb (71.215 kg)  BMI 27.82 kg/m2    Allergies  Allergen Reactions  . Sulfonamide Derivatives     REACTION: rash- burning on face    Past Medical History  Diagnosis Date  . 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  . Osteoporosis   . Herpes zoster     Past Surgical History  Procedure Laterality Date  . Abdominal hysterectomy      Vaginal  . Cholecystectomy      Laparoscopic  . Cardiac catheterization      W/ Ablation    History   Social History  . Marital Status: Married    Spouse Name: [name]    Number of Children: 4  . Years  of Education: N/A   Occupational History  . Retired     Social History Main Topics  . Smoking status: Former Smoker    Quit date: 10/19/1959  . Smokeless tobacco: Not on file  . Alcohol Use: 1.0 oz/week    2 drink(s) per week  . Drug Use: No  . Sexual Activity: Not on file   Other Topics Concern  . Not on file   Social History Narrative      Retired.  Some college. Married to her husband  Jonny Ruiz who has mod Archivist.   Has 4 adult children. Lives with his daughter.     Regular exercise-yes    Family History  Problem Relation Age of Onset  . Stroke Father   . Atrial fibrillation    . Heart disease Mother     Atrial Fibrillation    Outpatient Encounter Prescriptions as of 05/31/2013  Medication Sig Dispense Refill  . AMBULATORY NON FORMULARY MEDICATION Medication Name: PT/INR check weekly. Dx: Atrial fibrillation.  1 Units  PRN  . Cholecalciferol (VITAMIN D) 2000 UNITS CAPS Take 1 capsule by mouth daily.      Marland Kitchen diltiazem (CARDIZEM CD) 240 MG 24 hr capsule Take 1 capsule (240 mg total) by mouth daily.  90 capsule  0  . glucosamine-chondroitin 500-400 MG tablet Take 1 tablet by mouth 2 (two) times daily.      Marland Kitchen warfarin (COUMADIN) 1 MG tablet Take 1 tablet (1 mg total) by mouth daily.  30 tablet  5  . warfarin (COUMADIN) 5 MG tablet TAKE 1 TABLET BY MOUTH EVERY DAY  90 tablet  5  . AMBULATORY NON FORMULARY MEDICATION Medication Name: Zostavax IM x 1  1 vial  0  . [DISCONTINUED] ciprofloxacin (CIPRO) 500 MG tablet Take 1 tablet (500 mg total) by mouth 2 (two) times daily. For 7 days.  14 tablet  0  . [DISCONTINUED] diltiazem (CARDIZEM CD) 240 MG 24 hr capsule TAKE ONE CAPSULE BY MOUTH EVERY DAY  90 capsule  0  . [DISCONTINUED] Turmeric 500 MG CAPS Take 1 capsule by mouth daily.  1 capsule  0   No facility-administered encounter medications on file as of 05/31/2013.          Objective:   Physical Exam  Constitutional: She is oriented to person, place, and time. She  appears well-developed and well-nourished.  HENT:  Head: Normocephalic and atraumatic.  Mouth/Throat: Oropharynx is clear and moist.  Eyes: Conjunctivae are normal. Pupils are equal, round, and reactive to light.  Neck: Neck supple. No thyromegaly present.  Cardiovascular: Normal rate, regular rhythm and normal heart sounds.   Pulmonary/Chest: Effort normal and breath sounds normal.  Abdominal: Soft. Bowel sounds are normal. She exhibits no distension and no mass. There is no tenderness. There is no rebound and no guarding.  Lymphadenopathy:    She has no cervical adenopathy.  Neurological: She is alert and oriented to person, place, and time.  Skin: Skin is warm and dry.  Psychiatric: She has a normal mood and affect. Her behavior is normal.          Assessment & Plan:  GERD - discussed treatment options. Recommend a trial of a PPI for 6 weeks. At that point she can discontinue it if her symptoms well controlled. Strongly recommended some changes in her diet. Handout given and reviewed things to avoid. Call if any problems or if symptoms are not resolving. She still having some breakthrough symptoms on the PPI and with dietary changes then recommend referral to GI for possible endoscopy.  A-fib - overall she's doing well. She's currently asymptomatic. Please see and regulation flowsheet for adjustments to her Coumadin today. She also has a followup with cardiology later this fall.   Osteopenia-due for bone density. Will schedule here in our building. Continue vitamin D supplementation. Also encourage her to take calcium regularly if she does not get 4 servings of dairy a day. She says she does occasionally take it but not consistently.  Recommend shingles vaccine. Prescription given to that she can get at the pharmacy.  Vitamin D deficiency-recheck Coumadin level today.

## 2013-05-31 NOTE — Progress Notes (Signed)
INR done today. Pt stated that she missed 1 dose on Tuesday. No bruising,CP, or SOB.Terri Small [place]

## 2013-06-04 ENCOUNTER — Other Ambulatory Visit: Payer: Self-pay | Admitting: Family Medicine

## 2013-06-05 ENCOUNTER — Ambulatory Visit (INDEPENDENT_AMBULATORY_CARE_PROVIDER_SITE_OTHER): Payer: Medicare Other

## 2013-06-05 DIAGNOSIS — M8580 Other specified disorders of bone density and structure, unspecified site: Secondary | ICD-10-CM

## 2013-06-05 DIAGNOSIS — M899 Disorder of bone, unspecified: Secondary | ICD-10-CM

## 2013-06-05 LAB — BASIC METABOLIC PANEL WITH GFR
BUN: 24 mg/dL — ABNORMAL HIGH (ref 6–23)
Creat: 0.96 mg/dL (ref 0.50–1.10)
GFR, Est African American: 65 mL/min
GFR, Est Non African American: 56 mL/min — ABNORMAL LOW
Potassium: 4.4 mEq/L (ref 3.5–5.3)

## 2013-06-07 ENCOUNTER — Other Ambulatory Visit: Payer: Self-pay | Admitting: Family Medicine

## 2013-06-07 MED ORDER — ALENDRONATE SODIUM 70 MG PO TABS: 70.0000 mg | ORAL_TABLET | ORAL | Status: DC

## 2013-06-14 ENCOUNTER — Ambulatory Visit: Payer: Self-pay | Admitting: Family Medicine

## 2013-06-14 ENCOUNTER — Other Ambulatory Visit: Payer: Self-pay | Admitting: Family Medicine

## 2013-06-14 DIAGNOSIS — I4891 Unspecified atrial fibrillation: Secondary | ICD-10-CM

## 2013-06-14 MED ORDER — WARFARIN SODIUM 4 MG PO TABS: 4.0000 mg | ORAL_TABLET | Freq: Every day | ORAL | Status: DC | PRN

## 2013-06-15 NOTE — Progress Notes (Signed)
  Subjective:    Patient ID: Terri Small, female    DOB: 1930/11/08, 79 y.o.   MRN: [id number]  HPI    Review of Systems     Objective:   Physical Exam        Assessment & Plan:  Pt notified of Coumadin dose change and instructions. Barry Dienes, LPN

## 2013-06-27 ENCOUNTER — Ambulatory Visit (INDEPENDENT_AMBULATORY_CARE_PROVIDER_SITE_OTHER): Payer: Medicare Other

## 2013-06-27 ENCOUNTER — Encounter: Payer: Self-pay | Admitting: Physician Assistant

## 2013-06-27 ENCOUNTER — Ambulatory Visit (INDEPENDENT_AMBULATORY_CARE_PROVIDER_SITE_OTHER): Payer: Medicare Other | Admitting: Physician Assistant

## 2013-06-27 VITALS — BP 100/67 | HR 118 | Wt 156.0 lb

## 2013-06-27 DIAGNOSIS — R0602 Shortness of breath: Secondary | ICD-10-CM

## 2013-06-27 DIAGNOSIS — R05 Cough: Secondary | ICD-10-CM

## 2013-06-27 DIAGNOSIS — R0789 Other chest pain: Secondary | ICD-10-CM

## 2013-06-27 DIAGNOSIS — I517 Cardiomegaly: Secondary | ICD-10-CM

## 2013-06-27 DIAGNOSIS — R059 Cough, unspecified: Secondary | ICD-10-CM

## 2013-06-27 NOTE — Patient Instructions (Addendum)
If chest tightness/cough continues and xray normal. Schedule appt for spirometry.   Hiatal Hernia A hiatal hernia occurs when a part of the stomach slides above the diaphragm. The diaphragm is the thin muscle separating the belly (abdomen) from the chest. A hiatal hernia can be something you are born with or develop over time. Hiatal hernias may allow stomach acid to flow back into your esophagus, the tube which carries food from your mouth to your stomach. If this acid causes problems it is called GERD (gastro-esophageal reflux disease).  SYMPTOMS  Common symptoms of GERD are heartburn (burning in your chest). This is worse when lying down or bending over. It may also cause belching and indigestion. Some of the things which make GERD worse are:  Increased weight pushes on stomach making acid rise more easily.  Smoking markedly increases acid production.  Alcohol decreases lower esophageal sphincter pressure (valve between stomach and esophagus), allowing acid from stomach into esophagus.  Late evening meals and going to bed with a full stomach increases pressure.  Anything that causes an increase in acid production.  Lower esophageal sphincter incompetence. DIAGNOSIS  Hiatal hernia is often diagnosed with x-rays of your stomach and small bowel. This is called an UGI (upper gastrointestinal x-ray). Sometimes a gastroscopic procedure is done. This is a procedure where your caregiver uses a flexible instrument to look into the stomach and small bowel. HOME CARE INSTRUCTIONS   Try to achieve and maintain an ideal body weight.  Avoid drinking alcoholic beverages.  Stop smoking.  Put the head of your bed on 4 to 6 inch blocks. This will keep your head and esophagus higher than your stomach. If you cannot use blocks, sleep with several pillows under your head and shoulders.  Over-the-counter medications will decrease acid production. Your caregiver can also prescribe medications for this.  Take as directed.  1/2 to 1 teaspoon of an antacid taken every hour while awake, with meals and at bedtime, will neutralize acid.  Do not take aspirin, ibuprofen (Advil or Motrin), or other nonsteroidal anti-inflammatory drugs.  Do not wear tight clothing around your chest or stomach.  Eat smaller meals and eat more frequently. This keeps your stomach from getting too full. Eat slowly.  Do not lie down for 2 or 3 hours after eating. Do not eat or drink anything 1 to 2 hours before going to bed.  Avoid caffeine beverages (colas, coffee, cocoa, tea), fatty foods, citrus fruits and all other foods and drinks that contain acid and that seem to increase the problems.  Avoid bending over, especially after eating. Also avoid straining during bowel movements or when urinating or lifting things. Anything that increases the pressure in your belly increases the amount of acid that may be pushed up into your esophagus. SEEK IMMEDIATE MEDICAL CARE IF:  There is change in location (pain in arms, neck, jaw, teeth or back) of your pain, or the pain is getting worse.  You also experience nausea, vomiting, sweating (diaphoresis), or shortness of breath.  You develop continual vomiting, vomit blood or coffee ground material, have bright red blood in your stools, or have black tarry stools. Some of these symptoms could signal other problems such as heart disease. MAKE SURE YOU:   Understand these instructions.  Monitor your condition.  Contact your caregiver if you are not doing well or are getting worse. Document Released: 12/25/2003 Document Revised: 12/27/2011 Document Reviewed: 10/04/2005 [facility] Patient Information 2014 [place], [place].

## 2013-06-27 NOTE — Progress Notes (Signed)
  Subjective:    Patient ID: Terri Small, female    DOB: 12-24-1930, 79 y.o.   MRN: [id number]  HPI  Patient presents to the clinic with shortness of breath, cough, chest pressure for the last 4 weeks. She thought Nexium was causing symptoms and stopped after 2 weeks. She did say most of the heaviness did resolve when she stopped nexium she has still had coughing and shortness of breath. She has had a little more difficultly doing things that she normally does. She has had some production with cough. She has been sent to pulmonary rehab in the past and did well. She does not have to sleep on any pillows and does not wake up at night SOB. She is not on any inhalers. Denies any fever, chills, sinus pressure or ear pain. Chest pressure is low and in the epigastric region. She admits to some acid reflux but not able to tolerate nexium.    Review of Systems     Objective:   Physical Exam  Constitutional: She is oriented to person, place, and time. She appears well-developed and well-nourished.  HENT:  Head: Normocephalic and atraumatic.  Right Ear: External ear normal.  Left Ear: External ear normal.  Nose: Nose normal.  Mouth/Throat: Oropharynx is clear and moist.  Eyes: Conjunctivae and EOM are normal. Pupils are equal, round, and reactive to light.  Neck: Normal range of motion. Neck supple.  Cardiovascular: Regular rhythm and normal heart sounds.   Tachycardia at 118. Recheck and had decreased to 105.  Pulmonary/Chest: Effort normal and breath sounds normal. She has no wheezes. She has no rales.  Lymphadenopathy:    She has no cervical adenopathy.  Neurological: She is alert and oriented to person, place, and time.  Skin: Skin is warm and dry.  Psychiatric: She has a normal mood and affect. Her behavior is normal.          Assessment & Plan:  Cough/SOB/chest pressure- chest x-ray was negative for any acute abnormalities but did show some hyperinflation of the lungs. Patient was  called and made aware that this could be signs of COPD. patient was encouraged to come to make an appointment for spirometry. Also did give handout of hiatal hernia for patient to look at and see if this could be causing some of her symptoms. Did recommend her trying Zantac or Prilosec OTC and see if helps if coughing continues. Perhaps these medications would not react like Nexium dilatation.

## 2013-06-28 ENCOUNTER — Ambulatory Visit: Payer: Self-pay | Admitting: Family Medicine

## 2013-06-28 ENCOUNTER — Other Ambulatory Visit: Payer: Self-pay | Admitting: Family Medicine

## 2013-06-28 DIAGNOSIS — I4891 Unspecified atrial fibrillation: Secondary | ICD-10-CM

## 2013-06-28 LAB — PROTIME-INR: INR: 2.5 — ABNORMAL HIGH (ref <1.50)

## 2013-06-29 ENCOUNTER — Ambulatory Visit: Payer: Self-pay | Admitting: Family Medicine

## 2013-06-29 ENCOUNTER — Telehealth: Payer: Self-pay | Admitting: *Deleted

## 2013-06-29 DIAGNOSIS — I4891 Unspecified atrial fibrillation: Secondary | ICD-10-CM

## 2013-06-29 LAB — PROTIME-INR: INR: 2.5 — AB (ref 0.9–1.1)

## 2013-06-29 NOTE — Telephone Encounter (Signed)
Message given to granddaughter Morrie Sheldon.Same dose. 4mg  on Friday. 5 mg all other days. Repeat in 3 weeks. Laureen Ochs, Viann [place]

## 2013-07-02 ENCOUNTER — Ambulatory Visit: Payer: Federal, State, Local not specified - PPO | Admitting: Family Medicine

## 2013-07-03 ENCOUNTER — Ambulatory Visit (INDEPENDENT_AMBULATORY_CARE_PROVIDER_SITE_OTHER): Payer: Medicare Other | Admitting: Family Medicine

## 2013-07-03 ENCOUNTER — Encounter: Payer: Self-pay | Admitting: Family Medicine

## 2013-07-03 VITALS — BP 104/79 | HR 102 | Temp 98.0°F | Ht 63.0 in | Wt 153.0 lb

## 2013-07-03 DIAGNOSIS — J439 Emphysema, unspecified: Secondary | ICD-10-CM

## 2013-07-03 DIAGNOSIS — J438 Other emphysema: Secondary | ICD-10-CM

## 2013-07-03 DIAGNOSIS — J8410 Pulmonary fibrosis, unspecified: Secondary | ICD-10-CM | POA: Insufficient documentation

## 2013-07-03 DIAGNOSIS — N390 Urinary tract infection, site not specified: Secondary | ICD-10-CM

## 2013-07-03 DIAGNOSIS — Z23 Encounter for immunization: Secondary | ICD-10-CM

## 2013-07-03 LAB — POCT URINALYSIS DIPSTICK
Glucose, UA: NEGATIVE
Nitrite, UA: NEGATIVE
Protein, UA: NEGATIVE
Spec Grav, UA: 1.025
Urobilinogen, UA: 0.2

## 2013-07-03 MED ORDER — CIPROFLOXACIN HCL 500 MG PO TABS: 500.0000 mg | ORAL_TABLET | Freq: Two times a day (BID) | ORAL | Status: AC

## 2013-07-03 NOTE — Progress Notes (Signed)
  Subjective:    Patient ID: Terri Small, female    DOB: 10/20/1930, 79 y.o.   MRN: [id number]  HPI  See notes from Last OV - Here for spirometry:   "Patient presents to the clinic with shortness of breath, cough, chest pressure for the last 4 weeks. She thought Nexium was causing symptoms and stopped after 2 weeks. She did say most of the heaviness did resolve when she stopped nexium she has still had coughing and shortness of breath. She has had a little more difficultly doing things that she normally does. She has had some production with cough. She has been sent to pulmonary rehab in the past and did well. She does not have to sleep on any pillows and does not wake up at night SOB. She is not on any inhalers. Denies any fever, chills, sinus pressure or ear pain. Chest pressure is low and in the epigastric region. She admits to some acid reflux but not able to tolerate nexium".   She brought a previous spirometry report from 2008 with her today. She is feeling some better today. Her chest heaviness and SOB is better. Stil having a lot of phelgm and mucous production in the AM in her throat.   Review of Systems     Objective:   Physical Exam  Constitutional: She is oriented to person, place, and time. She appears well-developed and well-nourished.  HENT:  Head: Normocephalic and atraumatic.  Cardiovascular: Normal rate, regular rhythm and normal heart sounds.   Pulmonary/Chest: Effort normal and breath sounds normal.  Neurological: She is alert and oriented to person, place, and time.  Skin: Skin is warm and dry.  Psychiatric: She has a normal mood and affect. Her behavior is normal.          Assessment & Plan:  SOB - she is feeling some better with the SOB and chest heaviness.  She is still c/o excess mucous in her thrat in the morning. wil have to clear her throat for several hours after wakes up in AM. Discssed that excess mucous can be with several medication.  Diagnosed with  emphysema years ago. Spirometry 07/03/2013 shows an FVC of 97%, FEV1 of 102%, ratio 76%. No significant response to albuterol. Normal spirometry.  CXR was normal.   UTI  - will tx with Cipro. Call if not better in 5 days.

## 2013-07-03 NOTE — Patient Instructions (Signed)
Urinary Tract Infection  Urinary tract infections (UTIs) can develop anywhere along your urinary tract. Your urinary tract is your body's drainage system for removing wastes and extra water. Your urinary tract includes two kidneys, two ureters, a bladder, and a urethra. Your kidneys are a pair of bean-shaped organs. Each kidney is about the size of your fist. They are located below your ribs, one on each side of your spine.  CAUSES  Infections are caused by microbes, which are microscopic organisms, including fungi, viruses, and bacteria. These organisms are so small that they can only be seen through a microscope. Bacteria are the microbes that most commonly cause UTIs.  SYMPTOMS   Symptoms of UTIs may vary by age and gender of the patient and by the location of the infection. Symptoms in young women typically include a frequent and intense urge to urinate and a painful, burning feeling in the bladder or urethra during urination. Older women and men are more likely to be tired, shaky, and weak and have muscle aches and abdominal pain. A fever may mean the infection is in your kidneys. Other symptoms of a kidney infection include pain in your back or sides below the ribs, nausea, and vomiting.  DIAGNOSIS  To diagnose a UTI, your caregiver will ask you about your symptoms. Your caregiver also will ask to provide a urine sample. The urine sample will be tested for bacteria and white blood cells. White blood cells are made by your body to help fight infection.  TREATMENT   Typically, UTIs can be treated with medication. Because most UTIs are caused by a bacterial infection, they usually can be treated with the use of antibiotics. The choice of antibiotic and length of treatment depend on your symptoms and the type of bacteria causing your infection.  HOME CARE INSTRUCTIONS   If you were prescribed antibiotics, take them exactly as your caregiver instructs you. Finish the medication even if you feel better after you  have only taken some of the medication.   Drink enough water and fluids to keep your urine clear or pale yellow.   Avoid caffeine, tea, and carbonated beverages. They tend to irritate your bladder.   Empty your bladder often. Avoid holding urine for long periods of time.   Empty your bladder before and after sexual intercourse.   After a bowel movement, women should cleanse from front to back. Use each tissue only once.  SEEK MEDICAL CARE IF:    You have back pain.   You develop a fever.   Your symptoms do not begin to resolve within 3 days.  SEEK IMMEDIATE MEDICAL CARE IF:    You have severe back pain or lower abdominal pain.   You develop chills.   You have nausea or vomiting.   You have continued burning or discomfort with urination.  MAKE SURE YOU:    Understand these instructions.   Will watch your condition.   Will get help right away if you are not doing well or get worse.  Document Released: 07/14/2005 Document Revised: 04/04/2012 Document Reviewed: 11/12/2011  [employer] Patient Information 2014 [employer].

## 2013-07-04 ENCOUNTER — Encounter: Payer: Self-pay | Admitting: *Deleted

## 2013-07-15 ENCOUNTER — Other Ambulatory Visit: Payer: Self-pay | Admitting: Family Medicine

## 2013-07-17 ENCOUNTER — Encounter: Payer: Self-pay | Admitting: Family Medicine

## 2013-07-17 ENCOUNTER — Ambulatory Visit (INDEPENDENT_AMBULATORY_CARE_PROVIDER_SITE_OTHER): Payer: Medicare Other | Admitting: Family Medicine

## 2013-07-17 ENCOUNTER — Ambulatory Visit: Payer: Self-pay | Admitting: Family Medicine

## 2013-07-17 ENCOUNTER — Other Ambulatory Visit: Payer: Self-pay | Admitting: Family Medicine

## 2013-07-17 VITALS — BP 126/86 | HR 98 | Wt 154.0 lb

## 2013-07-17 DIAGNOSIS — I4891 Unspecified atrial fibrillation: Secondary | ICD-10-CM

## 2013-07-17 DIAGNOSIS — N390 Urinary tract infection, site not specified: Secondary | ICD-10-CM

## 2013-07-17 LAB — POCT URINALYSIS DIPSTICK
Nitrite, UA: NEGATIVE
Protein, UA: NEGATIVE
Urobilinogen, UA: 0.2
pH, UA: 6

## 2013-07-17 LAB — PROTIME-INR: Prothrombin Time: 24.5 seconds — ABNORMAL HIGH (ref 11.6–15.2)

## 2013-07-17 MED ORDER — LEVOFLOXACIN 250 MG PO TABS: 250.0000 mg | ORAL_TABLET | Freq: Every day | ORAL | Status: DC

## 2013-07-17 NOTE — Progress Notes (Signed)
  Subjective:    Patient ID: Terri Small, female    DOB: 1931-06-24, 79 y.o.   MRN: [id number] Pt notified. HPI    Review of Systems     Objective:   Physical Exam        Assessment & Plan:

## 2013-07-17 NOTE — Progress Notes (Signed)
  Subjective:    Patient ID: Terri Small, female    DOB: 07/01/1931, 79 y.o.   MRN: [id number]  HPI Last UTI was 2 weeks ago and tx with Cipro 500mg  and says felt better for about a week and then started about 4 days ago started getting pelvic pain and discomfort. Using tylenol for pain relief.  No dysuria.    Review of Systems     Objective:   Physical Exam  Constitutional: She appears well-developed and well-nourished.  Musculoskeletal:  No cva tenderness  Skin: Skin is warm and dry.  Psychiatric: She has a normal mood and affect. Her behavior is normal.          Assessment & Plan:  UTI - urinalysis was positive for trace leukocytes. We'll send for culture since her last infection was only 2 weeks ago and she just completed her antibiotics about 10 days ago. This is concerning for possibly a resistant infection or could be completely new infection. We did discuss potentially the option of prophylaxis as well and can consider this. For now we'll go ahead and treat with Levaquin 250 mg x5 days. Please call if not symptomatically improving.

## 2013-07-23 ENCOUNTER — Other Ambulatory Visit: Payer: Self-pay | Admitting: *Deleted

## 2013-07-23 ENCOUNTER — Other Ambulatory Visit: Payer: Self-pay | Admitting: Family Medicine

## 2013-07-23 DIAGNOSIS — N390 Urinary tract infection, site not specified: Secondary | ICD-10-CM

## 2013-07-23 MED ORDER — NITROFURANTOIN MONOHYD MACRO 100 MG PO CAPS: 100.0000 mg | ORAL_CAPSULE | Freq: Two times a day (BID) | ORAL | Status: AC

## 2013-08-02 ENCOUNTER — Other Ambulatory Visit: Payer: Self-pay | Admitting: *Deleted

## 2013-08-02 DIAGNOSIS — N390 Urinary tract infection, site not specified: Secondary | ICD-10-CM

## 2013-08-05 ENCOUNTER — Other Ambulatory Visit: Payer: Self-pay | Admitting: Family Medicine

## 2013-08-06 ENCOUNTER — Telehealth: Payer: Self-pay | Admitting: *Deleted

## 2013-08-06 ENCOUNTER — Other Ambulatory Visit: Payer: Self-pay | Admitting: *Deleted

## 2013-08-06 DIAGNOSIS — N390 Urinary tract infection, site not specified: Secondary | ICD-10-CM

## 2013-08-06 NOTE — Telephone Encounter (Signed)
Pt informed that there wasn't a label on her urine and that she will need to come back in for recollection. Pt voiced understanding and agreed.Terri Small [place]

## 2013-08-07 ENCOUNTER — Telehealth: Payer: Self-pay | Admitting: *Deleted

## 2013-08-07 MED ORDER — NITROFURANTOIN MONOHYD MACRO 100 MG PO CAPS: 100.0000 mg | ORAL_CAPSULE | Freq: Two times a day (BID) | ORAL | Status: AC

## 2013-08-07 NOTE — Telephone Encounter (Signed)
Pt informed.Terri Small  

## 2013-08-07 NOTE — Telephone Encounter (Signed)
Sent to [facility] to the pharmacy. She recently had a course of ciprofloxacin.

## 2013-08-07 NOTE — Telephone Encounter (Signed)
Pt called and stated that she is having dysuria and would like to have a 10 day ABX called into her pharmacy. She came in yesterday and submitted a Ucx for recollection and testing and those results are not in as of yet.

## 2013-08-16 DIAGNOSIS — J449 Chronic obstructive pulmonary disease, unspecified: Secondary | ICD-10-CM | POA: Insufficient documentation

## 2013-08-16 DIAGNOSIS — K219 Gastro-esophageal reflux disease without esophagitis: Secondary | ICD-10-CM | POA: Insufficient documentation

## 2013-08-22 ENCOUNTER — Other Ambulatory Visit: Payer: Self-pay | Admitting: Family Medicine

## 2013-08-23 ENCOUNTER — Telehealth: Payer: Self-pay | Admitting: *Deleted

## 2013-08-23 DIAGNOSIS — N390 Urinary tract infection, site not specified: Secondary | ICD-10-CM

## 2013-08-23 NOTE — Telephone Encounter (Signed)
Pt completed abx for UTI. Need to collect urine for a culture per Dr. Shelah Lewandowsky lab note. Sent lab order downstairs for urine culture

## 2013-08-27 ENCOUNTER — Encounter: Payer: Self-pay | Admitting: Family Medicine

## 2013-08-27 DIAGNOSIS — I272 Pulmonary hypertension, unspecified: Secondary | ICD-10-CM | POA: Insufficient documentation

## 2013-08-27 DIAGNOSIS — I5030 Unspecified diastolic (congestive) heart failure: Secondary | ICD-10-CM | POA: Insufficient documentation

## 2013-08-27 DIAGNOSIS — IMO0002 Reserved for concepts with insufficient information to code with codable children: Secondary | ICD-10-CM | POA: Insufficient documentation

## 2013-08-28 ENCOUNTER — Ambulatory Visit: Payer: Self-pay | Admitting: Family Medicine

## 2013-08-28 ENCOUNTER — Other Ambulatory Visit: Payer: Self-pay | Admitting: Family Medicine

## 2013-08-28 DIAGNOSIS — I4891 Unspecified atrial fibrillation: Secondary | ICD-10-CM

## 2013-09-07 DIAGNOSIS — R079 Chest pain, unspecified: Secondary | ICD-10-CM | POA: Insufficient documentation

## 2013-09-09 ENCOUNTER — Other Ambulatory Visit: Payer: Self-pay | Admitting: Family Medicine

## 2013-09-11 ENCOUNTER — Encounter: Payer: Self-pay | Admitting: Family Medicine

## 2013-09-12 ENCOUNTER — Ambulatory Visit (INDEPENDENT_AMBULATORY_CARE_PROVIDER_SITE_OTHER): Payer: Medicare Other | Admitting: Family Medicine

## 2013-09-12 ENCOUNTER — Encounter: Payer: Self-pay | Admitting: Family Medicine

## 2013-09-12 VITALS — BP 119/78 | HR 83 | Temp 97.5°F | Wt 154.0 lb

## 2013-09-12 DIAGNOSIS — R3989 Other symptoms and signs involving the genitourinary system: Secondary | ICD-10-CM

## 2013-09-12 DIAGNOSIS — N390 Urinary tract infection, site not specified: Secondary | ICD-10-CM

## 2013-09-12 LAB — POCT URINALYSIS DIPSTICK
Bilirubin, UA: NEGATIVE
Blood, UA: NEGATIVE
Glucose, UA: NEGATIVE
Spec Grav, UA: >=1.03
pH, UA: 5.5

## 2013-09-12 MED ORDER — CEFTRIAXONE SODIUM 1 G IJ SOLR
1.0000 g | Freq: Once | INTRAMUSCULAR | Status: AC
  Administered 2013-09-12: 1 g via INTRAMUSCULAR

## 2013-09-12 MED ORDER — CEPHALEXIN 500 MG PO CAPS: 500.0000 mg | ORAL_CAPSULE | Freq: Four times a day (QID) | ORAL | Status: AC

## 2013-09-12 NOTE — Progress Notes (Signed)
CC: Terri Small is a 79 y.o. female is here for Urinary Tract Infection   Subjective: HPI:  Complains of urinary frequency and urgency and dysuria at the urethra that has been present for a daily basis for the last 2 days worsening on a daily basis. Above symptoms are moderate in severity. Can occur anytime of the day. Nothing particularly makes better or worse no interventions as of yet. She recently stopped nitrofurantoin for UTI in October. She believes symptoms were completely resolved up until 2 days ago. Denies fevers, chills, nausea, abdominal pain, nor flank pain  Review Of Systems Outlined In HPI  Past Medical History  Diagnosis Date  . 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  . Osteoporosis   . Herpes zoster      Family History  Problem Relation Age of Onset  . Stroke Father   . Atrial fibrillation    . Heart disease Mother     Atrial Fibrillation     History  Substance Use Topics  . Smoking status: Former Smoker    Quit date: 10/19/1959  . Smokeless tobacco: Not on file  . Alcohol Use: 1.0 oz/week    2 drink(s) per week     Objective: Filed Vitals:   09/12/13 1351  BP: 119/78  Pulse: 83  Temp: 97.5 F (36.4 C)    Vital signs reviewed. General: Alert and Oriented, No Acute Distress HEENT: Pupils equal, round, reactive to light. Conjunctivae clear.  External ears unremarkable.  Moist mucous membranes. Lungs: Clear and comfortable work of breathing, speaking in full sentences without accessory muscle use. Cardiac: Regular rate and rhythm.  Neuro: CN II-XII grossly intact, gait normal. Extremities: No peripheral edema.  Strong peripheral pulses.  Back: No CVA tenderness Mental Status: No depression, anxiety, nor agitation. Logical though process. Skin: Warm and dry.  Assessment & Plan: Terri Small was seen today for urinary tract infection.  Diagnoses and associated orders for this  visit:  Urethral pain - Urinalysis Dipstick - Urine Culture - cefTRIAXone (ROCEPHIN) injection 1 g; Inject 1 g into the muscle once.  UTI (urinary tract infection) - cephALEXin (KEFLEX) 500 MG capsule; Take 1 capsule (500 mg total) by mouth 4 (four) times daily. - cefTRIAXone (ROCEPHIN) injection 1 g; Inject 1 g into the muscle once.    Based on past sensitivities I believe her UTI should be responsive to Keflex for 10 days, will provide with ceftriaxone in case she is unable to get her prescription filled until tomorrow or later given the holiday  Return if symptoms worsen or fail to improve.

## 2013-09-17 ENCOUNTER — Other Ambulatory Visit: Payer: Self-pay | Admitting: Family Medicine

## 2013-09-18 ENCOUNTER — Other Ambulatory Visit: Payer: Self-pay | Admitting: Family Medicine

## 2013-09-18 LAB — PROTIME-INR
INR: 2.3 — ABNORMAL HIGH (ref <1.50)
Prothrombin Time: 24.5 seconds — ABNORMAL HIGH (ref 11.6–15.2)

## 2013-09-18 LAB — URINE CULTURE: Organism ID, Bacteria: NO GROWTH

## 2013-09-19 ENCOUNTER — Telehealth: Payer: Self-pay | Admitting: *Deleted

## 2013-09-19 ENCOUNTER — Ambulatory Visit: Payer: Self-pay | Admitting: Family Medicine

## 2013-09-19 DIAGNOSIS — I4891 Unspecified atrial fibrillation: Secondary | ICD-10-CM

## 2013-09-19 NOTE — Telephone Encounter (Signed)
Pt informed.Jw Covin [place]  

## 2013-10-02 ENCOUNTER — Other Ambulatory Visit: Payer: Self-pay | Admitting: Family Medicine

## 2013-10-03 ENCOUNTER — Ambulatory Visit: Payer: Self-pay | Admitting: Family Medicine

## 2013-10-03 ENCOUNTER — Other Ambulatory Visit: Payer: Self-pay | Admitting: Family Medicine

## 2013-10-03 ENCOUNTER — Telehealth: Payer: Self-pay | Admitting: *Deleted

## 2013-10-03 DIAGNOSIS — I4891 Unspecified atrial fibrillation: Secondary | ICD-10-CM

## 2013-10-03 LAB — PROTIME-INR: Prothrombin Time: 27.8 seconds — ABNORMAL HIGH (ref 11.6–15.2)

## 2013-10-03 NOTE — Telephone Encounter (Signed)
Same dose. 4mg  on Monday and Friday. 5 mg all other days. Repeat in 4 weeks.  Pt informed.Loralee Pacas [place]

## 2013-10-04 ENCOUNTER — Ambulatory Visit (INDEPENDENT_AMBULATORY_CARE_PROVIDER_SITE_OTHER): Payer: Medicare Other | Admitting: Family Medicine

## 2013-10-04 ENCOUNTER — Encounter: Payer: Self-pay | Admitting: Family Medicine

## 2013-10-04 ENCOUNTER — Ambulatory Visit: Payer: Medicare Other | Admitting: Sports Medicine

## 2013-10-04 VITALS — BP 109/64 | HR 65 | Temp 97.4°F | Wt 154.0 lb

## 2013-10-04 DIAGNOSIS — N390 Urinary tract infection, site not specified: Secondary | ICD-10-CM

## 2013-10-04 DIAGNOSIS — R319 Hematuria, unspecified: Secondary | ICD-10-CM

## 2013-10-04 LAB — POCT URINALYSIS DIPSTICK
Glucose, UA: NEGATIVE
Protein, UA: NEGATIVE
Spec Grav, UA: >=1.03
Urobilinogen, UA: 0.2

## 2013-10-04 MED ORDER — NITROFURANTOIN MACROCRYSTAL 50 MG PO CAPS: 50.0000 mg | ORAL_CAPSULE | Freq: Every day | ORAL | Status: DC

## 2013-10-04 MED ORDER — CIPROFLOXACIN HCL 500 MG PO TABS: 500.0000 mg | ORAL_TABLET | Freq: Two times a day (BID) | ORAL | Status: AC

## 2013-10-04 NOTE — Progress Notes (Signed)
   Subjective:    Patient ID: Terri Small, female    DOB: 1931-02-28, 79 y.o.   MRN: [id number]  HPI Dysuria for a few days. She's also had some frequency and urgency. No back pain fevers or chills or sweats. Last UTI was about 4 weeks ago. The urine culture came back negative. She was placed on Keflex and says did get better pretty quickly. She is at the point now where she would like to be on something prophylactically. We discussed in the past and she had held off that now wants to start something.   Review of Systems     Objective:   Physical Exam  Constitutional: She is oriented to person, place, and time. She appears well-developed and well-nourished.  HENT:  Head: Normocephalic and atraumatic.  Eyes: Conjunctivae and EOM are normal.  Cardiovascular: Normal rate.   Pulmonary/Chest: Effort normal.  Neurological: She is alert and oriented to person, place, and time.  Skin: Skin is dry. No pallor.  Psychiatric: She has a normal mood and affect. Her behavior is normal.          Assessment & Plan:  Acute UTI-will treat with Cipro times one week. Call if not better in one week. Continue since Medicare drink plenty of fluids.  Recurrent UTIs-discussed prophylaxis. Will start nitrofurantoin 50 mg daily. We'll continue for 6-12 months if at that point she's doing well will try a trial off of it.

## 2013-10-04 NOTE — Addendum Note (Signed)
Addended by: Deno Etienne on: 10/04/2013 11:59 AM   Modules accepted: Orders

## 2013-10-07 LAB — URINE CULTURE: Colony Count: >=100000

## 2013-10-08 ENCOUNTER — Other Ambulatory Visit: Payer: Self-pay | Admitting: Physician Assistant

## 2013-10-08 MED ORDER — NITROFURANTOIN MONOHYD MACRO 100 MG PO CAPS: 100.0000 mg | ORAL_CAPSULE | Freq: Two times a day (BID) | ORAL | Status: DC

## 2013-10-16 ENCOUNTER — Encounter: Payer: Self-pay | Admitting: Family Medicine

## 2013-10-16 ENCOUNTER — Ambulatory Visit (INDEPENDENT_AMBULATORY_CARE_PROVIDER_SITE_OTHER): Payer: Medicare Other | Admitting: Family Medicine

## 2013-10-16 VITALS — BP 95/64 | HR 88 | Temp 97.7°F | Wt 153.0 lb

## 2013-10-16 DIAGNOSIS — I272 Pulmonary hypertension, unspecified: Secondary | ICD-10-CM

## 2013-10-16 DIAGNOSIS — J8410 Pulmonary fibrosis, unspecified: Secondary | ICD-10-CM

## 2013-10-16 DIAGNOSIS — J209 Acute bronchitis, unspecified: Secondary | ICD-10-CM

## 2013-10-16 DIAGNOSIS — I2789 Other specified pulmonary heart diseases: Secondary | ICD-10-CM

## 2013-10-16 DIAGNOSIS — I071 Rheumatic tricuspid insufficiency: Secondary | ICD-10-CM | POA: Insufficient documentation
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
## 2013-11-02 ENCOUNTER — Other Ambulatory Visit: Payer: Self-pay | Admitting: Family Medicine

## 2013-11-03 ENCOUNTER — Other Ambulatory Visit: Payer: Self-pay | Admitting: Family Medicine

## 2013-11-03 LAB — PROTIME-INR
INR: 2.93 — ABNORMAL HIGH (ref <1.50)
PROTHROMBIN TIME: 29.8 s — AB (ref 11.6–15.2)

## 2013-11-04 ENCOUNTER — Ambulatory Visit: Payer: Self-pay | Admitting: Family Medicine

## 2013-11-04 DIAGNOSIS — I4891 Unspecified atrial fibrillation: Secondary | ICD-10-CM

## 2013-11-09 ENCOUNTER — Institutional Professional Consult (permissible substitution): Payer: Medicare Other | Admitting: Critical Care Medicine

## 2013-11-17 DIAGNOSIS — S2220XA Unspecified fracture of sternum, initial encounter for closed fracture: Secondary | ICD-10-CM

## 2013-11-17 DIAGNOSIS — S8263XA Displaced fracture of lateral malleolus of unspecified fibula, initial encounter for closed fracture: Secondary | ICD-10-CM | POA: Insufficient documentation

## 2013-11-17 HISTORY — DX: Displaced fracture of lateral malleolus of unspecified fibula, initial encounter for closed fracture: S82.63XA

## 2013-11-17 HISTORY — DX: Unspecified fracture of sternum, initial encounter for closed fracture: S22.20XA

## 2013-11-21 ENCOUNTER — Telehealth: Payer: Self-pay | Admitting: *Deleted

## 2013-11-21 ENCOUNTER — Other Ambulatory Visit: Payer: Self-pay | Admitting: Family Medicine

## 2013-11-21 NOTE — Telephone Encounter (Signed)
Faxed for records [phone]

## 2013-11-22 ENCOUNTER — Ambulatory Visit (INDEPENDENT_AMBULATORY_CARE_PROVIDER_SITE_OTHER): Payer: Medicare Other | Admitting: Family Medicine

## 2013-11-22 ENCOUNTER — Encounter: Payer: Self-pay | Admitting: Family Medicine

## 2013-11-22 ENCOUNTER — Ambulatory Visit (INDEPENDENT_AMBULATORY_CARE_PROVIDER_SITE_OTHER): Payer: Medicare Other

## 2013-11-22 VITALS — BP 131/81 | HR 107

## 2013-11-22 DIAGNOSIS — R079 Chest pain, unspecified: Secondary | ICD-10-CM

## 2013-11-22 DIAGNOSIS — I2510 Atherosclerotic heart disease of native coronary artery without angina pectoris: Secondary | ICD-10-CM

## 2013-11-22 DIAGNOSIS — S2220XA Unspecified fracture of sternum, initial encounter for closed fracture: Secondary | ICD-10-CM

## 2013-11-22 LAB — PROTIME-INR
INR: 1.61 — ABNORMAL HIGH (ref <1.50)
PROTHROMBIN TIME: 18.8 s — AB (ref 11.6–15.2)

## 2013-11-22 LAB — HEMOGLOBIN: Hemoglobin: 12.2 g/dL (ref 12.0–15.0)
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
## 2013-11-23 ENCOUNTER — Encounter: Payer: Self-pay | Admitting: *Deleted

## 2013-11-24 ENCOUNTER — Other Ambulatory Visit: Payer: Self-pay | Admitting: Family Medicine

## 2013-11-26 ENCOUNTER — Ambulatory Visit (INDEPENDENT_AMBULATORY_CARE_PROVIDER_SITE_OTHER): Payer: Medicare Other | Admitting: Physician Assistant

## 2013-11-26 ENCOUNTER — Ambulatory Visit: Payer: Medicare Other | Admitting: Family Medicine

## 2013-11-26 ENCOUNTER — Encounter: Payer: Self-pay | Admitting: Physician Assistant

## 2013-11-26 DIAGNOSIS — S82839A Other fracture of upper and lower end of unspecified fibula, initial encounter for closed fracture: Secondary | ICD-10-CM

## 2013-11-26 DIAGNOSIS — S2220XA Unspecified fracture of sternum, initial encounter for closed fracture: Secondary | ICD-10-CM

## 2013-11-26 DIAGNOSIS — S82409A Unspecified fracture of shaft of unspecified fibula, initial encounter for closed fracture: Secondary | ICD-10-CM

## 2013-11-26 DIAGNOSIS — I4891 Unspecified atrial fibrillation: Secondary | ICD-10-CM

## 2013-11-26 DIAGNOSIS — R0602 Shortness of breath: Secondary | ICD-10-CM

## 2013-11-26 DIAGNOSIS — S82899A Other fracture of unspecified lower leg, initial encounter for closed fracture: Secondary | ICD-10-CM

## 2013-11-26 DIAGNOSIS — R062 Wheezing: Secondary | ICD-10-CM

## 2013-11-26 HISTORY — DX: Unspecified fracture of sternum, initial encounter for closed fracture: S22.20XA

## 2013-11-26 HISTORY — DX: Unspecified fracture of shaft of unspecified fibula, initial encounter for closed fracture: S82.409A

## 2013-11-26 MED ORDER — HYDROCODONE-ACETAMINOPHEN 5-325 MG PO TABS: 1.0000 | ORAL_TABLET | Freq: Four times a day (QID) | ORAL | Status: DC | PRN

## 2013-11-26 MED ORDER — AMBULATORY NON FORMULARY MEDICATION: Status: DC

## 2013-11-26 NOTE — Addendum Note (Signed)
Addended by: Donella Stade on: 11/26/2013 12:47 PM   Modules accepted: Level of Service
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
## 2013-11-26 NOTE — Patient Instructions (Signed)
Will [facility].  [facility] written for [facility].

## 2013-11-28 ENCOUNTER — Telehealth: Payer: Self-pay | Admitting: *Deleted

## 2013-11-28 NOTE — Telephone Encounter (Signed)
Misty, Increase coumadin to 4mg  nightly, please provide Rx if needed, repeat INR in one week.

## 2013-11-28 NOTE — Telephone Encounter (Signed)
Spoke with [name] form [facility] and informed of response. She also states she will inform pt.  Oscar La, LPN

## 2013-11-28 NOTE — Telephone Encounter (Signed)
[name] w/[facility] called and states pt's PT is 14.2 and INR is 1.2. [name] states pt takes 3 mg nightly of Warfarin. Please advise.  Oscar La, LPN

## 2013-12-01 ENCOUNTER — Other Ambulatory Visit: Payer: Self-pay | Admitting: Family Medicine

## 2013-12-05 ENCOUNTER — Telehealth: Payer: Self-pay | Admitting: *Deleted

## 2013-12-05 NOTE — Telephone Encounter (Signed)
She is calling with pt's PT/INR.  PT is 27 and INR is 2.3. Pt is taking 4mg  nightly. Please advise.  Oscar La, LPN

## 2013-12-05 NOTE — Telephone Encounter (Signed)
Terri Small, INR is now in therapeutic range, continue coumadin 4mg  nightly, repeat INR one week, if still therapeutic can space out to monthly checks.

## 2013-12-05 NOTE — Telephone Encounter (Signed)
[facility] informed and will inform pt.  Oscar La, LPN

## 2013-12-05 NOTE — Telephone Encounter (Signed)
Pt's fmla form faxed and placed in scan folder.Terri Small

## 2013-12-10 ENCOUNTER — Ambulatory Visit: Payer: Federal, State, Local not specified - PPO | Admitting: Family Medicine

## 2013-12-10 ENCOUNTER — Ambulatory Visit (INDEPENDENT_AMBULATORY_CARE_PROVIDER_SITE_OTHER): Payer: Medicare Other | Admitting: Physician Assistant

## 2013-12-10 ENCOUNTER — Telehealth: Payer: Self-pay | Admitting: Critical Care Medicine

## 2013-12-10 ENCOUNTER — Encounter: Payer: Self-pay | Admitting: Physician Assistant

## 2013-12-10 VITALS — BP 128/80 | HR 111 | Wt 161.0 lb

## 2013-12-10 DIAGNOSIS — R0602 Shortness of breath: Secondary | ICD-10-CM

## 2013-12-10 DIAGNOSIS — R079 Chest pain, unspecified: Secondary | ICD-10-CM

## 2013-12-10 DIAGNOSIS — R0781 Pleurodynia: Secondary | ICD-10-CM

## 2013-12-10 MED ORDER — AMBULATORY NON FORMULARY MEDICATION: Status: DC

## 2013-12-10 NOTE — Telephone Encounter (Signed)
I spoke with pt about Tomorrow's about.  She is aware this will need to be rescheduled.  I have offered March 10 in [place]. She will speak with her daughter and will call back.

## 2013-12-10 NOTE — Patient Instructions (Signed)
Pulse ox home oximetry test.
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
## 2013-12-11 ENCOUNTER — Institutional Professional Consult (permissible substitution): Payer: Medicare Other | Admitting: Critical Care Medicine

## 2013-12-12 ENCOUNTER — Telehealth: Payer: Self-pay | Admitting: *Deleted

## 2013-12-12 LAB — CBC AND DIFFERENTIAL
HEMOGLOBIN: 12.8 g/dL (ref 12.0–16.0)
PLATELETS: 249 10*3/uL (ref 150–399)
WBC: 5 10^3/mL

## 2013-12-12 LAB — PROTHROMBIN TIME
AVAILABLE: 19.3
INR: 1.9

## 2013-12-12 NOTE — Telephone Encounter (Signed)
Spoke with pt.  We have scheduled her to see PW on March 10 in [place] at 11:15 am.  Pt to arrive at 11 am and bring meds with her.  She verbalized understanding and voiced no further questions or concerns at this time.

## 2013-12-12 NOTE — Telephone Encounter (Signed)
Spoke w/melissa she was calling about an order for INR. She stated that there was an order for a cbc/d that was sent so she did draw both INR and cbc/d.Terri Small [place]

## 2013-12-13 ENCOUNTER — Ambulatory Visit: Payer: Federal, State, Local not specified - PPO | Admitting: Family Medicine

## 2013-12-14 ENCOUNTER — Telehealth: Payer: Self-pay | Admitting: *Deleted

## 2013-12-14 NOTE — Telephone Encounter (Signed)
PT/INR drawn at home through [facility]. PT was 1.9. Pt states she takes 4 mg every day.  We are increasing dose to 6mg  on Sun & Wed; 4mg  all other days.

## 2013-12-19 ENCOUNTER — Encounter: Payer: Self-pay | Admitting: Family Medicine

## 2013-12-19 ENCOUNTER — Telehealth: Payer: Self-pay | Admitting: *Deleted

## 2013-12-19 NOTE — Telephone Encounter (Signed)
Spoke w/Sheryce she informed that pt does NOT NEED O2 for overnight use.Terri Small [place]

## 2013-12-20 ENCOUNTER — Telehealth: Payer: Self-pay | Admitting: Family Medicine

## 2013-12-20 MED ORDER — AMBULATORY NON FORMULARY MEDICATION: Status: DC

## 2013-12-20 NOTE — Telephone Encounter (Signed)
Pt called. She got a call from our nurse that  she no longer needs oxygen. When she called [facility] to return equipment she was told they would need discharge order/phone number for [facility] [phone].

## 2013-12-20 NOTE — Telephone Encounter (Signed)
Patient advised.

## 2013-12-20 NOTE — Telephone Encounter (Signed)
Thank you for checking on this. Please call and let her know ON oxygen test shows she does not need oxygen overnight.

## 2013-12-20 NOTE — Telephone Encounter (Signed)
Order faxed.Terri Small

## 2013-12-21 DIAGNOSIS — I429 Cardiomyopathy, unspecified: Secondary | ICD-10-CM | POA: Insufficient documentation

## 2013-12-25 ENCOUNTER — Ambulatory Visit (INDEPENDENT_AMBULATORY_CARE_PROVIDER_SITE_OTHER): Payer: Medicare Other | Admitting: Critical Care Medicine

## 2013-12-25 ENCOUNTER — Encounter: Payer: Self-pay | Admitting: Critical Care Medicine

## 2013-12-25 VITALS — BP 116/72 | HR 59 | Temp 98.3°F | Ht 63.0 in | Wt 154.0 lb

## 2013-12-25 DIAGNOSIS — I5032 Chronic diastolic (congestive) heart failure: Secondary | ICD-10-CM | POA: Insufficient documentation

## 2013-12-25 DIAGNOSIS — J8410 Pulmonary fibrosis, unspecified: Secondary | ICD-10-CM

## 2013-12-25 DIAGNOSIS — I2789 Other specified pulmonary heart diseases: Secondary | ICD-10-CM

## 2013-12-25 DIAGNOSIS — I272 Pulmonary hypertension, unspecified: Secondary | ICD-10-CM

## 2013-12-25 DIAGNOSIS — IMO0002 Reserved for concepts with insufficient information to code with codable children: Secondary | ICD-10-CM
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
## 2013-12-25 NOTE — Patient Instructions (Signed)
We will obtain a full pulmonary function study We will obtain your CT scan of the chest that was done in 10/2013 for my review We may recommend to stop the macrodantin, wait until we get the above studies No lung medications needed Pulmonary hypertension is due to your heart condition not your lungs Return as needed I will call with results

## 2013-12-26 NOTE — Assessment & Plan Note (Addendum)
Evidence of pulmonary fibrosis seen by pulmonologist in 2008. No evidence for emphysema. Pulmonary functions were well preserved in the past but need to be repeated. I sincerely doubt the patient's pulmonary hypertension seen on recent echocardiography is related to primary lung disease Seen by pulm in [place] in 2008.   Diagnosed with emphysema years ago. Review of CTs from 2008: emphysema seen , only platelike atelectasis/ fibrosis.\ Recent CT 10/2013: awaiting f/u>> PFTs 2008: DLCO 82%  TLC 99%  FeV1 120% Spirometry 07/03/2013 shows an FVC of 97%, FEV1 of 102%, ratio 76%. No significant response to albuterol. Normal spirometry.  Plan Repeat pulmonary function studies We'll need to review recent CT scan the chest personally May need to consider discontinuance of Macrodantin in the setting of pulmonary fibrosis No other workup of pulmonary fibrosis indicated other than the above studies

## 2013-12-26 NOTE — Assessment & Plan Note (Addendum)
Secondary pulmonary hypertension on the basis of diastolic heart failure.  There is evidence for platelike atelectasis and associated minimal interstitial fibrosis however pulmonary function studies previously that had been obtained did not demonstrate significant reduction in lung capacity or diffusion capacity. Plan No indication for a right heart catheterization Will obtain outside records including recent CT scan of chest for personal review We'll obtain full set of pulmonary function studies

## 2013-12-28 LAB — PULMONARY FUNCTION TEST

## 2014-01-01 ENCOUNTER — Telehealth: Payer: Medicare Other | Admitting: Family Medicine

## 2014-01-01 ENCOUNTER — Telehealth: Payer: Self-pay | Admitting: Critical Care Medicine

## 2014-01-01 ENCOUNTER — Encounter: Payer: Self-pay | Admitting: *Deleted

## 2014-01-01 ENCOUNTER — Other Ambulatory Visit: Payer: Self-pay | Admitting: Family Medicine

## 2014-01-01 DIAGNOSIS — J8410 Pulmonary fibrosis, unspecified: Secondary | ICD-10-CM

## 2014-01-01 NOTE — Telephone Encounter (Addendum)
Call patient: I did get a note from Dr. Joya Gaskins, the pulmonologist. He recommended stopping the Macrodantin. Long-term this can cause increased risk of fibrosis which is scarring of the lungs. He doesn't want to have you on anything that can make things worse. I would like to stop it and just see how you did as far as urinary tract infections are concerned. He also feels that the pulmonary hypertension is related to the heart and not the lungs. Thus I would like to get you scheduled with a cardiologist. If this is okay then please let me know. Also, if she's okay with stopping the Macrodantin and let's remove it from the medication list.

## 2014-01-01 NOTE — Telephone Encounter (Signed)
I spoke to the patient. She has scarring in both Lower lobes PFTs preserved. However, I would recommend D/C of macrodantin due to long term higher risk of fibrosis progression.

## 2014-01-01 NOTE — Telephone Encounter (Signed)
Pt informed of recommendations. She is currently under the care of Dr. Mauricio Po (Cardiology) he has increased her metoprolol 50 mg. She is ok with d/cing the macrodantin.Audelia Hives [place]

## 2014-01-03 ENCOUNTER — Encounter: Payer: Self-pay | Admitting: Family Medicine

## 2014-01-07 ENCOUNTER — Encounter: Payer: Self-pay | Admitting: Family Medicine

## 2014-01-10 ENCOUNTER — Other Ambulatory Visit: Payer: Self-pay | Admitting: Family Medicine

## 2014-01-10 LAB — PROTIME-INR
INR: 1.65 — AB (ref <1.50)
PROTHROMBIN TIME: 19.2 s — AB (ref 11.6–15.2)

## 2014-01-11 ENCOUNTER — Other Ambulatory Visit: Payer: Self-pay | Admitting: Family Medicine

## 2014-01-11 ENCOUNTER — Ambulatory Visit: Payer: Self-pay | Admitting: Family Medicine

## 2014-01-11 DIAGNOSIS — I4891 Unspecified atrial fibrillation: Secondary | ICD-10-CM

## 2014-01-11 NOTE — Progress Notes (Signed)
   Subjective:    Patient ID: Terri Small, female    DOB: [birth year], 80 y.o.   MRN: [id number]  HPI    Review of Systems     Objective:   Physical Exam        Assessment & Plan:  Pt called to get results- results given and instructed to increase dose to 5mg  every day and recheck in 2 weeks.  Pt verbalized understanding. Clemetine Marker, LPN

## 2014-01-24 ENCOUNTER — Other Ambulatory Visit: Payer: Self-pay | Admitting: Family Medicine

## 2014-01-24 ENCOUNTER — Ambulatory Visit: Payer: Self-pay | Admitting: Family Medicine

## 2014-01-24 DIAGNOSIS — I4891 Unspecified atrial fibrillation: Secondary | ICD-10-CM

## 2014-01-24 LAB — PROTIME-INR
INR: 2.3 — AB (ref <1.50)
PROTHROMBIN TIME: 24.7 s — AB (ref 11.6–15.2)

## 2014-01-25 ENCOUNTER — Telehealth: Payer: Self-pay | Admitting: *Deleted

## 2014-01-25 NOTE — Telephone Encounter (Signed)
Same dose. 5 mg all days. Repeat in 3 weeks.

## 2014-01-29 ENCOUNTER — Other Ambulatory Visit: Payer: Self-pay | Admitting: Family Medicine

## 2014-01-31 ENCOUNTER — Other Ambulatory Visit: Payer: Self-pay | Admitting: Family Medicine

## 2014-02-01 DIAGNOSIS — R609 Edema, unspecified: Secondary | ICD-10-CM | POA: Insufficient documentation

## 2014-02-01 DIAGNOSIS — R0602 Shortness of breath: Secondary | ICD-10-CM | POA: Insufficient documentation

## 2014-02-03 ENCOUNTER — Encounter: Payer: Self-pay | Admitting: Emergency Medicine

## 2014-02-03 ENCOUNTER — Emergency Department (INDEPENDENT_AMBULATORY_CARE_PROVIDER_SITE_OTHER)
Admission: EM | Admit: 2014-02-03 | Discharge: 2014-02-03 | Disposition: A | Discharge status: Home / Self Care | Payer: Medicare Other | Source: Home / Self Care

## 2014-02-03 DIAGNOSIS — N3000 Acute cystitis without hematuria: Secondary | ICD-10-CM

## 2014-02-03 LAB — POCT URINALYSIS DIP (MANUAL ENTRY)
BILIRUBIN UA: NEGATIVE
GLUCOSE UA: NEGATIVE
Nitrite, UA: POSITIVE
PH UA: 5
RBC UA: NEGATIVE
SPEC GRAV UA: 1.02
Urobilinogen, UA: 0.2

## 2014-02-03 MED ORDER — CEPHALEXIN 500 MG PO CAPS: 500.0000 mg | ORAL_CAPSULE | Freq: Two times a day (BID) | ORAL | Status: DC

## 2014-02-03 NOTE — Discharge Instructions (Signed)
Continue increased fluid intake.  May continue cranberry pills.   Urinary Tract Infection Urinary tract infections (UTIs) can develop anywhere along your urinary tract. Your urinary tract is your body's drainage system for removing wastes and extra water. Your urinary tract includes two kidneys, two ureters, a bladder, and a urethra. Your kidneys are a pair of bean-shaped organs. Each kidney is about the size of your fist. They are located below your ribs, one on each side of your spine. CAUSES Infections are caused by microbes, which are microscopic organisms, including fungi, viruses, and bacteria. These organisms are so small that they can only be seen through a microscope. Bacteria are the microbes that most commonly cause UTIs. SYMPTOMS  Symptoms of UTIs may vary by age and gender of the patient and by the location of the infection. Symptoms in young women typically include a frequent and intense urge to urinate and a painful, burning feeling in the bladder or urethra during urination. Older women and men are more likely to be tired, shaky, and weak and have muscle aches and abdominal pain. A fever may mean the infection is in your kidneys. Other symptoms of a kidney infection include pain in your back or sides below the ribs, nausea, and vomiting. DIAGNOSIS To diagnose a UTI, your caregiver will ask you about your symptoms. Your caregiver also will ask to provide a urine sample. The urine sample will be tested for bacteria and white blood cells. White blood cells are made by your body to help fight infection. TREATMENT  Typically, UTIs can be treated with medication. Because most UTIs are caused by a bacterial infection, they usually can be treated with the use of antibiotics. The choice of antibiotic and length of treatment depend on your symptoms and the type of bacteria causing your infection. HOME CARE INSTRUCTIONS  If you were prescribed antibiotics, take them exactly as your caregiver  instructs you. Finish the medication even if you feel better after you have only taken some of the medication.  Drink enough water and fluids to keep your urine clear or pale yellow.  Avoid caffeine, tea, and carbonated beverages. They tend to irritate your bladder.  Empty your bladder often. Avoid holding urine for long periods of time.  Empty your bladder before and after sexual intercourse.  After a bowel movement, women should cleanse from front to back. Use each tissue only once. SEEK MEDICAL CARE IF:   You have back pain.  You develop a fever.  Your symptoms do not begin to resolve within 3 days. SEEK IMMEDIATE MEDICAL CARE IF:   You have severe back pain or lower abdominal pain.  You develop chills.  You have nausea or vomiting.  You have continued burning or discomfort with urination. MAKE SURE YOU:   Understand these instructions.  Will watch your condition.  Will get help right away if you are not doing well or get worse. Document Released: 07/14/2005 Document Revised: 04/04/2012 Document Reviewed: 11/12/2011 [facility] Patient Information 2014 [place].

## 2014-02-03 NOTE — ED Provider Notes (Signed)
CSN: [id number]     Arrival date & time 02/03/14  1152 History   First MD Initiated Contact with Patient 02/03/14 1314     Chief Complaint  Patient presents with  . Dysuria      HPI Comments: Patient complains of onset of mild dysuria about 10 days ago that has persisted.  She has had frequency and hesitancy.  No fevers, chills, and sweats.  She feels well otherwise.  Her last UTI was December 2014.  She states that she has responded well in the past to Cipro.  She states that she had been placed on antibiotic prophylaxis with nitrofurantoin that was recently discontinued.  Patient is a 80 y.o. female presenting with dysuria. The history is provided by the patient.  Dysuria Pain quality:  Burning Pain severity:  Mild Onset quality:  Gradual Duration:  10 days Timing:  Constant Progression:  Unchanged Chronicity:  Recurrent Recent urinary tract infections: yes   Relieved by:  Nothing Worsened by:  Nothing tried Ineffective treatments:  Cranberry juice Urinary symptoms: frequent urination and hesitancy   Urinary symptoms: no discolored urine, no foul-smelling urine, no hematuria and no bladder incontinence   Associated symptoms: no abdominal pain, no fever, no flank pain, no genital lesions, no nausea, no vaginal discharge and no vomiting   Risk factors: recurrent urinary tract infections     Past Medical History  Diagnosis Date  . 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  . Osteoporosis   . Herpes zoster    Past Surgical History  Procedure Laterality Date  . Abdominal hysterectomy      Vaginal  . Cholecystectomy      Laparoscopic  . Cardiac catheterization      W/ Ablation   Family History  Problem Relation Age of Onset  . Stroke Father   . Atrial fibrillation    . Emphysema Father    History  Substance Use Topics  . Smoking status: Former Smoker -- 1.00 packs/day for 6 years    Types: Cigarettes    Quit date: 10/18/1960  . Smokeless tobacco: Never Used  . Alcohol Use: 1.0 oz/week    2 drink(s) per week     Comment: 2 oz wine 2-3 times weekly   OB History   Grav Para Term Preterm Abortions TAB SAB Ect Mult Living                 Review of Systems  Constitutional: Negative for fever.  Gastrointestinal: Negative for nausea, vomiting and abdominal pain.  Genitourinary: Positive for dysuria. Negative for flank pain and vaginal discharge.  All other systems reviewed and are negative.   Allergies  Nexium and Sulfonamide derivatives  Home Medications   Prior to Admission medications   Medication Sig Start Date End Date Taking? Authorizing Provider  alendronate (FOSAMAX) 70 MG tablet TAKE 1 TAB BY MOUTH EVERY 7 HOURS. TAKE WITH A FULL GLASS OF WATER ON AN EMPTY STOMACH. 11/24/13   Hali Marry, MD  alendronate (FOSAMAX) 70 MG tablet TAKE 1 TAB BY MOUTH EVERY 7 HOURS. TAKE WITH A FULL GLASS OF WATER ON AN EMPTY STOMACH. 01/29/14   Hali Marry, MD  Cholecalciferol (VITAMIN D) 2000 UNITS CAPS Take 1 capsule by mouth daily.    Historical Provider, MD  cycloSPORINE (RESTASIS) 0.05 % ophthalmic emulsion 1 drop 2 (two) times daily.    Historical Provider, MD  diltiazem (CARDIZEM CD) 240  MG 24 hr capsule TAKE ONE CAPSULE BY MOUTH EVERY DAY 01/31/14   Hali Marry, MD  glucosamine-chondroitin 500-400 MG tablet Take 1 tablet by mouth 2 (two) times daily.    Historical Provider, MD  metoprolol succinate (TOPROL-XL) 50 MG 24 hr tablet Take 50 mg by mouth daily.    Historical Provider, MD  Multiple Vitamins-Minerals (PRESERVISION AREDS 2 PO) Take 1 tablet by mouth 2 (two) times daily.    Historical Provider, MD  warfarin (COUMADIN) 4 MG tablet Take 1 tablet (4 mg total) by mouth daily as needed. 11/22/13   Marcial Pacas, DO  warfarin (COUMADIN) 6 MG tablet Take 6 mg by mouth as directed.    Historical Provider, MD   BP 113/74  Pulse 84  Temp(Src) 97.6 F (36.4 C) (Oral)  Ht  5\' 2"  (1.575 m)  Wt 156 lb (70.761 kg)  BMI 28.53 kg/m2  SpO2 95% Physical Exam Nursing notes and Vital Signs reviewed. Appearance:  Patient appears healthy, stated age, and in no acute distress Eyes:  Pupils are equal, round, and reactive to light and accomodation.  Extraocular movement is intact.  Conjunctivae are not inflamed  Pharynx:  Normal; moist mucous membranes  Neck:  Supple.   No adenopathy Lungs:  Clear to auscultation.  Breath sounds are equal.  Heart:  Regular rate and rhythm without murmurs, rubs, or gallops.  Abdomen:  Nontender without masses or hepatosplenomegaly.  Bowel sounds are present.  No CVA or flank tenderness.  Extremities:  No edema.  No calf tenderness Skin:  No rash present.    ED Course  Procedures  none Labs Review   Results for orders placed during the hospital encounter of 02/03/14  POCT URINALYSIS DIP (MANUAL ENTRY)      Result Value Ref Range   Color, UA yellow     Clarity, UA cloudy     Glucose, UA neg     Bilirubin, UA negative     Bilirubin, UA trace (5)     Spec Grav, UA 1.020     Blood, UA negative     pH, UA 5.0     Protein Ur, POC trace     Urobilinogen, UA 0.2     Nitrite, UA Positive     Leukocytes, UA small (1+)          MDM   1. Acute cystitis    Review of records:  Urine culture done 10/04/13 showed e. Coli resistant to ciprofloxacin and levofloxacin. Urine culture pending.  Begin Keflex (will begin empiric therapy based on urine culture done 10/04/13, showing e. Coli sensitive to cephalosporins) Continue increased fluid intake.  May continue cranberry pills. Followup with Family Doctor in 10 days; consider repeat urine culture. If symptoms become significantly worse during the night or over the weekend, proceed to the local emergency room.     Kandra Nicolas, MD 02/03/14 1345

## 2014-02-03 NOTE — ED Notes (Signed)
Pt has had UTi sx for 10 days.  She has tried drinking extra water and cranberry juice to flush it out on her own pt states.  Today it got worse.  She experiencing pain all the time and has had decreased frequency in urination.  Has a history of UTIs and states Cipro usually works best for her.

## 2014-02-06 ENCOUNTER — Telehealth: Payer: Self-pay | Admitting: Emergency Medicine

## 2014-02-06 LAB — URINE CULTURE

## 2014-02-11 ENCOUNTER — Ambulatory Visit (INDEPENDENT_AMBULATORY_CARE_PROVIDER_SITE_OTHER): Payer: Medicare Other | Admitting: Family Medicine

## 2014-02-11 ENCOUNTER — Telehealth: Payer: Self-pay | Admitting: Family Medicine

## 2014-02-11 ENCOUNTER — Encounter: Payer: Self-pay | Admitting: Family Medicine

## 2014-02-11 VITALS — BP 125/83 | HR 115 | Wt 158.0 lb

## 2014-02-11 DIAGNOSIS — I4891 Unspecified atrial fibrillation: Secondary | ICD-10-CM

## 2014-02-11 DIAGNOSIS — I5030 Unspecified diastolic (congestive) heart failure: Secondary | ICD-10-CM

## 2014-02-11 DIAGNOSIS — N390 Urinary tract infection, site not specified: Secondary | ICD-10-CM

## 2014-02-11 DIAGNOSIS — I509 Heart failure, unspecified: Secondary | ICD-10-CM

## 2014-02-11 MED ORDER — TRIMETHOPRIM 100 MG PO TABS: 100.0000 mg | ORAL_TABLET | Freq: Every day | ORAL | Status: DC

## 2014-02-11 NOTE — Telephone Encounter (Signed)
lvm with recommendations.Terri Small

## 2014-02-11 NOTE — Progress Notes (Addendum)
   Subjective:    Patient ID: Terri Small, female    DOB: 1931-05-17, 80 y.o.   MRN: [id number]  HPI Has been on Keflex for urinary tract infection. Dr. Assunta Found wanted her to have a repeat urine.  Symptomatically, she is feeling much better. No dysuria or hematuria. No fevers chills or sweats.  Atrial fib - she's getting her INR checked tomorrow. No lightheaded or dizzy. Still some SOB.  F/U in July for an echo. She's no longer walking for exercise.  CHF- on lasix for swelling in her leg.  Due for labwork for potassium tomorrow.    Has a few lesions on her face that she wants me to look at it.   Stopped nitrofurantoin with pulmonary fibrosis.    Review of Systems     Objective:   Physical Exam  Constitutional: She is oriented to person, place, and time. She appears well-developed and well-nourished.  HENT:  Head: Normocephalic and atraumatic.  Neck: Neck supple. No thyromegaly present.  Cardiovascular: Normal rate, regular rhythm and normal heart sounds.   No carotid bruits.   Pulmonary/Chest: Effort normal and breath sounds normal.  Lymphadenopathy:    She has no cervical adenopathy.  Neurological: She is alert and oriented to person, place, and time.  Skin: Skin is warm and dry.  Psychiatric: She has a normal mood and affect. Her behavior is normal.          Assessment & Plan:  UTI e coli - will cutlure later this week.  She will complete the antibiotic tomorrow.  Will need ot start something for prophylaxis.   Atrial fibrillation-overall fairly well controlled but she still expresses some shortness of breath which keeps her from exercising.  Congestive heart failure-well-controlled on current regimen. Has followup with cardiology in July for repeat echocardiogram.  Recurrent UTI-will need to start something for prophylaxis. Her pulmonologist wanted her off the nitrofurantoin with because of increase risk of pulmonary fibrosis. And she has a sulfa allergy. We can try  trimethoprim 100 mg daily. We sent  to pharmacy.  Actinic keratoses/seborrheic her ptosis-cryotherapy performed today and patient tolerated well.

## 2014-02-11 NOTE — Telephone Encounter (Signed)
Please call patient and let her medicine over prophylactic antibiotic trimethoprim 100 mg daily for prophylaxis for urinary tract infections. She can start the tab on Wednesday.

## 2014-02-12 ENCOUNTER — Other Ambulatory Visit: Payer: Self-pay | Admitting: Family Medicine

## 2014-02-12 ENCOUNTER — Ambulatory Visit: Payer: Self-pay | Admitting: Family Medicine

## 2014-02-12 DIAGNOSIS — I4891 Unspecified atrial fibrillation: Secondary | ICD-10-CM

## 2014-02-12 LAB — LIPID PANEL
Cholesterol: 170 mg/dL (ref 0–200)
HDL: 64 mg/dL (ref >39)
LDL CALC: 90 mg/dL (ref 0–99)
TRIGLYCERIDES: 81 mg/dL (ref <150)
Total CHOL/HDL Ratio: 2.7 Ratio
VLDL: 16 mg/dL (ref 0–40)

## 2014-02-12 LAB — PROTIME-INR
INR: 1.65 — ABNORMAL HIGH (ref <1.50)
PROTHROMBIN TIME: 19.2 s — AB (ref 11.6–15.2)

## 2014-02-12 NOTE — Progress Notes (Signed)
Quick Note:  All labs are normal. ______ 

## 2014-02-13 ENCOUNTER — Ambulatory Visit: Payer: Medicare Other

## 2014-02-13 NOTE — Progress Notes (Signed)
   Subjective:    Patient ID: Terri Small, female    DOB: 02-Feb-1931, 80 y.o.   MRN: [id number]  HPI    Review of Systems     Objective:   Physical Exam        Assessment & Plan:  Pt notified of lab results and INR.  Instructed on new Coumadin instructions.  Pt verbalized understanding. Clemetine Marker, LPN

## 2014-02-18 ENCOUNTER — Other Ambulatory Visit: Payer: Self-pay | Admitting: Family Medicine

## 2014-02-18 LAB — URINE CULTURE: Colony Count: >=100000

## 2014-02-18 MED ORDER — NITROFURANTOIN MACROCRYSTAL 100 MG PO CAPS: 100.0000 mg | ORAL_CAPSULE | Freq: Two times a day (BID) | ORAL | Status: DC

## 2014-02-18 NOTE — Addendum Note (Signed)
Addended by: Beatrice Lecher D on: 02/18/2014 07:58 AM   Modules accepted: Orders

## 2014-02-20 ENCOUNTER — Ambulatory Visit: Payer: Medicare Other

## 2014-02-21 ENCOUNTER — Ambulatory Visit (INDEPENDENT_AMBULATORY_CARE_PROVIDER_SITE_OTHER): Payer: Medicare Other | Admitting: Family Medicine

## 2014-02-21 ENCOUNTER — Other Ambulatory Visit: Payer: Self-pay | Admitting: *Deleted

## 2014-02-21 ENCOUNTER — Encounter: Payer: Self-pay | Admitting: *Deleted

## 2014-02-21 VITALS — BP 114/79 | HR 79

## 2014-02-21 DIAGNOSIS — I4891 Unspecified atrial fibrillation: Secondary | ICD-10-CM

## 2014-02-21 LAB — POCT INR: INR: 2

## 2014-02-21 MED ORDER — WARFARIN SODIUM 5 MG PO TABS: 5.0000 mg | ORAL_TABLET | Freq: Every day | ORAL | Status: DC

## 2014-02-21 MED ORDER — WARFARIN SODIUM 1 MG PO TABS: 1.0000 mg | ORAL_TABLET | Freq: Every day | ORAL | Status: DC

## 2014-02-21 NOTE — Progress Notes (Signed)
Pt notified of dosing instructions.  Sent over rx for 5mg  & 1mg  to pharm.

## 2014-02-23 ENCOUNTER — Other Ambulatory Visit: Payer: Self-pay | Admitting: Family Medicine

## 2014-03-06 ENCOUNTER — Other Ambulatory Visit: Payer: Self-pay | Admitting: Family Medicine

## 2014-03-12 ENCOUNTER — Other Ambulatory Visit: Payer: Self-pay | Admitting: Family Medicine

## 2014-03-13 ENCOUNTER — Ambulatory Visit: Payer: Self-pay | Admitting: Family Medicine

## 2014-03-13 ENCOUNTER — Other Ambulatory Visit: Payer: Self-pay | Admitting: *Deleted

## 2014-03-13 ENCOUNTER — Telehealth: Payer: Self-pay | Admitting: *Deleted

## 2014-03-13 DIAGNOSIS — I4891 Unspecified atrial fibrillation: Secondary | ICD-10-CM

## 2014-03-13 LAB — PROTIME-INR
INR: 3.88 — ABNORMAL HIGH (ref <1.50)
PROTHROMBIN TIME: 36.9 s — AB (ref 11.6–15.2)

## 2014-03-13 NOTE — Telephone Encounter (Signed)
Decrease dose. 6 mg on Monday, Thursday. 5 mg all other days. Repeat in 2 weeks.

## 2014-03-14 ENCOUNTER — Ambulatory Visit: Payer: Medicare Other

## 2014-03-29 ENCOUNTER — Telehealth: Payer: Self-pay | Admitting: *Deleted

## 2014-03-29 NOTE — Telephone Encounter (Signed)
OK, skip todays dose.  Or the hospital may adjust it

## 2014-03-29 NOTE — Telephone Encounter (Signed)
Pt called to give INR results it was 3.3. After speaking with her daughter she informed me that she had to take her mom to the ED for SOB and when they checked her INR it was 3.8 will forward to dr. Madilyn Fireman for f/u.Terri Small

## 2014-04-01 NOTE — Telephone Encounter (Signed)
LM on daughter's VM.  Oscar La, LPN

## 2014-04-04 ENCOUNTER — Telehealth: Payer: Self-pay | Admitting: *Deleted

## 2014-04-04 NOTE — Telephone Encounter (Signed)
Pt had her INR checked and it was 2.4 and was told to continue on her current regimen of 5 mg QD she wanted to relay this information to Dr. Madilyn Fireman .Audelia Hives [place]

## 2014-04-05 ENCOUNTER — Ambulatory Visit: Payer: Self-pay | Admitting: Family Medicine

## 2014-04-05 DIAGNOSIS — I4891 Unspecified atrial fibrillation: Secondary | ICD-10-CM

## 2014-04-05 LAB — PROTIME-INR: INR: 2.4 — AB (ref 0.9–1.1)

## 2014-04-05 NOTE — Telephone Encounter (Signed)
Will note in Coumadin flow sheet so we can track along.

## 2014-04-22 ENCOUNTER — Telehealth: Payer: Self-pay | Admitting: *Deleted

## 2014-04-22 NOTE — Telephone Encounter (Signed)
Pt wanted to verify keflex not cipro information given to pt.Terri Small [place]

## 2014-04-22 NOTE — Telephone Encounter (Signed)
Pt called to verify the dosage of cipro that she was on last time it was given.Terri Small

## 2014-04-27 ENCOUNTER — Other Ambulatory Visit: Payer: Self-pay | Admitting: Family Medicine

## 2014-05-06 ENCOUNTER — Other Ambulatory Visit: Payer: Self-pay | Admitting: Family Medicine

## 2014-05-07 ENCOUNTER — Ambulatory Visit: Payer: Medicare Other

## 2014-05-09 ENCOUNTER — Ambulatory Visit: Payer: Medicare Other

## 2014-05-10 ENCOUNTER — Other Ambulatory Visit: Payer: Self-pay | Admitting: Family Medicine

## 2014-05-11 LAB — PROTIME-INR
INR: 3.54 — ABNORMAL HIGH (ref <1.50)
Prothrombin Time: 35.4 seconds — ABNORMAL HIGH (ref 11.6–15.2)

## 2014-05-13 ENCOUNTER — Ambulatory Visit: Payer: Self-pay | Admitting: Family Medicine

## 2014-05-13 DIAGNOSIS — I4891 Unspecified atrial fibrillation: Secondary | ICD-10-CM

## 2014-05-28 ENCOUNTER — Encounter: Payer: Self-pay | Admitting: Family Medicine

## 2014-05-28 ENCOUNTER — Encounter: Payer: Self-pay | Admitting: Physician Assistant

## 2014-05-28 ENCOUNTER — Other Ambulatory Visit: Payer: Self-pay | Admitting: Family Medicine

## 2014-05-28 ENCOUNTER — Ambulatory Visit (INDEPENDENT_AMBULATORY_CARE_PROVIDER_SITE_OTHER): Payer: Medicare Other | Admitting: Physician Assistant

## 2014-05-28 VITALS — BP 114/76 | HR 81 | Ht 62.0 in | Wt 158.0 lb

## 2014-05-28 DIAGNOSIS — R300 Dysuria: Secondary | ICD-10-CM

## 2014-05-28 DIAGNOSIS — R3989 Other symptoms and signs involving the genitourinary system: Secondary | ICD-10-CM

## 2014-05-28 DIAGNOSIS — N3289 Other specified disorders of bladder: Secondary | ICD-10-CM

## 2014-05-28 DIAGNOSIS — Z87440 Personal history of urinary (tract) infections: Secondary | ICD-10-CM

## 2014-05-28 NOTE — Addendum Note (Signed)
Addended by: Beatris Ship L on: 05/28/2014 03:10 PM   Modules accepted: Orders

## 2014-05-28 NOTE — Addendum Note (Signed)
Addended by: Beatris Ship L on: 05/28/2014 02:19 PM   Modules accepted: Orders

## 2014-05-28 NOTE — Progress Notes (Addendum)
   Subjective:    Patient ID: Terri Small, female    DOB: Jan 02, 1931, 80 y.o.   MRN: [id number]  HPI Pt is an 80 yo female who presents to the clinic with sensation of pressure in bladder. She has had frequent UTI's within the last year and she is on Macrodantin and trimpex nightly to prevent UTI's. She just got back from vacation where she had to go to UC and get keflex. Denies any increase in urinary frequency. She does take lasix and makes her go to the bathroom frequently already. No fever, chills, n/v/d, or abdominal pain. Urine smelled and was cloudy yesterday and was some discomfort with urination but this morning improved. She feels like sometimes there is something blocking her bladder and feels a lot pressure. She has urologist appt September 2nd.   Review of Systems  All other systems reviewed and are negative.      Objective:   Physical Exam  Constitutional: She is oriented to person, place, and time. She appears well-developed and well-nourished.  HENT:  Head: Normocephalic and atraumatic.  Cardiovascular: Normal rate, regular rhythm and normal heart sounds.   Pulmonary/Chest: Effort normal and breath sounds normal.  No CVA tenderness.   Abdominal: Soft. Bowel sounds are normal. She exhibits no distension and no mass. There is no tenderness. There is no rebound and no guarding.  Neurological: She is alert and oriented to person, place, and time.  Skin: Skin is dry.  Psychiatric: She has a normal mood and affect. Her behavior is normal.          Assessment & Plan:  Sensation of pressure in bladder/dysuria/hx of recurrent UTI's- UA negative blood, nitrates, and leukocytes. Will culture. Discussed results with patient. Offered to visual look at urethra and make sure no obstruction and externally everything checked out. Pt declined due to urologist appt made for September 2nd. Encouraged continue to stay hydrated and follow up as needed. Continue preventative abx.

## 2014-05-29 ENCOUNTER — Ambulatory Visit: Payer: Self-pay | Admitting: Family Medicine

## 2014-05-29 DIAGNOSIS — I4891 Unspecified atrial fibrillation: Secondary | ICD-10-CM

## 2014-05-29 LAB — PROTIME-INR
INR: 2.57 — ABNORMAL HIGH (ref <1.50)
PROTHROMBIN TIME: 27.6 s — AB (ref 11.6–15.2)

## 2014-05-30 NOTE — Progress Notes (Signed)
   Subjective:    Patient ID: Terri Small, female    DOB: 11/22/1930, 80 y.o.   MRN: [id number] Pt notified to stay on same dose of coumadin and to recheck in 3 weeks. Beatris Ship, CMA HPI    Review of Systems     Objective:   Physical Exam        Assessment & Plan:

## 2014-05-31 ENCOUNTER — Other Ambulatory Visit: Payer: Self-pay | Admitting: Physician Assistant

## 2014-05-31 LAB — URINE CULTURE: Colony Count: >=100000

## 2014-05-31 MED ORDER — NITROFURANTOIN MONOHYD MACRO 100 MG PO CAPS: 100.0000 mg | ORAL_CAPSULE | Freq: Two times a day (BID) | ORAL | Status: DC

## 2014-06-04 ENCOUNTER — Ambulatory Visit: Payer: Medicare Other | Admitting: Family Medicine

## 2014-06-06 ENCOUNTER — Encounter: Payer: Self-pay | Admitting: Family Medicine

## 2014-06-17 ENCOUNTER — Ambulatory Visit: Payer: Self-pay | Admitting: Family Medicine

## 2014-06-17 ENCOUNTER — Encounter: Payer: Self-pay | Admitting: Family Medicine

## 2014-06-17 ENCOUNTER — Ambulatory Visit (INDEPENDENT_AMBULATORY_CARE_PROVIDER_SITE_OTHER): Payer: Medicare Other | Admitting: Family Medicine

## 2014-06-17 VITALS — BP 109/67 | HR 89 | Wt 158.0 lb

## 2014-06-17 DIAGNOSIS — L570 Actinic keratosis: Secondary | ICD-10-CM

## 2014-06-17 DIAGNOSIS — L821 Other seborrheic keratosis: Secondary | ICD-10-CM

## 2014-06-17 DIAGNOSIS — R300 Dysuria: Secondary | ICD-10-CM

## 2014-06-17 DIAGNOSIS — I4891 Unspecified atrial fibrillation: Secondary | ICD-10-CM

## 2014-06-17 LAB — POCT INR: INR: 2.3

## 2014-06-17 NOTE — Progress Notes (Signed)
   Subjective:    Patient ID: Terri Small, female    DOB: 09-01-1931, 80 y.o.   MRN: [id number]  HPI Skin lesion on left jaw.  Daughter noticed it a few months ago an encouraged her to come in. It really has not bothered her in any way. Not itchy or tender or bleeding or sore. She also has a crusty lesion on her left temple that she would like me to look at today.  Dysuria again. Unfortunately she was treated for a UTI about 2 weeks ago and then at the end of July. She does have an appointment on Wednesday with [facility] urology. She's been using Tylenol and taking lots of fluids. She doesn't want to treat this particular UTI until she is able to do a culture etc.  Review of Systems     Objective:   Physical Exam  Constitutional: She is oriented to person, place, and time. She appears well-developed and well-nourished.  HENT:  Head: Normocephalic and atraumatic.  Cardiovascular: Normal rate, regular rhythm and normal heart sounds.   Pulmonary/Chest: Effort normal and breath sounds normal.  Neurological: She is alert and oriented to person, place, and time.  Skin: Skin is warm and dry.  approx 1 cm erythematous crusting lesion on the left templ. On her left jaw line she has an approx 1 cm pink seb keratosis.   Psychiatric: She has a normal mood and affect. Her behavior is normal.          Assessment & Plan:  Seborrheic keratosis-cryotherapy performed today. Patient tolerated well.  Actinic keratosis on left temple-cryotherapy performed. Patient tolerated well.  Dysuria-likely recurrent UTI. She has appointment with [facility] urology on Wednesday and opts not to treat at this time until she sees them.  Atrial fibrillation-due for Coumadin check today.  See flowsheet ,

## 2014-06-17 NOTE — Patient Instructions (Signed)
Due for INR check in 1 month.

## 2014-06-18 LAB — COMPLETE METABOLIC PANEL WITH GFR
ALK PHOS: 66 U/L (ref 39–117)
ALT: 22 U/L (ref 0–35)
AST: 26 U/L (ref 0–37)
Albumin: 3.9 g/dL (ref 3.5–5.2)
BILIRUBIN TOTAL: 0.5 mg/dL (ref 0.2–1.2)
BUN: 23 mg/dL (ref 6–23)
CO2: 25 mEq/L (ref 19–32)
Calcium: 8.9 mg/dL (ref 8.4–10.5)
Chloride: 105 mEq/L (ref 96–112)
Creat: 1.06 mg/dL (ref 0.50–1.10)
GFR, Est African American: 57 mL/min — ABNORMAL LOW
GFR, Est Non African American: 49 mL/min — ABNORMAL LOW
Glucose, Bld: 81 mg/dL (ref 70–99)
Potassium: 4.2 mEq/L (ref 3.5–5.3)
SODIUM: 137 meq/L (ref 135–145)
TOTAL PROTEIN: 6.4 g/dL (ref 6.0–8.3)

## 2014-06-18 NOTE — Progress Notes (Signed)
Quick Note:  All labs are normal. ______ 

## 2014-06-25 ENCOUNTER — Encounter: Payer: Self-pay | Admitting: Family Medicine

## 2014-07-10 ENCOUNTER — Other Ambulatory Visit: Payer: Self-pay | Admitting: Family Medicine

## 2014-07-10 LAB — PROTIME-INR
INR: 3.08 — ABNORMAL HIGH (ref <1.50)
Prothrombin Time: 31.8 seconds — ABNORMAL HIGH (ref 11.6–15.2)

## 2014-07-11 ENCOUNTER — Ambulatory Visit: Payer: Self-pay | Admitting: Family Medicine

## 2014-07-11 DIAGNOSIS — I4891 Unspecified atrial fibrillation: Secondary | ICD-10-CM

## 2014-07-11 NOTE — Progress Notes (Signed)
Patient advised of dose changes and recommendations of follow up.

## 2014-07-25 ENCOUNTER — Ambulatory Visit: Payer: Medicare Other

## 2014-07-25 ENCOUNTER — Other Ambulatory Visit: Payer: Self-pay | Admitting: Family Medicine

## 2014-07-26 ENCOUNTER — Ambulatory Visit: Payer: Medicare Other

## 2014-07-29 ENCOUNTER — Ambulatory Visit: Payer: Self-pay | Admitting: Family Medicine

## 2014-07-29 ENCOUNTER — Encounter: Payer: Self-pay | Admitting: *Deleted

## 2014-07-29 ENCOUNTER — Ambulatory Visit (INDEPENDENT_AMBULATORY_CARE_PROVIDER_SITE_OTHER): Payer: Medicare Other | Admitting: Family Medicine

## 2014-07-29 DIAGNOSIS — I4891 Unspecified atrial fibrillation: Secondary | ICD-10-CM

## 2014-07-29 LAB — POCT INR: INR: 2.5

## 2014-07-29 NOTE — Progress Notes (Signed)
   Subjective:    Patient ID: Terri Small, female    DOB: 10/21/1930, 80 y.o.   MRN: [id number] Pt in for INR check today.  Her result is 2.5.  She is taking 2.5mg  on Mon, Thurs, & Sat; 5mg  all other days.  Denies any missed doses, bruising, or diet changes.  Beatris Ship, CMA HPI    Review of Systems     Objective:   Physical Exam        Assessment & Plan:

## 2014-07-29 NOTE — Progress Notes (Signed)
   Subjective:    Patient ID: Terri Small, female    DOB: April 13, 1931, 80 y.o.   MRN: [id number] Pt notified of dosing instructions.  Beatris Ship, CMA HPI    Review of Systems     Objective:   Physical Exam        Assessment & Plan:

## 2014-08-02 ENCOUNTER — Other Ambulatory Visit: Payer: Self-pay | Admitting: Family Medicine

## 2014-08-10 ENCOUNTER — Other Ambulatory Visit: Payer: Self-pay | Admitting: Family Medicine

## 2014-08-14 ENCOUNTER — Ambulatory Visit (INDEPENDENT_AMBULATORY_CARE_PROVIDER_SITE_OTHER): Payer: Medicare Other | Admitting: Family Medicine

## 2014-08-14 VITALS — BP 114/68 | HR 71 | Temp 97.6°F

## 2014-08-14 DIAGNOSIS — I4891 Unspecified atrial fibrillation: Secondary | ICD-10-CM

## 2014-08-14 DIAGNOSIS — Z23 Encounter for immunization: Secondary | ICD-10-CM

## 2014-08-14 LAB — POCT INR: INR: 2.6

## 2014-08-14 NOTE — Progress Notes (Signed)
Magalene aware of plan of care and same dosing schedule with repeat inr in 3 weeks. Margette Fast, RMA

## 2014-09-04 ENCOUNTER — Other Ambulatory Visit: Payer: Self-pay | Admitting: Family Medicine

## 2014-09-04 LAB — PROTIME-INR
INR: 2.02 — AB (ref <1.50)
Prothrombin Time: 22.9 seconds — ABNORMAL HIGH (ref 11.6–15.2)

## 2014-09-05 ENCOUNTER — Ambulatory Visit: Payer: Self-pay | Admitting: Family Medicine

## 2014-09-05 DIAGNOSIS — I4891 Unspecified atrial fibrillation: Secondary | ICD-10-CM

## 2014-10-21 ENCOUNTER — Other Ambulatory Visit: Payer: Self-pay | Admitting: Family Medicine

## 2014-10-22 ENCOUNTER — Ambulatory Visit: Payer: Self-pay | Admitting: Family Medicine

## 2014-10-22 DIAGNOSIS — I4891 Unspecified atrial fibrillation: Secondary | ICD-10-CM

## 2014-10-22 LAB — PROTIME-INR
INR: 3.09 — ABNORMAL HIGH (ref <1.50)
Prothrombin Time: 31.9 seconds — ABNORMAL HIGH (ref 11.6–15.2)

## 2014-10-22 NOTE — Progress Notes (Signed)
Patient's daughter advised.  

## 2014-10-24 ENCOUNTER — Ambulatory Visit (INDEPENDENT_AMBULATORY_CARE_PROVIDER_SITE_OTHER): Payer: Medicare Other

## 2014-10-24 ENCOUNTER — Ambulatory Visit (INDEPENDENT_AMBULATORY_CARE_PROVIDER_SITE_OTHER): Payer: Medicare Other | Admitting: Family Medicine

## 2014-10-24 ENCOUNTER — Encounter: Payer: Self-pay | Admitting: Family Medicine

## 2014-10-24 VITALS — BP 133/92 | HR 105 | Temp 97.7°F | Wt 161.0 lb

## 2014-10-24 DIAGNOSIS — I517 Cardiomegaly: Secondary | ICD-10-CM

## 2014-10-24 DIAGNOSIS — J441 Chronic obstructive pulmonary disease with (acute) exacerbation: Secondary | ICD-10-CM

## 2014-10-24 DIAGNOSIS — R911 Solitary pulmonary nodule: Secondary | ICD-10-CM

## 2014-10-24 MED ORDER — AZITHROMYCIN 250 MG PO TABS: ORAL_TABLET | ORAL | Status: DC

## 2014-10-24 MED ORDER — PREDNISONE 20 MG PO TABS: ORAL_TABLET | ORAL | Status: DC

## 2014-10-24 NOTE — Progress Notes (Signed)
CC: Terri Small is a 81 y.o. female is here for Nasal Congestion   Subjective: HPI:  Nonproductive cough and wheezing that has been present for the past 24 hours that came on abruptly following the almost full resolution of nasal congestion and sore throat that began a week ago. Symptoms currently are moderate in severity. No benefit from Robitussin. No other interventions as of yet. Present all hours of the day since onset 24 hours ago. Accompanied by left upper posterior back pain that is worse with deep breathing or movement of her left arm into extension. This also began about 24 hours ago. She denies any new shortness of breath, fevers, chills, night sweats nor chest pain.      Review Of Systems Outlined In HPI  Past Medical History  Diagnosis Date  . 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  . Osteoporosis   . Herpes zoster     Past Surgical History  Procedure Laterality Date  . Abdominal hysterectomy      Vaginal  . Cholecystectomy      Laparoscopic  . Cardiac catheterization      W/ Ablation   Family History  Problem Relation Age of Onset  . Stroke Father   . Atrial fibrillation    . Emphysema Father     History   Social History  . Marital Status: Married    Spouse Name: [name]    Number of Children: 4  . Years of Education: N/A   Occupational History  . Retired Network engineer   .     Social History Main Topics  . Smoking status: Former Smoker -- 1.00 packs/day for 6 years    Types: Cigarettes    Quit date: 10/18/1960  . Smokeless tobacco: Never Used  . Alcohol Use: 1.0 oz/week    2 drink(s) per week     Comment: 2 oz wine 2-3 times weekly  . Drug Use: No  . Sexual Activity: Not on file   Other Topics Concern  . Not on file   Social History Narrative   Retired.  Some college. Married to her husband  Terri Small who has mod Estate manager/land agent.   Has 4 adult children. Lives with his daughter.     Regular  exercise-yes     Objective: BP 133/92 mmHg  Pulse 105  Temp(Src) 97.7 F (36.5 C) (Oral)  Wt 161 lb (73.029 kg)  SpO2 96%  General: Alert and Oriented, No Acute Distress HEENT: Pupils equal, round, reactive to light. Conjunctivae clear.  External ears unremarkable, canals clear with intact TMs with appropriate landmarks.  Middle ear appears open without effusion. Pink inferior turbinates.  Moist mucous membranes, pharynx without inflammation nor lesions.  Neck supple without palpable lymphadenopathy nor abnormal masses. Lungs: Comfortable work of breathing with wheezing in all lung fields mild in severity, no rhonchi or rales. Back: She localizes her pain near the rhomboids on the left however I'm unable to elicit any pain with palpation or with any resisted maneuvers of her left shoulder. Extremities: No peripheral edema.  Strong peripheral pulses.  Mental Status: No depression, anxiety, nor agitation. Skin: Warm and dry.  Assessment & Plan: Terri Small was seen today for nasal congestion.  Diagnoses and associated orders for this visit:  COPD exacerbation - predniSONE (DELTASONE) 20 MG tablet; Three tabs at once daily for five days. - azithromycin (ZITHROMAX) 250 MG tablet; Take two tabs at once on day 1, then  one tab daily on days 2-5. - DG Chest 2 View; Future    COPD exacerbation to be treated with azithromycin and prednisone, I'm also obtain an x-ray of the chest to determine if she has pneumonia given her left upper back pain with breathing, if she winds up also having pneumonia my tentative plan would be to add Omnicef to the above regimen. Patient will be updated with ultimate plan once x-ray results are available.   Return if symptoms worsen or fail to improve.

## 2014-10-29 ENCOUNTER — Ambulatory Visit: Payer: Self-pay | Admitting: Family Medicine

## 2014-10-29 ENCOUNTER — Ambulatory Visit (INDEPENDENT_AMBULATORY_CARE_PROVIDER_SITE_OTHER): Payer: Medicare Other | Admitting: Family Medicine

## 2014-10-29 ENCOUNTER — Encounter: Payer: Self-pay | Admitting: Family Medicine

## 2014-10-29 VITALS — BP 133/90 | HR 100 | Ht 62.0 in | Wt 158.0 lb

## 2014-10-29 DIAGNOSIS — I4891 Unspecified atrial fibrillation: Secondary | ICD-10-CM

## 2014-10-29 DIAGNOSIS — N3289 Other specified disorders of bladder: Secondary | ICD-10-CM

## 2014-10-29 DIAGNOSIS — Z23 Encounter for immunization: Secondary | ICD-10-CM

## 2014-10-29 DIAGNOSIS — Z87440 Personal history of urinary (tract) infections: Secondary | ICD-10-CM

## 2014-10-29 LAB — POCT INR: INR: 2.3

## 2014-10-29 MED ORDER — FUROSEMIDE 20 MG PO TABS: 20.0000 mg | ORAL_TABLET | Freq: Every day | ORAL | Status: DC

## 2014-10-29 MED ORDER — WARFARIN SODIUM 5 MG PO TABS: 5.0000 mg | ORAL_TABLET | Freq: Every day | ORAL | Status: DC

## 2014-10-29 MED ORDER — DILTIAZEM HCL ER COATED BEADS 240 MG PO CP24: 240.0000 mg | ORAL_CAPSULE | Freq: Every day | ORAL | Status: DC

## 2014-10-29 NOTE — Progress Notes (Signed)
   Subjective:    Patient ID: Terri Small, female    DOB: May 30, 1931, 81 y.o.   MRN: [id number]  HPI Patient is here today for hospital follow-up. She was seen over [facility] on December 31, 12 days ago. She was having lower abdominal pain that she rated 9 out of 10 when she went. She does have a history of recurrent UTIs and is followed by urology at [facility].. bladder spasms doing better. She had a CT the abdomen which was negative for urinary calculus or hydronephrosis. She was given a prescription for uro-blue for bladder spasms and told to follow with urology.She hasn't tried the medication. Tylenol helps some.    she would like to discuss Prevnar 13,and whether or not she should continue trimethoprim since she does well on estrace  Anticoag - Has been on antibiotic recently.  Did miss her dose last night and took a half tab daily   Frequent UTI- she's been using estrogen cream it has been very helpful. She wonders if she needs to continue the trimethoprim prophylactically or not.  Review of Systems     Objective:   Physical Exam  Constitutional: She is oriented to person, place, and time. She appears well-developed and well-nourished.  HENT:  Head: Normocephalic and atraumatic.  Cardiovascular: Normal rate and normal heart sounds.   Irregular rhythm and normal rate.  Pulmonary/Chest: Effort normal and breath sounds normal.  Neurological: She is alert and oriented to person, place, and time.  Skin: Skin is warm and dry.  Psychiatric: She has a normal mood and affect. Her behavior is normal.          Assessment & Plan:  Recurrent UTI - since she has been on a topical estrogen for about 2 months I think it would be okay for her to strive discontinuing the trimethoprim. We'll just have to monitor over the next 2, 4, and 6 months to see if she does well and avoids getting a urinary tract infection. She continues to use a topical estrogen daily. Though she has  dropped down from 2 g today to 1 g per day. Since she has been on it for 2 months she can consider going to maintenance therapy, so she can decrease her dose to 2-3 times per week instead of daily. She does have a urologist follow-up soon in the next month. Certainly she could wait and discuss with him if she would like since he is the prescribing physician for this medication.  Afib - See anticoag flowsheet for adjustments. Even though she missed her dose last night her INR is better. Recommend repeat in 3 weeks. She can certainly come in sooner if she needs before she leaves for [place].  Bladder spasms-her symptoms have improved significantly. She could certainly try Azo-Standard over-the-counter next time this occurs. She never filled the antispasmodic that they gave her. When she read the side effect profile she was very concerned about it. She also says that sometimes taking Tylenol taking plenty of fluid seems to help as well. She did not have a urinary tract infection.  Given Prevnar 13 today.    She is going to [place] again.  Wants R xprinted for 90 days. Prescriptions printed, signed and given.

## 2014-10-30 ENCOUNTER — Other Ambulatory Visit: Payer: Self-pay | Admitting: Family Medicine

## 2014-10-30 NOTE — Addendum Note (Signed)
Addended by: Teddy Spike on: 10/30/2014 07:53 AM   Modules accepted: Orders

## 2014-11-05 ENCOUNTER — Telehealth: Payer: Self-pay

## 2014-11-05 ENCOUNTER — Other Ambulatory Visit: Payer: Self-pay | Admitting: Family Medicine

## 2014-11-05 DIAGNOSIS — Z87440 Personal history of urinary (tract) infections: Secondary | ICD-10-CM

## 2014-11-05 NOTE — Telephone Encounter (Signed)
Terri Small called and left a message stating that she is experiencing UTI symptoms. Returned called and left a message for her to return our call.

## 2014-11-05 NOTE — Telephone Encounter (Signed)
Patient called back and reports bladder pain. Scheduled her for an appointment.Marland Kitchen

## 2014-11-06 ENCOUNTER — Encounter: Payer: Self-pay | Admitting: Physician Assistant

## 2014-11-06 ENCOUNTER — Ambulatory Visit (INDEPENDENT_AMBULATORY_CARE_PROVIDER_SITE_OTHER): Payer: Medicare Other | Admitting: Physician Assistant

## 2014-11-06 VITALS — BP 99/64 | HR 89 | Wt 158.0 lb

## 2014-11-06 DIAGNOSIS — B001 Herpesviral vesicular dermatitis: Secondary | ICD-10-CM | POA: Diagnosis not present

## 2014-11-06 DIAGNOSIS — N3000 Acute cystitis without hematuria: Secondary | ICD-10-CM | POA: Diagnosis not present

## 2014-11-06 DIAGNOSIS — R300 Dysuria: Secondary | ICD-10-CM | POA: Diagnosis not present

## 2014-11-06 HISTORY — DX: Herpesviral vesicular dermatitis: B00.1

## 2014-11-06 LAB — POCT URINALYSIS DIPSTICK
Bilirubin, UA: NEGATIVE
Blood, UA: NEGATIVE
Glucose, UA: NEGATIVE
Ketones, UA: NEGATIVE
NITRITE UA: POSITIVE
PROTEIN UA: NEGATIVE
Spec Grav, UA: 1.015
UROBILINOGEN UA: 0.2
pH, UA: 5.5

## 2014-11-06 MED ORDER — VALACYCLOVIR HCL 1 G PO TABS: ORAL_TABLET | ORAL | Status: DC

## 2014-11-06 MED ORDER — NITROFURANTOIN MONOHYD MACRO 100 MG PO CAPS: 100.0000 mg | ORAL_CAPSULE | Freq: Two times a day (BID) | ORAL | Status: DC

## 2014-11-06 NOTE — Progress Notes (Signed)
   Subjective:    Patient ID: Terri Small, female    DOB: February 04, 1931, 81 y.o.   MRN: [id number]  HPI  Patient is an 81 year old female who presents to the clinic with urinary tract symptoms that have worsened over the past 2 days. She feels like she has a "sore throat in her vagina". She denies any discharge vaginally or itching. She denies any fever, chills, nausea or vomiting. She does have a history of urinary tract infections. She does see a urologist regularly. She was on prophylactic antibiotics which she thought she could stop last week. She decided to stop them and wonders if this is why she has developed symptoms.   Patient also has a sore of her right lower lip. She does have a history of fever blisters.. She is only use Vaseline to make better. It is very tender and tingly.   Review of Systems  All other systems reviewed and are negative.      Objective:   Physical Exam  Constitutional: She is oriented to person, place, and time. She appears well-developed and well-nourished.  HENT:  Head: Normocephalic and atraumatic.    Cardiovascular: Normal rate, regular rhythm and normal heart sounds.   Pulmonary/Chest: Effort normal and breath sounds normal. She has no wheezes.  No CVA tenderness.   Abdominal: Soft. Bowel sounds are normal. She exhibits no distension and no mass. There is no tenderness. There is no rebound and no guarding.  Neurological: She is alert and oriented to person, place, and time.  Skin: Skin is dry.  Psychiatric: She has a normal mood and affect. Her behavior is normal.          Assessment & Plan:  Dysuria/acute cystitis without hematuria- .. Results for orders placed or performed in visit on 11/06/14  POCT urinalysis dipstick  Result Value Ref Range   Color, UA amber    Clarity, UA slightly cloudy    Glucose, UA neg    Bilirubin, UA neg    Ketones, UA neg    Spec Grav, UA 1.015    Blood, UA neg    pH, UA 5.5    Protein, UA neg    Urobilinogen, UA 0.2    Nitrite, UA pos    Leukocytes, UA small (1+)    Will culture. Patient requests that we send results to her urologist. We made a copy of fax number for urology to do this. I did go ahead and start her on Macrobid twice daily for 7 days to cover infection. If culture reveals not sensitive will change medication. Patient encouraged to increase hydration. I certainly think this infection could feeling to stopping her prophylactic antibiotics. I encouraged her when she finished this current medication for acute infection to go back on her prophylactic treatment.  Fever blister-Valtrex was given to take 2 tabs of 1000 mg    every 12 hours for one day for acute exacerbations. reminded patient is agreeable over the counter that is topical that she can also try.

## 2014-11-06 NOTE — Telephone Encounter (Signed)
Terri Small called and states macrobid aggravates her pulmonary fibrosis, per her pulmonologist. She would like a different medication sent to pharmacy. Please advise.

## 2014-11-06 NOTE — Telephone Encounter (Signed)
Ok

## 2014-11-06 NOTE — Telephone Encounter (Signed)
Patient states she will take the [facility] and call if she has any breathing problems. She states she was on the [facility] for long term prophylactic use and maybe the short term, 7 day use will not effect her pulmonary fibrosis.

## 2014-11-06 NOTE — Addendum Note (Signed)
Addended by: Jamesetta So on: 11/06/2014 04:38 PM   Modules accepted: Orders

## 2014-11-06 NOTE — Telephone Encounter (Signed)
[place] for keflex 500mg  bid for 10 days.

## 2014-11-09 LAB — URINE CULTURE: Colony Count: >=100000

## 2014-11-12 ENCOUNTER — Other Ambulatory Visit: Payer: Self-pay | Admitting: Family Medicine

## 2014-11-14 ENCOUNTER — Other Ambulatory Visit: Payer: Self-pay | Admitting: Family Medicine

## 2014-11-14 NOTE — Telephone Encounter (Signed)
Dr. Madilyn Fireman,   I don't see this med on pt's med list and isn't this an abx? I don't see a dx that could be associated with this med. Please advise. i think I already denied this yesterday under the reason to have pt contact office

## 2014-11-21 ENCOUNTER — Other Ambulatory Visit: Payer: Self-pay | Admitting: Family Medicine

## 2014-11-21 LAB — PROTIME-INR
INR: 1.9 — ABNORMAL HIGH (ref <1.50)
PROTHROMBIN TIME: 21.8 s — AB (ref 11.6–15.2)

## 2014-11-22 ENCOUNTER — Telehealth: Payer: Self-pay | Admitting: *Deleted

## 2014-11-22 ENCOUNTER — Ambulatory Visit: Payer: Self-pay | Admitting: Family Medicine

## 2014-11-22 DIAGNOSIS — I4891 Unspecified atrial fibrillation: Secondary | ICD-10-CM

## 2014-11-22 NOTE — Telephone Encounter (Signed)
Increase dose.  Take half tab (2.5mg ) on Monday and Thursday. 5 mg all other days. Repeat in 2 weeks

## 2014-12-05 ENCOUNTER — Other Ambulatory Visit: Payer: Self-pay | Admitting: Family Medicine

## 2014-12-06 ENCOUNTER — Ambulatory Visit: Payer: Self-pay | Admitting: Family Medicine

## 2014-12-06 DIAGNOSIS — I4891 Unspecified atrial fibrillation: Secondary | ICD-10-CM

## 2014-12-06 LAB — PROTIME-INR
INR: 1.9 — ABNORMAL HIGH (ref <1.50)
PROTHROMBIN TIME: 21.8 s — AB (ref 11.6–15.2)

## 2014-12-06 NOTE — Progress Notes (Signed)
Patient advised of recommendations.  

## 2014-12-19 ENCOUNTER — Other Ambulatory Visit: Payer: Self-pay

## 2014-12-19 MED ORDER — TRIMETHOPRIM 100 MG PO TABS: 100.0000 mg | ORAL_TABLET | Freq: Every day | ORAL | Status: DC

## 2014-12-20 ENCOUNTER — Other Ambulatory Visit: Payer: Self-pay | Admitting: Family Medicine

## 2014-12-21 LAB — PROTIME-INR
INR: 2.63 — AB (ref <1.50)
Prothrombin Time: 28.1 seconds — ABNORMAL HIGH (ref 11.6–15.2)

## 2014-12-22 ENCOUNTER — Ambulatory Visit: Payer: Self-pay | Admitting: Family Medicine

## 2014-12-22 DIAGNOSIS — I4891 Unspecified atrial fibrillation: Secondary | ICD-10-CM

## 2014-12-23 ENCOUNTER — Other Ambulatory Visit: Payer: Self-pay

## 2014-12-23 MED ORDER — TRIMETHOPRIM 100 MG PO TABS: 100.0000 mg | ORAL_TABLET | Freq: Every day | ORAL | Status: DC

## 2015-01-22 ENCOUNTER — Telehealth: Payer: Self-pay | Admitting: *Deleted

## 2015-01-22 NOTE — Telephone Encounter (Signed)
Pt left vm wanting you to know that her last INR was 2.6.  She is in [place] right now and they're supposed to check it again in two weeks.

## 2015-01-22 NOTE — Telephone Encounter (Signed)
Sounds perfect. Continue current regimen. Let us know in 2 weeks.

## 2015-02-03 ENCOUNTER — Telehealth: Payer: Self-pay | Admitting: *Deleted

## 2015-02-03 NOTE — Telephone Encounter (Signed)
Pt called and stated that her INR results were to be faxed to Dr. Madilyn Fireman. Her results were 1.8. She stated that if she should need any medications it should be sent to [facility] in [place] their # is [phone]

## 2015-02-04 ENCOUNTER — Ambulatory Visit: Payer: Self-pay | Admitting: Family Medicine

## 2015-02-04 DIAGNOSIS — I4891 Unspecified atrial fibrillation: Secondary | ICD-10-CM

## 2015-02-04 LAB — PROTIME-INR: INR: 1.8 — AB (ref 0.9–1.1)

## 2015-02-04 NOTE — Telephone Encounter (Signed)
See intake regulation flowsheet for adjustments to Coumadin. Not sure if she needs refills or not.

## 2015-02-04 NOTE — Telephone Encounter (Signed)
   Increase dose. 5 mg all other days. Repeat in 2 weeks.    Pt advised she already had an rx for 5 mg tabs. Maryruth Eve, Lahoma Crocker

## 2015-02-08 ENCOUNTER — Other Ambulatory Visit: Payer: Self-pay | Admitting: Family Medicine

## 2015-02-08 DIAGNOSIS — I4891 Unspecified atrial fibrillation: Secondary | ICD-10-CM

## 2015-02-14 LAB — PROTIME-INR: INR: 3.5 — AB (ref 0.9–1.1)

## 2015-02-18 ENCOUNTER — Other Ambulatory Visit: Payer: Self-pay | Admitting: *Deleted

## 2015-02-18 MED ORDER — WARFARIN SODIUM 4 MG PO TABS: 4.0000 mg | ORAL_TABLET | Freq: Every day | ORAL | Status: DC

## 2015-02-19 ENCOUNTER — Telehealth: Payer: Self-pay | Admitting: *Deleted

## 2015-02-19 NOTE — Telephone Encounter (Signed)
PT results came in from [place] as 3.5.  Jade changed coumadin therapy to 5mg  every day except Wed & Sun-those two days are to be 4mg .  I spoke with pt and notified her of new schedule and to have it rechecked in 2 weeks.  I also refilled her 4mg  warfarin.

## 2015-03-05 ENCOUNTER — Telehealth: Payer: Self-pay | Admitting: *Deleted

## 2015-03-05 DIAGNOSIS — I4891 Unspecified atrial fibrillation: Secondary | ICD-10-CM

## 2015-03-05 LAB — PROTIME-INR: INR: 2.7 — AB (ref 0.9–1.1)

## 2015-03-05 NOTE — Telephone Encounter (Signed)
Pt/inr order needed to be faxed to [phone]. Order faxed.Terri Small [place]

## 2015-03-06 ENCOUNTER — Telehealth: Payer: Self-pay | Admitting: *Deleted

## 2015-03-06 NOTE — Telephone Encounter (Signed)
Pt called inquiring about her INR results and whether or not we have received them. If not to call her back so that she can contact the lab that did them.Terri Small [name]

## 2015-03-07 ENCOUNTER — Telehealth: Payer: Self-pay | Admitting: *Deleted

## 2015-03-07 ENCOUNTER — Ambulatory Visit: Payer: Self-pay | Admitting: Family Medicine

## 2015-03-07 DIAGNOSIS — I4891 Unspecified atrial fibrillation: Secondary | ICD-10-CM

## 2015-03-07 LAB — PROTIME-INR: INR: 2.7 — AB (ref 0.9–1.1)

## 2015-03-07 NOTE — Telephone Encounter (Signed)
See coumadin flowhseet

## 2015-03-07 NOTE — Telephone Encounter (Signed)
Spoke with Lesha @ [facility] lab she gave me the pt's INR results. 2.7 will fwd to pcp for advice.Audelia Hives [place]

## 2015-03-07 NOTE — Telephone Encounter (Signed)
Called and lvm informing pt that she will continue to take the same dose5 mg all days. Repeat in 3 weeks.  Maryruth Eve, [place] Crocker

## 2015-04-07 LAB — PROTIME-INR: INR: 3.9 — AB (ref 0.9–1.1)

## 2015-04-09 ENCOUNTER — Ambulatory Visit: Payer: Self-pay | Admitting: Family Medicine

## 2015-04-09 DIAGNOSIS — I4891 Unspecified atrial fibrillation: Secondary | ICD-10-CM

## 2015-04-09 NOTE — Addendum Note (Signed)
Addended by: Beatrice Lecher D on: 04/09/2015 05:07 PM   Modules accepted: Level of Service

## 2015-04-10 NOTE — Progress Notes (Signed)
Left information on voicemail and provided callback information for any questions and to schedule f/u appt.

## 2015-04-24 ENCOUNTER — Ambulatory Visit: Payer: Medicare Other | Admitting: Family Medicine

## 2015-04-28 ENCOUNTER — Ambulatory Visit (INDEPENDENT_AMBULATORY_CARE_PROVIDER_SITE_OTHER): Payer: Medicare Other | Admitting: Family Medicine

## 2015-04-28 ENCOUNTER — Encounter: Payer: Self-pay | Admitting: Family Medicine

## 2015-04-28 VITALS — BP 121/81 | HR 84 | Wt 162.0 lb

## 2015-04-28 DIAGNOSIS — J309 Allergic rhinitis, unspecified: Secondary | ICD-10-CM

## 2015-04-28 DIAGNOSIS — I4891 Unspecified atrial fibrillation: Secondary | ICD-10-CM | POA: Diagnosis not present

## 2015-04-28 DIAGNOSIS — R300 Dysuria: Secondary | ICD-10-CM

## 2015-04-28 DIAGNOSIS — I5032 Chronic diastolic (congestive) heart failure: Secondary | ICD-10-CM | POA: Diagnosis not present

## 2015-04-28 DIAGNOSIS — Z87440 Personal history of urinary (tract) infections: Secondary | ICD-10-CM

## 2015-04-28 NOTE — Patient Instructions (Signed)
DASH Eating Plan °DASH stands for "Dietary Approaches to Stop Hypertension." The DASH eating plan is a healthy eating plan that has been shown to reduce high blood pressure (hypertension). Additional health benefits may include reducing the risk of type 2 diabetes mellitus, heart disease, and stroke. The DASH eating plan may also help with weight loss. °WHAT DO I NEED TO KNOW ABOUT THE DASH EATING PLAN? °For the DASH eating plan, you will follow these general guidelines: °· Choose foods with a percent daily value for sodium of less than 5% (as listed on the food label). °· Use salt-free seasonings or herbs instead of table salt or sea salt. °· Check with your health care provider or pharmacist before using salt substitutes. °· Eat lower-sodium products, often labeled as "lower sodium" or "no salt added." °· Eat fresh foods. °· Eat more vegetables, fruits, and low-fat dairy products. °· Choose whole grains. Look for the word "whole" as the first word in the ingredient list. °· Choose fish and skinless chicken or turkey more often than red meat. Limit fish, poultry, and meat to 6 oz (170 g) each day. °· Limit sweets, desserts, sugars, and sugary drinks. °· Choose heart-healthy fats. °· Limit cheese to 1 oz (28 g) per day. °· Eat more home-cooked food and less restaurant, buffet, and fast food. °· Limit fried foods. °· Cook foods using methods other than frying. °· Limit canned vegetables. If you do use them, rinse them well to decrease the sodium. °· When eating at a restaurant, ask that your food be prepared with less salt, or no salt if possible. °WHAT FOODS CAN I EAT? °Seek help from a dietitian for individual calorie needs. °Grains °Whole grain or whole wheat bread. Brown rice. Whole grain or whole wheat pasta. Quinoa, bulgur, and whole grain cereals. Low-sodium cereals. Corn or whole wheat flour tortillas. Whole grain cornbread. Whole grain crackers. Low-sodium crackers. °Vegetables °Fresh or frozen vegetables  (raw, steamed, roasted, or grilled). Low-sodium or reduced-sodium tomato and vegetable juices. Low-sodium or reduced-sodium tomato sauce and paste. Low-sodium or reduced-sodium canned vegetables.  °Fruits °All fresh, canned (in natural juice), or frozen fruits. °Meat and Other Protein Products °Ground beef (85% or leaner), grass-fed beef, or beef trimmed of fat. Skinless chicken or turkey. Ground chicken or turkey. Pork trimmed of fat. All fish and seafood. Eggs. Dried beans, peas, or lentils. Unsalted nuts and seeds. Unsalted canned beans. °Dairy °Low-fat dairy products, such as skim or 1% milk, 2% or reduced-fat cheeses, low-fat ricotta or cottage cheese, or plain low-fat yogurt. Low-sodium or reduced-sodium cheeses. °Fats and Oils °Tub margarines without trans fats. Light or reduced-fat mayonnaise and salad dressings (reduced sodium). Avocado. Safflower, olive, or canola oils. Natural peanut or almond butter. °Other °Unsalted popcorn and pretzels. °The items listed above may not be a complete list of recommended foods or beverages. Contact your dietitian for more options. °WHAT FOODS ARE NOT RECOMMENDED? °Grains °White bread. White pasta. White rice. Refined cornbread. Bagels and croissants. Crackers that contain trans fat. °Vegetables °Creamed or fried vegetables. Vegetables in a cheese sauce. Regular canned vegetables. Regular canned tomato sauce and paste. Regular tomato and vegetable juices. °Fruits °Dried fruits. Canned fruit in light or heavy syrup. Fruit juice. °Meat and Other Protein Products °Fatty cuts of meat. Ribs, chicken wings, bacon, sausage, bologna, salami, chitterlings, fatback, hot dogs, bratwurst, and packaged luncheon meats. Salted nuts and seeds. Canned beans with salt. °Dairy °Whole or 2% milk, cream, half-and-half, and cream cheese. Whole-fat or sweetened yogurt. Full-fat   cheeses or blue cheese. Nondairy creamers and whipped toppings. Processed cheese, cheese spreads, or cheese  curds. °Condiments °Onion and garlic salt, seasoned salt, table salt, and sea salt. Canned and packaged gravies. Worcestershire sauce. Tartar sauce. Barbecue sauce. Teriyaki sauce. Soy sauce, including reduced sodium. Steak sauce. Fish sauce. Oyster sauce. Cocktail sauce. Horseradish. Ketchup and mustard. Meat flavorings and tenderizers. Bouillon cubes. Hot sauce. Tabasco sauce. Marinades. Taco seasonings. Relishes. °Fats and Oils °Butter, stick margarine, lard, shortening, ghee, and bacon fat. Coconut, palm kernel, or palm oils. Regular salad dressings. °Other °Pickles and olives. Salted popcorn and pretzels. °The items listed above may not be a complete list of foods and beverages to avoid. Contact your dietitian for more information. °WHERE CAN I FIND MORE INFORMATION? °National Heart, Lung, and [employer] Institute: www.nhlbi.nih.gov/health/health-topics/topics/dash/ °Document Released: 09/23/2011 Document Revised: 02/18/2014 Document Reviewed: 08/08/2013 °ExitCare® Patient Information ©2015 [employer]. This information is not intended to replace advice given to you by your health care provider. Make sure you discuss any questions you have with your health care provider. ° °

## 2015-04-28 NOTE — Progress Notes (Signed)
   Subjective:    Patient ID: Terri Small, female    DOB: 03-06-1931, 81 y.o.   MRN: [id number]  HPI F/U a fib - on coumadin.  No easy bleeding.  Went for blood draw this AM.    While she was in [place] she had another UTI and it was not sensitive several days. Completed the rocephin injections aobut 2 weeks ao. She now drinks a lot of urine.  Had a UTI in January. Was on the estrogen cream. She feels like her sxs are much beter. No recent bladder spasms.  No fever, chills.    CHF - 3 x 16.9 ounces a day.  She hasn't had any problems with swelling more than usual.   She also got a terrible cough while in [place]. She would cough so hard she would vomit.  They put her on  Claritin. Still using some left over.  She just got back Wednesday.  She does feel beter.  She was also given tessalon perles.   Review of Systems     Objective:   Physical Exam  Constitutional: She is oriented to person, place, and time. She appears well-developed and well-nourished.  HENT:  Head: Normocephalic and atraumatic.  Cardiovascular: Normal rate, regular rhythm and normal heart sounds.   Pulmonary/Chest: Effort normal and breath sounds normal.  Musculoskeletal:  bilat trace ankle edema.   Neurological: She is alert and oriented to person, place, and time.  Skin: Skin is warm and dry.  Psychiatric: She has a normal mood and affect. Her behavior is normal.          Assessment & Plan:  Afib- due for coumading. Await lab findings and adjust as needed. She has given up chocolate.   CHF- stable.    recurent UTI - repeat culture to confirm infection has cleared. Restart topical estrogen cream. I really think it helped since her last UTI was about 5 mo ago.   BMI 29- discussed getting back on track with diet and exercise. Really needs to lose about 10 lbs.   AR- much improved on claritin.

## 2015-04-29 ENCOUNTER — Ambulatory Visit: Payer: Self-pay | Admitting: Family Medicine

## 2015-04-29 ENCOUNTER — Telehealth: Payer: Self-pay | Admitting: *Deleted

## 2015-04-29 DIAGNOSIS — I4891 Unspecified atrial fibrillation: Secondary | ICD-10-CM

## 2015-04-29 LAB — PROTIME-INR
INR: 2.36 — AB (ref <1.50)
PROTHROMBIN TIME: 25.8 s — AB (ref 11.6–15.2)

## 2015-04-29 NOTE — Telephone Encounter (Signed)
Pt informed to take same dose. Half a tab on Thrusday and 5 mg all other days. Repeat in 3 weeks. Maryruth Eve, [place] Crocker

## 2015-04-30 LAB — URINE CULTURE
Colony Count: NO GROWTH
ORGANISM ID, BACTERIA: NO GROWTH

## 2015-05-22 ENCOUNTER — Ambulatory Visit: Payer: Self-pay | Admitting: Family Medicine

## 2015-05-22 ENCOUNTER — Ambulatory Visit (INDEPENDENT_AMBULATORY_CARE_PROVIDER_SITE_OTHER): Payer: Medicare Other | Admitting: Family Medicine

## 2015-05-22 ENCOUNTER — Ambulatory Visit (INDEPENDENT_AMBULATORY_CARE_PROVIDER_SITE_OTHER): Payer: Medicare Other

## 2015-05-22 ENCOUNTER — Encounter: Payer: Self-pay | Admitting: Family Medicine

## 2015-05-22 VITALS — BP 115/77 | HR 76 | Ht 62.0 in | Wt 160.0 lb

## 2015-05-22 DIAGNOSIS — I4891 Unspecified atrial fibrillation: Secondary | ICD-10-CM

## 2015-05-22 DIAGNOSIS — M25612 Stiffness of left shoulder, not elsewhere classified: Secondary | ICD-10-CM

## 2015-05-22 DIAGNOSIS — M25512 Pain in left shoulder: Secondary | ICD-10-CM

## 2015-05-22 DIAGNOSIS — M12812 Other specific arthropathies, not elsewhere classified, left shoulder: Secondary | ICD-10-CM

## 2015-05-22 LAB — POCT INR: INR: 2.2
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
## 2015-05-22 NOTE — Patient Instructions (Signed)
After the x-ray we can hopefully move forward with the MRI but will require prior authorization with her insurance. Continue current dose of Coumadin and recheck in 4 weeks.

## 2015-06-02 ENCOUNTER — Ambulatory Visit (INDEPENDENT_AMBULATORY_CARE_PROVIDER_SITE_OTHER): Payer: Medicare Other

## 2015-06-02 DIAGNOSIS — M7582 Other shoulder lesions, left shoulder: Secondary | ICD-10-CM | POA: Diagnosis not present

## 2015-06-02 DIAGNOSIS — M25612 Stiffness of left shoulder, not elsewhere classified: Secondary | ICD-10-CM

## 2015-06-02 DIAGNOSIS — M25512 Pain in left shoulder: Secondary | ICD-10-CM

## 2015-06-02 DIAGNOSIS — M25412 Effusion, left shoulder: Secondary | ICD-10-CM

## 2015-06-03 ENCOUNTER — Other Ambulatory Visit: Payer: Self-pay | Admitting: *Deleted

## 2015-06-20 ENCOUNTER — Other Ambulatory Visit: Payer: Self-pay | Admitting: Family Medicine

## 2015-06-20 ENCOUNTER — Other Ambulatory Visit: Payer: Self-pay | Admitting: Orthopedic Surgery

## 2015-06-20 ENCOUNTER — Other Ambulatory Visit: Payer: Self-pay | Admitting: *Deleted

## 2015-06-20 DIAGNOSIS — I4891 Unspecified atrial fibrillation: Secondary | ICD-10-CM

## 2015-06-20 DIAGNOSIS — S42152A Displaced fracture of neck of scapula, left shoulder, initial encounter for closed fracture: Principal | ICD-10-CM

## 2015-06-20 DIAGNOSIS — S42142A Displaced fracture of glenoid cavity of scapula, left shoulder, initial encounter for closed fracture: Secondary | ICD-10-CM

## 2015-06-21 LAB — PROTIME-INR
INR: 2 — AB (ref <1.50)
PROTHROMBIN TIME: 23 s — AB (ref 11.6–15.2)

## 2015-06-23 ENCOUNTER — Ambulatory Visit: Payer: Self-pay | Admitting: Family Medicine

## 2015-06-23 DIAGNOSIS — I4891 Unspecified atrial fibrillation: Secondary | ICD-10-CM

## 2015-06-24 ENCOUNTER — Ambulatory Visit
Admission: RE | Admit: 2015-06-24 | Discharge: 2015-06-24 | Disposition: A | Discharge status: Home / Self Care | Payer: Medicare Other | Source: Ambulatory Visit | Attending: Orthopedic Surgery | Admitting: Orthopedic Surgery

## 2015-06-24 ENCOUNTER — Other Ambulatory Visit: Payer: Self-pay | Admitting: Orthopedic Surgery

## 2015-06-24 ENCOUNTER — Telehealth: Payer: Self-pay | Admitting: *Deleted

## 2015-06-24 DIAGNOSIS — S42152A Displaced fracture of neck of scapula, left shoulder, initial encounter for closed fracture: Principal | ICD-10-CM

## 2015-06-24 DIAGNOSIS — S42142A Displaced fracture of glenoid cavity of scapula, left shoulder, initial encounter for closed fracture: Secondary | ICD-10-CM

## 2015-06-24 NOTE — Telephone Encounter (Signed)
Pt called and lvm informing that she will be having shoulder surgery and wanted to know about having flu and pneumonia done.Terri Small [place]

## 2015-06-27 ENCOUNTER — Ambulatory Visit (INDEPENDENT_AMBULATORY_CARE_PROVIDER_SITE_OTHER): Payer: Medicare Other | Admitting: Family Medicine

## 2015-06-27 ENCOUNTER — Encounter: Payer: Self-pay | Admitting: Family Medicine

## 2015-06-27 VITALS — BP 115/67 | HR 61 | Temp 97.5°F | Ht 62.0 in | Wt 161.0 lb

## 2015-06-27 DIAGNOSIS — Z23 Encounter for immunization: Secondary | ICD-10-CM

## 2015-06-27 DIAGNOSIS — I4891 Unspecified atrial fibrillation: Secondary | ICD-10-CM | POA: Diagnosis not present

## 2015-06-27 DIAGNOSIS — M25511 Pain in right shoulder: Secondary | ICD-10-CM

## 2015-06-27 DIAGNOSIS — Z79899 Other long term (current) drug therapy: Secondary | ICD-10-CM | POA: Diagnosis not present

## 2015-06-27 NOTE — Progress Notes (Addendum)
   Subjective:    Patient ID: Terri Small, female    DOB: 1931-08-06, 81 y.o.   MRN: [id number]  HPI Her surgery is the 22nd for right total shoulder replacement.  They told her she continue current dose of coumadin.  She needs her flu shot today. She wantsa to knpow if should take her lasix that morning. Her surgery isn't until the afternoon.  She also wondered if she should take her trimethoprim that morning. She is very prone to UTIs and is worried about getting a postsurgical UTI. She is Re: Had her presurgical clearance done by her cardiologist and she is cleared for surgery.   Review of Systems     Objective:   Physical Exam  Constitutional: She is oriented to person, place, and time. She appears well-developed and well-nourished.  HENT:  Head: Normocephalic and atraumatic.  Eyes: Conjunctivae and EOM are normal.  Cardiovascular: Normal rate.   Pulmonary/Chest: Effort normal.  Neurological: She is alert and oriented to person, place, and time.  Skin: Skin is dry. No pallor.  Psychiatric: She has a normal mood and affect. Her behavior is normal.          Assessment & Plan:   medication management before right shoulder replacement scheduled. Discussed that I think she should skip her Lasix with a day specially since she has to be fasting in the afternoon. Okay to take the trimethoprim since she is prone to UTIs. Though they are going to try not  To Her during the surgery if possible. She was given her flu vaccine today and her pneumonia vaccine is up-to-date. She also had some questions about pertussis and she has been immunized against this.   atrial fibrillation-she's going to continue her current Coumadin dose per cardiology instead of increasing because she will be holding it before surgery.  Time spent 15 min,  Rate of 50% spent counseling about her medications before surgery and her vaccinations.

## 2015-07-03 ENCOUNTER — Ambulatory Visit (INDEPENDENT_AMBULATORY_CARE_PROVIDER_SITE_OTHER): Payer: Medicare Other | Admitting: Internal Medicine

## 2015-07-03 ENCOUNTER — Encounter: Payer: Self-pay | Admitting: Internal Medicine

## 2015-07-03 ENCOUNTER — Ambulatory Visit: Payer: Medicare Other | Admitting: Internal Medicine

## 2015-07-03 ENCOUNTER — Ambulatory Visit (INDEPENDENT_AMBULATORY_CARE_PROVIDER_SITE_OTHER)
Admission: RE | Admit: 2015-07-03 | Discharge: 2015-07-03 | Disposition: A | Discharge status: Home / Self Care | Payer: Medicare Other | Source: Ambulatory Visit | Attending: Internal Medicine | Admitting: Internal Medicine

## 2015-07-03 VITALS — BP 100/60 | HR 65 | Ht 63.0 in | Wt 160.4 lb

## 2015-07-03 DIAGNOSIS — J8410 Pulmonary fibrosis, unspecified: Secondary | ICD-10-CM

## 2015-07-03 DIAGNOSIS — R058 Other specified cough: Secondary | ICD-10-CM

## 2015-07-03 DIAGNOSIS — I5032 Chronic diastolic (congestive) heart failure: Secondary | ICD-10-CM

## 2015-07-03 DIAGNOSIS — R05 Cough: Secondary | ICD-10-CM | POA: Diagnosis not present

## 2015-07-03 NOTE — Assessment & Plan Note (Signed)
Echo: 08/16/2013-LVEF 47-99%, severe diastolic dysfunction, reversible sure to pattern of mitral inflow, left atrium moderately dilated, right atrium severely dilated, 3+ TR, right ventricle her systolic pressure elevated between 50-60 mm Hg, consistent with moderately severe pulmonary hypertension. Mild aortic valve thickening with mild regurgitation. Echo: 12/12/2013: LVEF 45-50%. Left and right atrium severely dilated. Diffuse hypokinesis in the left ventricle. Performed at [facility]. Echo: 05/16/14: LVEF 45-50%, biatrial dilations, 2+ MR, 3+TR, mild aortic sclerosis. Diffuse hypokinesis. [facility]  She tells me she's been cleared by her cardiologist for should surgery but he is not on staff in the [facility] but she understands this and based on what she and her cardiologist have talked about she is willing to go ahead with the surgery here.

## 2015-07-03 NOTE — Assessment & Plan Note (Signed)
The most common causes of chronic cough in immunocompetent adults include the following: upper airway cough syndrome (UACS), previously referred to as postnasal drip syndrome (PNDS), which is caused by variety of rhinosinus conditions; (2) asthma; (3) GERD; (4) chronic bronchitis from cigarette smoking or other inhaled environmental irritants; (5) nonasthmatic eosinophilic bronchitis; and (6) bronchiectasis.   These conditions, singly or in combination, have accounted for up to 94% of the causes of chronic cough in prospective studies.   Other conditions have constituted no >6% of the causes in prospective studies These have included bronchogenic carcinoma, chronic interstitial pneumonia, sarcoidosis, left ventricular failure, ACEI-induced cough, and aspiration from a condition associated with pharyngeal dysfunction.    Chronic cough is often simultaneously caused by more than one condition. A single cause has been found from 38 to 82% of the time, multiple causes from 18 to 62%. Multiply caused cough has been the result of three diseases up to 42% of the time.       Based on hx and exam, this is most likely:  Classic Upper airway cough syndrome, so named because it's frequently impossible to sort out how much is  CR/sinusitis with freq throat clearing (which can be related to primary GERD)   vs  causing  secondary (" extra esophageal")  GERD from wide swings in gastric pressure that occur with throat clearing, often  promoting self use of mint and menthol lozenges that reduce the lower esophageal sphincter tone and exacerbate the problem further in a cyclical fashion.   These are the same pts (now being labeled as having "irritable larynx syndrome" by some cough centers) who not infrequently have a history of having failed to tolerate ace inhibitors,  dry powder inhalers or biphosphonates or report having atypical reflux symptoms that don't respond to standard doses of PPI , and are easily confused as  having aecopd or asthma flares by even experienced allergists/ pulmonologists.   The first step is to maximize acid suppression and eliminate cyclical coughing then regroup if the cough persists.  I had an extended discussion with the patient reviewing all relevant studies completed to date and  lasting 25 m of 40 min ov  1) Explained: The standardized cough guidelines published in Chest by Lissa Morales in 2006 are still the best available and consist of a multiple step process (up to 12!) , not a single office visit,  and are intended  to address this problem logically,  with an alogrithm dependent on response to empiric treatment at  each progressive step  to determine a specific diagnosis with  minimal addtional testing needed. Therefore if adherence is an issue or can't be accurately verified,  it's very unlikely the standard evaluation and treatment will be successful here.    Furthermore, response to therapy (other than acute cough suppression, which should only be used short term with avoidance of narcotic containing cough syrups if possible), can be a gradual process for which the patient may perceive immediate benefit.  Unlike going to an eye doctor where the best perscription is almost always the first one and is immediately effective, this is almost never the case in the management of chronic cough syndromes. Therefore the patient needs to commit up front to consistently adhere to recommendations  for up to 6 weeks of therapy directed at the likely underlying problem(s) before the response can be reasonably evaluated.     2) Each maintenance medication was reviewed in detail including most importantly the difference between maintenance and  prns and under what circumstances the prns are to be triggered using an action plan format that is not reflected in the computer generated alphabetically organized AVS.    Please see instructions for details which were reviewed in writing and the  patient given a copy highlighting the part that I personally wrote and discussed at today's ov.   See instructions for specific recommendations which were reviewed directly with the patient who was given a copy with highlighter outlining the key components.
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
## 2015-07-03 NOTE — Patient Instructions (Addendum)
Try prilosec otc 20mg   Take 30-60 min before first meal of the day and Pepcid ac (famotidine) 20 mg one @  bedtime until cough is completely gone for at least a week without the need for cough suppression  For drainage /itchy throat/ take CHLORPHENIRAMINE 4 mg every 4 hours available over the counter (may cause drowsiness)  For cough best over the counter is DELSYM  2 tsp every 12 hours as needed   GERD (REFLUX)  is an extremely common cause of respiratory symptoms just like yours , many times with no obvious heartburn at all.    It can be treated with medication, but also with lifestyle changes including elevation of the head of your bed (ideally with 6 inch  bed blocks),  Smoking cessation, avoidance of late meals, excessive alcohol, and avoid fatty foods, chocolate, peppermint, colas, red wine, and acidic juices such as orange juice.  NO MINT OR MENTHOL PRODUCTS SO NO COUGH DROPS  USE SUGARLESS CANDY INSTEAD (Jolley ranchers or Stover's or Life Savers) or even ice chips will also do - the key is to swallow to prevent all throat clearing. NO OIL BASED VITAMINS - use powdered substitutes.  Please remember to go to the   x-ray department downstairs for your tests - we will call you with the results when they are available.     If you are satisfied with your treatment plan,  let your doctor know and he/she can either refill your medications or you can return here when your prescription runs out.     If in any way you are not 100% satisfied,  please tell us.  If 100% better, tell your friends!  Pulmonary follow up is as needed

## 2015-07-03 NOTE — Progress Notes (Signed)
Quick Note:  Called and spoke to pt. Reviewed results and recs. Pt voiced understanding and had no further questions. ______ 

## 2015-07-03 NOTE — Assessment & Plan Note (Signed)
Seen by pulm in [place] in 2008.   Diagnosed with emphysema years ago. Review of CTs from 2008: emphysema seen , only platelike atelectasis/ fibrosis.\ Recent CT 10/2013: awaiting f/u>> platelike atelectasis and basilar scar L>R LL.  No emphysema seen PFTs 2008: DLCO 82%  TLC 99%  FeV1 120% Spirometry 07/03/2013 shows an FVC of 97%, FEV1 of 102%, ratio 76%. No significant response to albuterol. - rec permanently stop macrodantin  12/25/13  - 07/03/2015   Walked RA  3 laps @ 185 ft each stopped due to  End of study, nl pace, no sob - sat 89% at very end    Clearly she never had sign copd but probably has a residual element of scarring from prev macrodantin induced fibrosis or previous ALI of some form and is at some risk to have shoulder surgery but it is not prohibitive

## 2015-07-04 ENCOUNTER — Ambulatory Visit: Payer: Medicare Other | Admitting: Family Medicine

## 2015-07-04 ENCOUNTER — Encounter (HOSPITAL_COMMUNITY)
Admission: RE | Admit: 2015-07-04 | Discharge: 2015-07-04 | Disposition: A | Discharge status: Home / Self Care | Payer: Medicare Other | Source: Ambulatory Visit | Attending: Orthopedic Surgery | Admitting: Orthopedic Surgery

## 2015-07-04 ENCOUNTER — Encounter (HOSPITAL_COMMUNITY): Payer: Self-pay

## 2015-07-04 DIAGNOSIS — I482 Chronic atrial fibrillation: Secondary | ICD-10-CM | POA: Diagnosis not present

## 2015-07-04 DIAGNOSIS — J449 Chronic obstructive pulmonary disease, unspecified: Secondary | ICD-10-CM | POA: Diagnosis not present

## 2015-07-04 DIAGNOSIS — Z01818 Encounter for other preprocedural examination: Secondary | ICD-10-CM | POA: Diagnosis present

## 2015-07-04 DIAGNOSIS — K219 Gastro-esophageal reflux disease without esophagitis: Secondary | ICD-10-CM | POA: Diagnosis not present

## 2015-07-04 DIAGNOSIS — I509 Heart failure, unspecified: Secondary | ICD-10-CM | POA: Insufficient documentation

## 2015-07-04 DIAGNOSIS — Z7901 Long term (current) use of anticoagulants: Secondary | ICD-10-CM | POA: Diagnosis not present

## 2015-07-04 DIAGNOSIS — Z01812 Encounter for preprocedural laboratory examination: Secondary | ICD-10-CM | POA: Diagnosis not present

## 2015-07-04 DIAGNOSIS — M19012 Primary osteoarthritis, left shoulder: Secondary | ICD-10-CM | POA: Diagnosis not present

## 2015-07-04 DIAGNOSIS — Z87891 Personal history of nicotine dependence: Secondary | ICD-10-CM | POA: Diagnosis not present

## 2015-07-04 DIAGNOSIS — Z79899 Other long term (current) drug therapy: Secondary | ICD-10-CM | POA: Insufficient documentation

## 2015-07-04 HISTORY — DX: Heart failure, unspecified: I50.9

## 2015-07-04 HISTORY — DX: Reserved for inherently not codable concepts without codable children: IMO0001

## 2015-07-04 HISTORY — DX: Chronic obstructive pulmonary disease, unspecified: J44.9

## 2015-07-04 HISTORY — DX: Unspecified osteoarthritis, unspecified site: M19.90

## 2015-07-04 HISTORY — DX: Headache, unspecified: R51.9

## 2015-07-04 HISTORY — DX: Headache: R51

## 2015-07-04 HISTORY — DX: Cardiac arrhythmia, unspecified: I49.9

## 2015-07-04 HISTORY — DX: Diverticulitis of intestine, part unspecified, without perforation or abscess without bleeding: K57.92

## 2015-07-04 HISTORY — DX: Gastro-esophageal reflux disease without esophagitis: K21.9

## 2015-07-04 LAB — CBC WITH DIFFERENTIAL/PLATELET
Basophils Absolute: 0 10*3/uL (ref 0.0–0.1)
Basophils Relative: 0 %
Eosinophils Absolute: 0.1 10*3/uL (ref 0.0–0.7)
Eosinophils Relative: 2 %
HCT: 41.9 % (ref 36.0–46.0)
HEMOGLOBIN: 13.9 g/dL (ref 12.0–15.0)
LYMPHS ABS: 1.4 10*3/uL (ref 0.7–4.0)
LYMPHS PCT: 26 %
MCH: 32.6 pg (ref 26.0–34.0)
MCHC: 33.2 g/dL (ref 30.0–36.0)
MCV: 98.1 fL (ref 78.0–100.0)
Monocytes Absolute: 0.7 10*3/uL (ref 0.1–1.0)
Monocytes Relative: 12 %
NEUTROS ABS: 3.2 10*3/uL (ref 1.7–7.7)
NEUTROS PCT: 60 %
Platelets: 199 10*3/uL (ref 150–400)
RBC: 4.27 MIL/uL (ref 3.87–5.11)
RDW: 13.7 % (ref 11.5–15.5)
WBC: 5.4 10*3/uL (ref 4.0–10.5)

## 2015-07-04 LAB — URINALYSIS, ROUTINE W REFLEX MICROSCOPIC
BILIRUBIN URINE: NEGATIVE
GLUCOSE, UA: NEGATIVE mg/dL
HGB URINE DIPSTICK: NEGATIVE
Ketones, ur: 15 mg/dL — AB
Leukocytes, UA: NEGATIVE
Nitrite: NEGATIVE
PH: 6 (ref 5.0–8.0)
Protein, ur: NEGATIVE mg/dL
SPECIFIC GRAVITY, URINE: 1.02 (ref 1.005–1.030)
Urobilinogen, UA: 1 mg/dL (ref 0.0–1.0)

## 2015-07-04 LAB — PROTIME-INR
INR: 2.4 — ABNORMAL HIGH (ref 0.00–1.49)
PROTHROMBIN TIME: 25.9 s — AB (ref 11.6–15.2)

## 2015-07-04 LAB — COMPREHENSIVE METABOLIC PANEL
ALT: 20 U/L (ref 14–54)
AST: 31 U/L (ref 15–41)
Albumin: 3.8 g/dL (ref 3.5–5.0)
Alkaline Phosphatase: 73 U/L (ref 38–126)
Anion gap: 7 (ref 5–15)
BILIRUBIN TOTAL: 0.9 mg/dL (ref 0.3–1.2)
BUN: 14 mg/dL (ref 6–20)
CALCIUM: 9.2 mg/dL (ref 8.9–10.3)
CO2: 26 mmol/L (ref 22–32)
CREATININE: 0.94 mg/dL (ref 0.44–1.00)
Chloride: 104 mmol/L (ref 101–111)
GFR calc Af Amer: >60 mL/min (ref >60)
GFR, EST NON AFRICAN AMERICAN: 55 mL/min — AB (ref >60)
Glucose, Bld: 84 mg/dL (ref 65–99)
POTASSIUM: 4.1 mmol/L (ref 3.5–5.1)
Sodium: 137 mmol/L (ref 135–145)
TOTAL PROTEIN: 6.5 g/dL (ref 6.5–8.1)

## 2015-07-04 LAB — SURGICAL PCR SCREEN
MRSA, PCR: NEGATIVE
Staphylococcus aureus: NEGATIVE

## 2015-07-04 LAB — APTT: aPTT: 56 seconds — ABNORMAL HIGH (ref 24–37)

## 2015-07-04 NOTE — Progress Notes (Signed)
PCP is Dr Beatrice Lecher Cardiologist is Dr Lamar Blinks Pulmonologist  Dr. Melvyn Novas Pt states she was instructed to stop Coumadin 07-07-15 Request sent for EKG tracing from Dr Shearon Stalls office Reports she had a heart cath in [place] many years ago 2002 and 2004 Echo noted in epic from 05-16-14 Stress test noted in care everywhere from 2014

## 2015-07-04 NOTE — Pre-Procedure Instructions (Signed)
Terri Small  07/04/2015      [id number] - [place], [place] [address] [place] [address] Phone: [phone] Fax: [phone]  [place], [place] - [address] [place] [address] Phone: [phone] Fax: [phone]    Your procedure is scheduled on Sept 22  Report to [facility] Admitting at 1200 noon  Call this number if you have problems the morning of surgery:  [phone]   Remember:  Do not eat food or drink liquids after midnight.  Take these medicines the morning of surgery with A SIP OF WATER diltiazem (cardizem), metoprolol succinate (Toprol-XL)  Stop taking aspirin, Ibuprofen, Aleve, BC's, Goody's, herbal medications, Fish Oil Stop Coumadin as directed by your Dr.  Lazaro Arms not wear jewelry, make-up or nail polish.  Do not wear lotions, powders, or perfumes.  You may wear deodorant.  Do not shave 48 hours prior to surgery.  Men may shave face and neck.  Do not bring valuables to the hospital.  [facility] is not responsible for any belongings or valuables.  Contacts, dentures or bridgework may not be worn into surgery.  Leave your suitcase in the car.  After surgery it may be brought to your room.  For patients admitted to the hospital, discharge time will be determined by your treatment team.  Patients discharged the day of surgery will not be allowed to drive home.    Special instructions: [facility] - Preparing for Surgery  Before surgery, you can play an important role.  Because skin is not sterile, your skin needs to be as free of germs as possible.  You can reduce the number of germs on you skin by washing with CHG (chlorahexidine gluconate) soap before surgery.  CHG is an antiseptic cleaner which kills germs and bonds with the skin to continue killing germs even after washing.  Please DO NOT use if you have an allergy to  CHG or antibacterial soaps.  If your skin becomes reddened/irritated stop using the CHG and inform your nurse when you arrive at Short Stay.  Do not shave (including legs and underarms) for at least 48 hours prior to the first CHG shower.  You may shave your face.  Please follow these instructions carefully:   1.  Shower with CHG Soap the night before surgery and the   morning of Surgery.  2.  If you choose to wash your hair, wash your hair first as usual with your  normal shampoo.  3.  After you shampoo, rinse your hair and body thoroughly to remove the  Shampoo.  4.  Use CHG as you would any other liquid soap.  You can apply chg directly  to the skin and wash gently with scrungie or a clean washcloth.  5.  Apply the CHG Soap to your body ONLY FROM THE NECK DOWN.   Do not use on open wounds or open sores.  Avoid contact with your eyes,   ears, mouth and genitals (private parts).  Wash genitals (private parts)   with your normal soap.  6.  Wash thoroughly, paying special attention to the area where your surgery  will be performed.  7.  Thoroughly rinse your body with warm water from the neck down.  8.  DO NOT shower/wash with your normal soap after using and rinsing off the CHG Soap.  9.  Pat yourself  dry with a clean towel.            10.  Wear clean pajamas.            11.  Place clean sheets on your bed the night of your first shower and do not        sleep with pets.  Day of Surgery  Do not apply any lotions/deoderants the morning of surgery.  Please wear clean clothes to the hospital/surgery center.     Please read over the following fact sheets that you were given. Pain Booklet, Coughing and Deep Breathing, Blood Transfusion Information, MRSA Information and Surgical Site Infection Prevention

## 2015-07-07 NOTE — Progress Notes (Signed)
Anesthesia Chart Review:  Pt is 81 year old female scheduled for L total shoulder arthroplasty on 07/10/2015 with Dr. Tamera Punt.   PCP is Dr. Beatrice Lecher. Cardiologist is Dr. Lamar Blinks, last office visit 06/24/15; f/u visit and f/u echo scheduled for 08/2015. Pulmonologist is Dr. Melvyn Novas.    PMH includes: chronic atrial fibrillation (s/p ablation x2), CHF, COPD, GERD. Former smoker. BMI 28.   Medications include: diltiazem, lasix, metoprolol, coumadin. Pt to stop coumadin 07/07/15.   Preoperative labs reviewed.  PT 25.9, PTT 56. These will be repeated DOS after pt has been off coumadin.   Chest x-ray 07/03/2015 reviewed. Basal pleural-parenchymal thickening consistent with scarring. No acute cardiopulmonary disease. Stable cardiomegaly.  EKG 06/24/2015: atrial fibrillation. 80 bpm. Nonspecific QRS widening and R axis- consider RVH. Poor R wave progression.   Echo 05/16/2014:  -LV normal in size. Normal LV wall thickness. There is mild diffuse hypokinesis of the LV. EF mildly reduced (45-50%). LV diastolic function is abnormal.  -LA and RA are severely dilated.  -Moderate (2+) mitral regurgitation -Moderately severe (3+) tricuspid regurgitation -RV systolic pressure is elevated between 40-67mm Hg, consistent with moderate pulmonary hypertension -Mild aortic sclerosis is present with good valvular opening.   Nuclear stress test 09/26/2013: -There is a mild, small anterior apical and midanterior defect that is fixed and consistent with breast attenuation. No reversible perfusion defects are identified.  Pt has cardiac clearance for surgery from Dr. Mauricio Po in care everywhere note dated 06/24/15. Pt has pulmonary clearance from Dr. Melvyn Novas in [place] note dated 07/04/15.   Reviewed case with Dr. Ermalene Postin.   If no changes, I anticipate pt can proceed with surgery as scheduled.   Willeen Cass, FNP-BC [facility]/Anesthesiology Phone: [phone] 07/07/2015 1:44 PM

## 2015-07-10 ENCOUNTER — Encounter (HOSPITAL_COMMUNITY)
Admission: RE | Disposition: A | Discharge status: Home / Self Care | Payer: Self-pay | Source: Ambulatory Visit | Attending: Orthopedic Surgery

## 2015-07-10 ENCOUNTER — Encounter (HOSPITAL_COMMUNITY): Payer: Self-pay | Admitting: Anesthesiology

## 2015-07-10 ENCOUNTER — Inpatient Hospital Stay (HOSPITAL_COMMUNITY): Payer: Medicare Other | Admitting: Emergency Medicine

## 2015-07-10 ENCOUNTER — Inpatient Hospital Stay (HOSPITAL_COMMUNITY): Payer: Medicare Other | Admitting: Anesthesiology

## 2015-07-10 ENCOUNTER — Inpatient Hospital Stay (HOSPITAL_COMMUNITY)
Admission: RE | Admit: 2015-07-10 | Discharge: 2015-07-12 | DRG: 483 | Disposition: A | Discharge status: Home / Self Care | Payer: Medicare Other | Source: Ambulatory Visit | Attending: Orthopedic Surgery | Admitting: Orthopedic Surgery

## 2015-07-10 ENCOUNTER — Inpatient Hospital Stay (HOSPITAL_COMMUNITY): Payer: Medicare Other

## 2015-07-10 DIAGNOSIS — Z79899 Other long term (current) drug therapy: Secondary | ICD-10-CM

## 2015-07-10 DIAGNOSIS — Z882 Allergy status to sulfonamides status: Secondary | ICD-10-CM | POA: Diagnosis not present

## 2015-07-10 DIAGNOSIS — I959 Hypotension, unspecified: Secondary | ICD-10-CM | POA: Diagnosis not present

## 2015-07-10 DIAGNOSIS — I10 Essential (primary) hypertension: Secondary | ICD-10-CM | POA: Diagnosis present

## 2015-07-10 DIAGNOSIS — Z96612 Presence of left artificial shoulder joint: Secondary | ICD-10-CM

## 2015-07-10 DIAGNOSIS — I429 Cardiomyopathy, unspecified: Secondary | ICD-10-CM

## 2015-07-10 DIAGNOSIS — I4891 Unspecified atrial fibrillation: Secondary | ICD-10-CM | POA: Diagnosis present

## 2015-07-10 DIAGNOSIS — I482 Chronic atrial fibrillation: Secondary | ICD-10-CM | POA: Diagnosis present

## 2015-07-10 DIAGNOSIS — Z7901 Long term (current) use of anticoagulants: Secondary | ICD-10-CM | POA: Diagnosis not present

## 2015-07-10 DIAGNOSIS — I5032 Chronic diastolic (congestive) heart failure: Secondary | ICD-10-CM | POA: Diagnosis present

## 2015-07-10 DIAGNOSIS — Z87891 Personal history of nicotine dependence: Secondary | ICD-10-CM | POA: Diagnosis not present

## 2015-07-10 DIAGNOSIS — M19019 Primary osteoarthritis, unspecified shoulder: Secondary | ICD-10-CM | POA: Diagnosis present

## 2015-07-10 DIAGNOSIS — I952 Hypotension due to drugs: Secondary | ICD-10-CM | POA: Diagnosis not present

## 2015-07-10 DIAGNOSIS — E861 Hypovolemia: Secondary | ICD-10-CM | POA: Diagnosis not present

## 2015-07-10 DIAGNOSIS — M19012 Primary osteoarthritis, left shoulder: Principal | ICD-10-CM | POA: Diagnosis present

## 2015-07-10 DIAGNOSIS — J449 Chronic obstructive pulmonary disease, unspecified: Secondary | ICD-10-CM | POA: Diagnosis present

## 2015-07-10 DIAGNOSIS — I272 Other secondary pulmonary hypertension: Secondary | ICD-10-CM | POA: Diagnosis present

## 2015-07-10 DIAGNOSIS — Z888 Allergy status to other drugs, medicaments and biological substances status: Secondary | ICD-10-CM

## 2015-07-10 DIAGNOSIS — M25512 Pain in left shoulder: Secondary | ICD-10-CM | POA: Diagnosis present

## 2015-07-10 DIAGNOSIS — I5030 Unspecified diastolic (congestive) heart failure: Secondary | ICD-10-CM | POA: Diagnosis present

## 2015-07-10 DIAGNOSIS — I071 Rheumatic tricuspid insufficiency: Secondary | ICD-10-CM | POA: Diagnosis present

## 2015-07-10 DIAGNOSIS — K219 Gastro-esophageal reflux disease without esophagitis: Secondary | ICD-10-CM | POA: Diagnosis present

## 2015-07-10 DIAGNOSIS — M810 Age-related osteoporosis without current pathological fracture: Secondary | ICD-10-CM | POA: Diagnosis present

## 2015-07-10 DIAGNOSIS — I9581 Postprocedural hypotension: Secondary | ICD-10-CM | POA: Diagnosis not present

## 2015-07-10 DIAGNOSIS — I9589 Other hypotension: Secondary | ICD-10-CM | POA: Diagnosis not present

## 2015-07-10 HISTORY — PX: TOTAL SHOULDER ARTHROPLASTY: SHX126

## 2015-07-10 LAB — PROTIME-INR
INR: 1.32 (ref 0.00–1.49)
PROTHROMBIN TIME: 16.5 s — AB (ref 11.6–15.2)

## 2015-07-10 LAB — APTT: APTT: 38 s — AB (ref 24–37)

## 2015-07-10 SURGERY — ARTHROPLASTY, SHOULDER, TOTAL
Anesthesia: General | Site: Shoulder | Laterality: Left

## 2015-07-10 MED ORDER — ONDANSETRON HCL 4 MG/2ML IJ SOLN: 4.0000 mg | Freq: Four times a day (QID) | INTRAMUSCULAR | Status: DC | PRN

## 2015-07-10 MED ORDER — OXYCODONE HCL 5 MG/5ML PO SOLN: 5.0000 mg | Freq: Once | ORAL | Status: DC | PRN

## 2015-07-10 MED ORDER — ALUM & MAG HYDROXIDE-SIMETH 200-200-20 MG/5ML PO SUSP: 30.0000 mL | ORAL | Status: DC | PRN

## 2015-07-10 MED ORDER — ROCURONIUM BROMIDE 50 MG/5ML IV SOLN
INTRAVENOUS | Status: AC
  Filled 2015-07-10: qty 1

## 2015-07-10 MED ORDER — MIDAZOLAM HCL 2 MG/2ML IJ SOLN
INTRAMUSCULAR | Status: AC
  Administered 2015-07-10: 1 mg
  Filled 2015-07-10: qty 2

## 2015-07-10 MED ORDER — FLEET ENEMA 7-19 GM/118ML RE ENEM: 1.0000 | ENEMA | Freq: Once | RECTAL | Status: DC | PRN

## 2015-07-10 MED ORDER — PROPOFOL 10 MG/ML IV BOLUS
INTRAVENOUS | Status: AC
  Filled 2015-07-10: qty 20

## 2015-07-10 MED ORDER — ONDANSETRON HCL 4 MG PO TABS: 4.0000 mg | ORAL_TABLET | Freq: Four times a day (QID) | ORAL | Status: DC | PRN

## 2015-07-10 MED ORDER — LACTATED RINGERS IV SOLN
INTRAVENOUS | Status: DC
  Administered 2015-07-10 (×2): via INTRAVENOUS

## 2015-07-10 MED ORDER — CEFAZOLIN SODIUM-DEXTROSE 2-3 GM-% IV SOLR
2.0000 g | INTRAVENOUS | Status: AC
  Administered 2015-07-10: 2 g via INTRAVENOUS

## 2015-07-10 MED ORDER — PROPOFOL 10 MG/ML IV BOLUS
INTRAVENOUS | Status: DC | PRN
  Administered 2015-07-10: 150 mg via INTRAVENOUS

## 2015-07-10 MED ORDER — SODIUM CHLORIDE 0.9 % IJ SOLN
INTRAMUSCULAR | Status: AC
  Filled 2015-07-10: qty 10

## 2015-07-10 MED ORDER — GLYCOPYRROLATE 0.2 MG/ML IJ SOLN
INTRAMUSCULAR | Status: DC | PRN
  Administered 2015-07-10: .6 mg via INTRAVENOUS

## 2015-07-10 MED ORDER — CEFAZOLIN SODIUM-DEXTROSE 2-3 GM-% IV SOLR
INTRAVENOUS | Status: AC
  Filled 2015-07-10: qty 50

## 2015-07-10 MED ORDER — ROCURONIUM BROMIDE 100 MG/10ML IV SOLN
INTRAVENOUS | Status: DC | PRN
  Administered 2015-07-10: 30 mg via INTRAVENOUS

## 2015-07-10 MED ORDER — WARFARIN SODIUM 5 MG PO TABS
5.0000 mg | ORAL_TABLET | Freq: Every day | ORAL | Status: DC
  Administered 2015-07-10 – 2015-07-11 (×2): 5 mg via ORAL
  Filled 2015-07-10 (×2): qty 1

## 2015-07-10 MED ORDER — LIDOCAINE HCL (CARDIAC) 20 MG/ML IV SOLN
INTRAVENOUS | Status: DC | PRN
  Administered 2015-07-10: 60 mg via INTRAVENOUS

## 2015-07-10 MED ORDER — ONDANSETRON HCL 4 MG/2ML IJ SOLN: 4.0000 mg | Freq: Once | INTRAMUSCULAR | Status: DC | PRN

## 2015-07-10 MED ORDER — WARFARIN - PHYSICIAN DOSING INPATIENT
Freq: Every day | Status: DC
  Administered 2015-07-11: 18:00:00

## 2015-07-10 MED ORDER — ACETAMINOPHEN 500 MG PO TABS
1000.0000 mg | ORAL_TABLET | Freq: Four times a day (QID) | ORAL | Status: AC
  Administered 2015-07-10 – 2015-07-11 (×3): 1000 mg via ORAL
  Filled 2015-07-10 (×3): qty 2

## 2015-07-10 MED ORDER — CEFAZOLIN SODIUM 1-5 GM-% IV SOLN
1.0000 g | Freq: Four times a day (QID) | INTRAVENOUS | Status: AC
  Administered 2015-07-10 – 2015-07-11 (×3): 1 g via INTRAVENOUS
  Filled 2015-07-10 (×3): qty 50

## 2015-07-10 MED ORDER — SODIUM CHLORIDE 0.9 % IV SOLN
INTRAVENOUS | Status: DC
  Administered 2015-07-10 – 2015-07-11 (×2): via INTRAVENOUS

## 2015-07-10 MED ORDER — VECURONIUM BROMIDE 10 MG IV SOLR
INTRAVENOUS | Status: AC
  Filled 2015-07-10: qty 10

## 2015-07-10 MED ORDER — FENTANYL CITRATE (PF) 100 MCG/2ML IJ SOLN: 25.0000 ug | INTRAMUSCULAR | Status: DC | PRN

## 2015-07-10 MED ORDER — LIDOCAINE HCL (CARDIAC) 20 MG/ML IV SOLN
INTRAVENOUS | Status: AC
  Filled 2015-07-10: qty 5

## 2015-07-10 MED ORDER — POVIDONE-IODINE 7.5 % EX SOLN
Freq: Once | CUTANEOUS | Status: DC
  Filled 2015-07-10: qty 118

## 2015-07-10 MED ORDER — MENTHOL 3 MG MT LOZG: 1.0000 | LOZENGE | OROMUCOSAL | Status: DC | PRN

## 2015-07-10 MED ORDER — METOPROLOL SUCCINATE ER 50 MG PO TB24
50.0000 mg | ORAL_TABLET | Freq: Every day | ORAL | Status: DC
  Administered 2015-07-10: 50 mg via ORAL
  Filled 2015-07-10: qty 1

## 2015-07-10 MED ORDER — METOCLOPRAMIDE HCL 5 MG/ML IJ SOLN: 5.0000 mg | Freq: Three times a day (TID) | INTRAMUSCULAR | Status: DC | PRN

## 2015-07-10 MED ORDER — DOCUSATE SODIUM 100 MG PO CAPS
100.0000 mg | ORAL_CAPSULE | Freq: Two times a day (BID) | ORAL | Status: DC
  Administered 2015-07-10 – 2015-07-12 (×3): 100 mg via ORAL
  Filled 2015-07-10 (×3): qty 1

## 2015-07-10 MED ORDER — ZOLPIDEM TARTRATE 5 MG PO TABS: 5.0000 mg | ORAL_TABLET | Freq: Every evening | ORAL | Status: DC | PRN

## 2015-07-10 MED ORDER — SODIUM CHLORIDE 0.9 % IR SOLN
Status: DC | PRN
  Administered 2015-07-10: 3000 mL

## 2015-07-10 MED ORDER — 0.9 % SODIUM CHLORIDE (POUR BTL) OPTIME
TOPICAL | Status: DC | PRN
  Administered 2015-07-10: 1000 mL

## 2015-07-10 MED ORDER — DEXTROSE 5 % IV SOLN
10.0000 mg | INTRAVENOUS | Status: DC | PRN
  Administered 2015-07-10: 20 ug/min via INTRAVENOUS

## 2015-07-10 MED ORDER — SUCCINYLCHOLINE CHLORIDE 20 MG/ML IJ SOLN
INTRAMUSCULAR | Status: AC
  Filled 2015-07-10: qty 1

## 2015-07-10 MED ORDER — FUROSEMIDE 20 MG PO TABS
20.0000 mg | ORAL_TABLET | Freq: Every day | ORAL | Status: DC
  Administered 2015-07-12: 20 mg via ORAL
  Filled 2015-07-10 (×2): qty 1

## 2015-07-10 MED ORDER — DILTIAZEM HCL ER COATED BEADS 240 MG PO CP24
240.0000 mg | ORAL_CAPSULE | Freq: Every day | ORAL | Status: DC
  Administered 2015-07-10: 240 mg via ORAL
  Filled 2015-07-10 (×2): qty 1

## 2015-07-10 MED ORDER — METOCLOPRAMIDE HCL 5 MG PO TABS: 5.0000 mg | ORAL_TABLET | Freq: Three times a day (TID) | ORAL | Status: DC | PRN

## 2015-07-10 MED ORDER — POLYETHYLENE GLYCOL 3350 17 G PO PACK: 17.0000 g | PACK | Freq: Every day | ORAL | Status: DC | PRN

## 2015-07-10 MED ORDER — GLYCOPYRROLATE 0.2 MG/ML IJ SOLN
INTRAMUSCULAR | Status: AC
  Filled 2015-07-10: qty 1

## 2015-07-10 MED ORDER — ACETAMINOPHEN 325 MG PO TABS
650.0000 mg | ORAL_TABLET | Freq: Four times a day (QID) | ORAL | Status: DC | PRN
  Administered 2015-07-12: 650 mg via ORAL
  Filled 2015-07-10: qty 2

## 2015-07-10 MED ORDER — FENTANYL CITRATE (PF) 100 MCG/2ML IJ SOLN
INTRAMUSCULAR | Status: AC
  Administered 2015-07-10: 50 ug
  Filled 2015-07-10: qty 2

## 2015-07-10 MED ORDER — MORPHINE SULFATE (PF) 2 MG/ML IV SOLN
1.0000 mg | INTRAVENOUS | Status: DC | PRN
  Filled 2015-07-10: qty 1

## 2015-07-10 MED ORDER — ONDANSETRON HCL 4 MG/2ML IJ SOLN
INTRAMUSCULAR | Status: DC | PRN
  Administered 2015-07-10: 4 mg via INTRAVENOUS

## 2015-07-10 MED ORDER — DIPHENHYDRAMINE HCL 12.5 MG/5ML PO ELIX: 12.5000 mg | ORAL_SOLUTION | ORAL | Status: DC | PRN

## 2015-07-10 MED ORDER — OXYCODONE HCL 5 MG PO TABS
5.0000 mg | ORAL_TABLET | ORAL | Status: DC | PRN
  Administered 2015-07-11 (×2): 5 mg via ORAL
  Filled 2015-07-10: qty 1
  Filled 2015-07-10: qty 2
  Filled 2015-07-10 (×2): qty 1

## 2015-07-10 MED ORDER — HEMOSTATIC AGENTS (NO CHARGE) OPTIME
TOPICAL | Status: DC | PRN
  Administered 2015-07-10: 1 via TOPICAL

## 2015-07-10 MED ORDER — BISACODYL 5 MG PO TBEC: 5.0000 mg | DELAYED_RELEASE_TABLET | Freq: Every day | ORAL | Status: DC | PRN

## 2015-07-10 MED ORDER — ASPIRIN EC 325 MG PO TBEC
325.0000 mg | DELAYED_RELEASE_TABLET | Freq: Two times a day (BID) | ORAL | Status: DC
  Filled 2015-07-10 (×3): qty 1

## 2015-07-10 MED ORDER — STERILE WATER FOR INJECTION IJ SOLN
INTRAMUSCULAR | Status: AC
  Filled 2015-07-10: qty 10

## 2015-07-10 MED ORDER — NEOSTIGMINE METHYLSULFATE 10 MG/10ML IV SOLN
INTRAVENOUS | Status: DC | PRN
  Administered 2015-07-10: 3 mg via INTRAVENOUS

## 2015-07-10 MED ORDER — OXYCODONE HCL 5 MG PO TABS: 5.0000 mg | ORAL_TABLET | Freq: Once | ORAL | Status: DC | PRN

## 2015-07-10 MED ORDER — ACETAMINOPHEN 650 MG RE SUPP: 650.0000 mg | Freq: Four times a day (QID) | RECTAL | Status: DC | PRN

## 2015-07-10 MED ORDER — PHENOL 1.4 % MT LIQD: 1.0000 | OROMUCOSAL | Status: DC | PRN

## 2015-07-10 MED ORDER — FENTANYL CITRATE (PF) 250 MCG/5ML IJ SOLN
INTRAMUSCULAR | Status: AC
Start: 2015-07-10 — End: 2015-07-10
  Filled 2015-07-10: qty 5

## 2015-07-10 MED ORDER — EPHEDRINE SULFATE 50 MG/ML IJ SOLN
INTRAMUSCULAR | Status: AC
  Filled 2015-07-10: qty 1

## 2015-07-10 MED ORDER — TRIMETHOPRIM 100 MG PO TABS
100.0000 mg | ORAL_TABLET | Freq: Every day | ORAL | Status: DC
  Administered 2015-07-11 – 2015-07-12 (×2): 100 mg via ORAL
  Filled 2015-07-10 (×2): qty 1

## 2015-07-10 SURGICAL SUPPLY — 69 items
BIT DRILL 5/64X5 DISP (BIT) ×2 IMPLANT
BLADE SAW SAG 73X25 THK (BLADE) ×1
BLADE SAW SGTL 73X25 THK (BLADE) ×1 IMPLANT
BLADE SURG 15 STRL LF DISP TIS (BLADE) ×1 IMPLANT
BLADE SURG 15 STRL SS (BLADE) ×1
CAP SHOULDER TOTAL 2 ×2 IMPLANT
CEMENT BONE DEPUY (Cement) ×2 IMPLANT
CHLORAPREP W/TINT 26ML (MISCELLANEOUS) ×4 IMPLANT
CLSR STERI-STRIP ANTIMIC 1/2X4 (GAUZE/BANDAGES/DRESSINGS) ×2 IMPLANT
COVER MAYO STAND STRL (DRAPES) IMPLANT
COVER SURGICAL LIGHT HANDLE (MISCELLANEOUS) ×2 IMPLANT
DRAPE IMP U-DRAPE 54X76 (DRAPES) ×2 IMPLANT
DRAPE INCISE IOBAN 66X45 STRL (DRAPES) ×4 IMPLANT
DRAPE ORTHO SPLIT 77X108 STRL (DRAPES) ×2
DRAPE SURG 17X23 STRL (DRAPES) ×2 IMPLANT
DRAPE SURG ORHT 6 SPLT 77X108 (DRAPES) ×2 IMPLANT
DRAPE U-SHAPE 47X51 STRL (DRAPES) ×2 IMPLANT
DRSG AQUACEL AG ADV 3.5X10 (GAUZE/BANDAGES/DRESSINGS) ×2 IMPLANT
ELECT BLADE 4.0 EZ CLEAN MEGAD (MISCELLANEOUS)
ELECT REM PT RETURN 9FT ADLT (ELECTROSURGICAL) ×2
ELECTRODE BLDE 4.0 EZ CLN MEGD (MISCELLANEOUS) IMPLANT
ELECTRODE REM PT RTRN 9FT ADLT (ELECTROSURGICAL) ×1 IMPLANT
EVACUATOR 1/8 PVC DRAIN (DRAIN) IMPLANT
GLENOID CORTILOC AUGMENT LT-15 (Shoulder) ×2 IMPLANT
GLOVE BIO SURGEON STRL SZ7 (GLOVE) ×2 IMPLANT
GLOVE BIO SURGEON STRL SZ7.5 (GLOVE) ×2 IMPLANT
GLOVE BIOGEL PI IND STRL 7.0 (GLOVE) ×1 IMPLANT
GLOVE BIOGEL PI IND STRL 8 (GLOVE) ×1 IMPLANT
GLOVE BIOGEL PI INDICATOR 7.0 (GLOVE) ×1
GLOVE BIOGEL PI INDICATOR 8 (GLOVE) ×1
GOWN STRL REUS W/ TWL LRG LVL3 (GOWN DISPOSABLE) ×1 IMPLANT
GOWN STRL REUS W/ TWL XL LVL3 (GOWN DISPOSABLE) ×1 IMPLANT
GOWN STRL REUS W/TWL LRG LVL3 (GOWN DISPOSABLE) ×1
GOWN STRL REUS W/TWL XL LVL3 (GOWN DISPOSABLE) ×1
GUIDE WIRE 2.5X220MM ×2 IMPLANT
HANDPIECE INTERPULSE COAX TIP (DISPOSABLE) ×1
HEMOSTAT SURGICEL 2X14 (HEMOSTASIS) ×2 IMPLANT
HOOD PEEL AWAY FACE SHEILD DIS (HOOD) ×4 IMPLANT
KIT BASIN OR (CUSTOM PROCEDURE TRAY) ×2 IMPLANT
KIT ROOM TURNOVER OR (KITS) ×2 IMPLANT
MANIFOLD NEPTUNE II (INSTRUMENTS) ×2 IMPLANT
NEEDLE HYPO 25GX1X1/2 BEV (NEEDLE) IMPLANT
NEEDLE MAYO TROCAR (NEEDLE) ×2 IMPLANT
NS IRRIG 1000ML POUR BTL (IV SOLUTION) ×2 IMPLANT
PACK SHOULDER (CUSTOM PROCEDURE TRAY) ×2 IMPLANT
PAD ARMBOARD 7.5X6 YLW CONV (MISCELLANEOUS) ×4 IMPLANT
RETRIEVER SUT HEWSON (MISCELLANEOUS) ×2 IMPLANT
SET HNDPC FAN SPRY TIP SCT (DISPOSABLE) ×1 IMPLANT
SLING ARM IMMOBILIZER LRG (SOFTGOODS) ×2 IMPLANT
SLING ARM IMMOBILIZER MED (SOFTGOODS) ×2 IMPLANT
SMARTMIX MINI TOWER (MISCELLANEOUS) ×2
SPONGE LAP 18X18 X RAY DECT (DISPOSABLE) ×2 IMPLANT
SPONGE LAP 4X18 X RAY DECT (DISPOSABLE) IMPLANT
STRIP CLOSURE SKIN 1/2X4 (GAUZE/BANDAGES/DRESSINGS) ×2 IMPLANT
SUCTION FRAZIER TIP 10 FR DISP (SUCTIONS) ×2 IMPLANT
SUPPORT WRAP ARM LG (MISCELLANEOUS) ×2 IMPLANT
SUT ETHIBOND NAB CT1 #1 30IN (SUTURE) ×2 IMPLANT
SUT MNCRL AB 4-0 PS2 18 (SUTURE) ×2 IMPLANT
SUT SILK 2 0 TIES 17X18 (SUTURE)
SUT SILK 2-0 18XBRD TIE BLK (SUTURE) IMPLANT
SUT VIC AB 0 CTB1 27 (SUTURE) IMPLANT
SUT VIC AB 2-0 CT1 27 (SUTURE) ×1
SUT VIC AB 2-0 CT1 TAPERPNT 27 (SUTURE) ×1 IMPLANT
SYR CONTROL 10ML LL (SYRINGE) IMPLANT
TAPE FIBER 2MM 7IN #2 BLUE (SUTURE) IMPLANT
TAPE LABRALWHITE 1.5X36 (TAPE) ×4 IMPLANT
TOWEL OR 17X24 6PK STRL BLUE (TOWEL DISPOSABLE) ×2 IMPLANT
TOWEL OR 17X26 10 PK STRL BLUE (TOWEL DISPOSABLE) ×2 IMPLANT
TOWER SMARTMIX MINI (MISCELLANEOUS) ×1 IMPLANT

## 2015-07-10 NOTE — H&P (Signed)
Terri Small is an 81 y.o. female.    Chief Complaint: Left shoulder pain and dysfunction  HPI: 81 year old female with endstage osteoarthritis who has failed conservative management with medications, activity modifications, physical therapy. Severe pain interferes with quality of life and sleep.  Past Medical History  Diagnosis Date  . 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  . Osteoporosis   . Herpes zoster   . Fracture, fibula 11/26/2013  . Fracture, sternum closed 11/26/13  . Fracture of lateral malleolus 11/17/2013  . Dysrhythmia   . CHF (congestive heart failure)   . COPD (chronic obstructive pulmonary disease)   . Shortness of breath dyspnea   . GERD (gastroesophageal reflux disease)   . Diverticulitis   . Headache   . Arthritis     Past Surgical History  Procedure Laterality Date  . Abdominal hysterectomy      Vaginal  . Cholecystectomy      Laparoscopic  . Cardiac catheterization      W/ Ablation  . Colonoscopy      Family History  Problem Relation Age of Onset  . Stroke Father   . Atrial fibrillation    . Emphysema Father    Social History:  reports that she quit smoking about 54 years ago. Her smoking use included Cigarettes. She has a 6 pack-year smoking history. She has never used smokeless tobacco. She reports that she drinks about 1.0 oz of alcohol per week. She reports that she does not use illicit drugs.  Allergies:  Allergies  Allergen Reactions  . Nexium [Esomeprazole Magnesium] Other (See Comments)    Chest heaviness.  . Sulfonamide Derivatives Other (See Comments)    REACTION: rash- burning on face    Medications Prior to Admission  Medication Sig Dispense Refill  . diltiazem (CARDIZEM CD) 240 MG 24 hr capsule Take 1 capsule (240 mg total) by mouth daily. 90 capsule 1  . estradiol (ESTRACE) 0.1 MG/GM vaginal cream Place 1 Applicatorful vaginally every other day.     . furosemide  (LASIX) 20 MG tablet TAKE 1 TABLET BY MOUTH DAILY (Patient taking differently: Take 20 mg by mouth once daily) 30 tablet 2  . glucosamine-chondroitin 500-400 MG tablet Take 1 tablet by mouth daily.     . metoprolol succinate (TOPROL-XL) 50 MG 24 hr tablet Take 50 mg by mouth daily.    . Multiple Vitamins-Minerals (PRESERVISION AREDS 2 PO) Take 1 tablet by mouth 2 (two) times daily.    Marland Kitchen trimethoprim (TRIMPEX) 100 MG tablet Take 1 tablet (100 mg total) by mouth daily. 90 tablet 0  . warfarin (COUMADIN) 5 MG tablet Take 1 tablet (5 mg total) by mouth daily. (Patient taking differently: Take 2.5-5 mg by mouth daily. Take 2.5mg  by mouth on Thursday and take 5 mg by mouth on all other days) 90 tablet 1    No results found for this or any previous visit (from the past 48 hour(s)). No results found.  Review of Systems  All other systems reviewed and are negative.   Blood pressure 132/75, pulse 82, temperature 97.6 F (36.4 C), temperature source Oral, resp. rate 20, weight 72.122 kg (159 lb), SpO2 98 %. Physical Exam  Constitutional: She is oriented to person, place, and time. She appears well-developed and well-nourished.  HENT:  Head: Atraumatic.  Eyes: EOM are normal.  Cardiovascular: Intact distal pulses.   Respiratory: Effort normal.  Musculoskeletal:  Left shoulder pain with  limited range of motion  Neurological: She is alert and oriented to person, place, and time.  Skin: Skin is warm and dry.  Psychiatric: She has a normal mood and affect.     Assessment/Plan Left shoulder endstage osteoarthritis with a B2 glenoid Plan left total shoulder replacement with augmented glenoid component. Risks / benefits of surgery discussed Consent on chart  NPO for OR Preop antibiotics   CHANDLER,JUSTIN WILLIAM 07/10/2015, 1:42 PM

## 2015-07-10 NOTE — Anesthesia Preprocedure Evaluation (Addendum)
Anesthesia Evaluation  Patient identified by MRN, date of birth, ID band Patient awake    Reviewed: Allergy & Precautions, NPO status , Patient's Chart, lab work & pertinent test results, reviewed documented beta blocker date and time   Airway Mallampati: II  TM Distance: >3 FB Neck ROM: Full    Dental  (+) Teeth Intact, Dental Advisory Given   Pulmonary COPD, former smoker (quit 1962),    breath sounds clear to auscultation       Cardiovascular hypertension, Pt. on medications and Pt. on home beta blockers + dysrhythmias Atrial Fibrillation  Rhythm:Irregular Rate:Normal  '15 ECHO; EF 45-50%, mild-mod MR, mod-severe TR   Neuro/Psych    GI/Hepatic   Endo/Other    Renal/GU      Musculoskeletal  (+) Arthritis , Osteoarthritis,    Abdominal   Peds  Hematology  (+) Blood dyscrasia (Coumadin), ,   Anesthesia Other Findings   Reproductive/Obstetrics                          Anesthesia Physical Anesthesia Plan  ASA: III  Anesthesia Plan: General   Post-op Pain Management: GA combined w/ Regional for post-op pain   Induction: Intravenous  Airway Management Planned: Oral ETT  Additional Equipment:   Intra-op Plan:   Post-operative Plan:   Informed Consent: I have reviewed the patients History and Physical, chart, labs and discussed the procedure including the risks, benefits and alternatives for the proposed anesthesia with the patient or authorized representative who has indicated his/her understanding and acceptance.   Dental advisory given  Plan Discussed with: CRNA and Anesthesiologist  Anesthesia Plan Comments:         Anesthesia Quick Evaluation

## 2015-07-10 NOTE — Op Note (Signed)
Procedure(s): LEFT TOTAL SHOULDER ARTHROPLASTY Procedure Note  Terri Small female 81 y.o. 07/10/2015  Procedure(s) and Anesthesia Type:    * LEFT TOTAL SHOULDER ARTHROPLASTY with augmented glenoid component for B2 glenoid - General  Surgeon(s) and Role:    * Tania Ade, MD - Primary   Indications:  81 y.o. female  With endstage left shoulder arthritis. Pain and dysfunction interfered with quality of life and nonoperative treatment with activity modification, NSAIDS and injections failed.     Surgeon: Nita Sells   Assistants: Jeanmarie Hubert PA-C [facility] was present and scrubbed throughout the procedure and was essential in positioning, retraction, exposure, and closure)  Anesthesia: General endotracheal anesthesia with preoperative interscalene block given by the attending anesthesiologist     Procedure Detail  LEFT TOTAL SHOULDER ARTHROPLASTY  Findings: Tornier flex anatomic press-fit size 4 with a 43 head, cemented size medium perform plus augmented tornier glenoid 15 .    Estimated Blood Loss:  300 mL         Drains: None   Blood Given: none          Specimens: none        Complications:  * No complications entered in OR log *         Disposition: PACU - hemodynamically stable.         Condition: stable    Procedure:   The patient was identified in the preoperative holding area where I personally marked the operative extremity after verifying with the patient and consent. She  was taken to the operating room where She was transferred to the   operative table.  The patient received an interscalene block in   the holding area by the attending anesthesiologist.  General anesthesia was induced   in the operating room without complication.  The patient did receive IV  Ancef prior to the commencement of the procedure.  The patient was   placed in the beach-chair position with the back raised about 30   degrees.  The nonoperative extremity and  head and neck were carefully   positioned and padded protecting against neurovascular compromise.  The   left upper extremity was then prepped and draped in the standard sterile   fashion.    The appropriate operative time-out was performed with   Anesthesia, the perioperative staff, as well as myself and we all agreed   that the left side was the correct operative site.  An approximately   10 cm incision was made from the tip of the coracoid to the center point of the   humerus at the level of the axilla.  Dissection was carried down sharply   through subcutaneous tissues and cephalic vein was identified and taken   laterally with the deltoid.  The pectoralis major was taken medially.  The   upper 1 cm of the pectoralis major was released from its attachment on   the humerus.  The clavipectoral fascia was incised just lateral to the   conjoined tendon.  This incision was carried up to but not into the   coracoacromial ligament.  Digital palpation was used to prove   integrity of the axillary nerve which was protected throughout the   procedure.  Musculocutaneous nerve was not palpated in the operative   field.  Conjoined tendon was then retracted gently medially and the   deltoid laterally.  Anterior circumflex humeral vessels were clamped and   coagulated.  The soft tissues overlying the biceps was incised and this  incision was carried across the transverse humeral ligament to the base   of the coracoid.  The biceps was tenodesed to the soft tissue just above   pectoralis major and the remaining portion of the biceps superiorly was   excised.   the subscapularis was taken down as a peel from the lesser tuberosity with the capsule.  Capsule was then   released all the way down to the 6 o'clock position of the humeral head.   The humeral head was then delivered with simultaneous adduction,   extension and external rotation.  All humeral osteophytes were removed   and the anatomic neck of  the humerus was marked and cut free hand at   approximately 25 degrees retroversion within about 3 mm of the cuff   reflection posteriorly.  The head size was estimated to be a43 medium   offset.  At that point, the humeral head was retracted posteriorly with   a Fukuda retractor.   she was noted to have significant synovial hypertrophy with multiple fronds. Remaining portion of the capsule was released at the base of the   coracoid.  The remaining biceps anchor and the entire anterior-inferior   labrum was excised.  The posterior labrum was also excised but the   posterior capsule was not released.  The guidepin was placed bicortically witthe anterior  Referenced guide.  The reamer was used to ream in the anterior half of the paleo glenoid.  the posterior half of the glenoid was then reamed with the 15 angled reamer. The peripheral holes were then drilled as well as the center hole and none of the holes exited the glenoid vault.   I then pulse irrigated these holes and dried   them with Surgicel.  The three peripheral holes were then   pressurized cemented and the anchor peg glenoid was placed and impacted   with an excellent fit.  The glenoid was a medium 15 posterior augmented component.  The proximal humerus was then again exposed taking care not to displace the glenoid.   there was some compression of the anterior cortex of proximal humerus with exposure of the glenoid. Bone quality was noted to be extremely poor.  The entry awl was used followed by sounding reamers and then sequentially broached from size 1 to 4.  Trial head was placed with a 43.  With trial implantation of the component, there was approximately 50% posterior translation with immediate snap back to the   anatomic position. The trial was removed and the final implant was prepared on a back table.  The trial was removed and the final implant was prepared on a back table.   Small holes were drilled on The medial side of the lesser  tuberosity osteotomy, through which2 labral tapes were passed. The implant was then placed through the loop of the tapes and impacted with  Acceptable press-fit. This achieved excellent anatomic reconstruction of the proximal humerus.  The joint was then copiously irrigated with pulse lavage.  The subscapularis  Was then repaired using the  labral tapes previously passed  using a double row technique utilizing lateral bone tunnels and tying over a bone bridge. Given her very poor bone quality I did use a mini fragment hand plate to tie the sutures over the lateral greater tuberosity to prevent pull-through.  One #1 Ethibond was placed at the rotator interval just above   the lesser tuberosity. Copious irrigation was used. Skin was closed with 2-0 Vicryl sutures in  the deep dermal layer and 4-0 Monocryl in a subcuticular  running fashion.  Sterile dressings were then applied including Aquacel.  The patient was placed in a sling and allowed to awaken from general anesthesia and taken to the recovery room in stable condition.      POSTOPERATIVE PLAN:  Early passive range of motion will be allowed with the goal of  0 degrees external rotation and 90 degrees forward elevation.  No internal rotation at this time.  No active motion of the arm until the subscapularis heals.  The patient will likely be kept in the hospital for 1-2 days and then discharged home.

## 2015-07-10 NOTE — Transfer of Care (Signed)
Immediate Anesthesia Transfer of Care Note  Patient: Terri Small  Procedure(s) Performed: Procedure(s) with comments: LEFT TOTAL SHOULDER ARTHROPLASTY (Left) - Left shoulder arthroplasty  Patient Location: PACU  Anesthesia Type:GA combined with regional for post-op pain  Level of Consciousness: awake, alert  and oriented  Airway & Oxygen Therapy: Patient Spontanous Breathing and Patient connected to nasal cannula oxygen  Post-op Assessment: Report given to RN and Post -op Vital signs reviewed and stable  Post vital signs: Reviewed and stable  Last Vitals:  Filed Vitals:   07/10/15 1728  BP: 115/69  Pulse:   Temp: 36.4 C  Resp:     Complications: No apparent anesthesia complications

## 2015-07-10 NOTE — Anesthesia Procedure Notes (Addendum)
Anesthesia Regional Block:  Interscalene brachial plexus block  Pre-Anesthetic Checklist: ,, timeout performed, Correct Patient, Correct Site, Correct Laterality, Correct Procedure, Correct Position, site marked, Risks and benefits discussed,  Surgical consent,  Pre-op evaluation,  At surgeon's request and post-op pain management  Laterality: Left  Prep: chloraprep       Needles:  Injection technique: Single-shot  Needle Type: Echogenic Stimulator Needle     Needle Length: 9cm 9 cm Needle Gauge: 21 and 21 G    Additional Needles:  Procedures: ultrasound guided (picture in chart) Interscalene brachial plexus block Narrative:  Start time: 07/10/2015 2:10 PM End time: 07/10/2015 2:15 PM Injection made incrementally with aspirations every 5 mL.  Performed by: Personally   Additional Notes: 15 cc 0.5% Bupivacaine with 1:200 epi injected easily   Procedure Name: Intubation Date/Time: 07/10/2015 2:47 PM Performed by: Luciana Axe K Pre-anesthesia Checklist: Patient identified, Emergency Drugs available, Suction available, Patient being monitored and Timeout performed Patient Re-evaluated:Patient Re-evaluated prior to inductionOxygen Delivery Method: Circle system utilized Preoxygenation: Pre-oxygenation with 100% oxygen Intubation Type: IV induction Ventilation: Mask ventilation without difficulty Laryngoscope Size: Miller and 2 Grade View: Grade I Tube type: Oral Tube size: 7.0 mm Number of attempts: 1 Airway Equipment and Method: Stylet Placement Confirmation: ETT inserted through vocal cords under direct vision,  positive ETCO2,  CO2 detector and breath sounds checked- equal and bilateral Secured at: 21 cm Tube secured with: Tape Dental Injury: Teeth and Oropharynx as per pre-operative assessment

## 2015-07-10 NOTE — Anesthesia Postprocedure Evaluation (Signed)
  Anesthesia Post-op Note  Patient: Terri Small  Procedure(s) Performed: Procedure(s) with comments: LEFT TOTAL SHOULDER ARTHROPLASTY (Left) - Left shoulder arthroplasty  Patient Location: PACU  Anesthesia Type:General  Level of Consciousness: awake, alert  and oriented  Airway and Oxygen Therapy: Patient Spontanous Breathing  Post-op Pain: none  Post-op Assessment: Post-op Vital signs reviewed and Patient's Cardiovascular Status Stable              Post-op Vital Signs: Reviewed and stable  Last Vitals:  Filed Vitals:   07/10/15 1745  BP: 93/74  Pulse: 71  Temp:   Resp: 20    Complications: No apparent anesthesia complications

## 2015-07-11 ENCOUNTER — Encounter (HOSPITAL_COMMUNITY): Payer: Self-pay | Admitting: Orthopedic Surgery

## 2015-07-11 DIAGNOSIS — I952 Hypotension due to drugs: Secondary | ICD-10-CM

## 2015-07-11 DIAGNOSIS — I482 Chronic atrial fibrillation: Secondary | ICD-10-CM

## 2015-07-11 LAB — CBC
HCT: 30 % — ABNORMAL LOW (ref 36.0–46.0)
HEMATOCRIT: 32.4 % — AB (ref 36.0–46.0)
HEMOGLOBIN: 10.9 g/dL — AB (ref 12.0–15.0)
HEMOGLOBIN: 10.1 g/dL — AB (ref 12.0–15.0)
MCH: 33 pg (ref 26.0–34.0)
MCH: 33.2 pg (ref 26.0–34.0)
MCHC: 33.6 g/dL (ref 30.0–36.0)
MCHC: 33.7 g/dL (ref 30.0–36.0)
MCV: 98.2 fL (ref 78.0–100.0)
MCV: 98.7 fL (ref 78.0–100.0)
PLATELETS: 162 10*3/uL (ref 150–400)
PLATELETS: 158 10*3/uL (ref 150–400)
RBC: 3.3 MIL/uL — AB (ref 3.87–5.11)
RBC: 3.04 MIL/uL — AB (ref 3.87–5.11)
RDW: 13.8 % (ref 11.5–15.5)
RDW: 13.8 % (ref 11.5–15.5)
WBC: 4.7 10*3/uL (ref 4.0–10.5)
WBC: 5.1 10*3/uL (ref 4.0–10.5)

## 2015-07-11 LAB — BASIC METABOLIC PANEL
ANION GAP: 8 (ref 5–15)
Anion gap: 5 (ref 5–15)
BUN: 14 mg/dL (ref 6–20)
BUN: 17 mg/dL (ref 6–20)
CHLORIDE: 105 mmol/L (ref 101–111)
CO2: 23 mmol/L (ref 22–32)
CO2: 22 mmol/L (ref 22–32)
CREATININE: 1.07 mg/dL — AB (ref 0.44–1.00)
Calcium: 8.1 mg/dL — ABNORMAL LOW (ref 8.9–10.3)
Calcium: 7.4 mg/dL — ABNORMAL LOW (ref 8.9–10.3)
Chloride: 105 mmol/L (ref 101–111)
Creatinine, Ser: 0.83 mg/dL (ref 0.44–1.00)
GFR calc Af Amer: >60 mL/min (ref >60)
GFR, EST AFRICAN AMERICAN: 54 mL/min — AB (ref >60)
GFR, EST NON AFRICAN AMERICAN: 47 mL/min — AB (ref >60)
GLUCOSE: 106 mg/dL — AB (ref 65–99)
Glucose, Bld: 158 mg/dL — ABNORMAL HIGH (ref 65–99)
POTASSIUM: 4.2 mmol/L (ref 3.5–5.1)
POTASSIUM: 3.7 mmol/L (ref 3.5–5.1)
SODIUM: 132 mmol/L — AB (ref 135–145)
Sodium: 136 mmol/L (ref 135–145)

## 2015-07-11 LAB — PROTIME-INR
INR: 1.44 (ref 0.00–1.49)
Prothrombin Time: 17.6 seconds — ABNORMAL HIGH (ref 11.6–15.2)

## 2015-07-11 MED ORDER — METOPROLOL SUCCINATE ER 50 MG PO TB24
50.0000 mg | ORAL_TABLET | Freq: Every day | ORAL | Status: DC
  Administered 2015-07-12: 50 mg via ORAL
  Filled 2015-07-11: qty 1

## 2015-07-11 MED ORDER — SODIUM CHLORIDE 0.9 % IV BOLUS (SEPSIS)
500.0000 mL | Freq: Once | INTRAVENOUS | Status: AC
  Administered 2015-07-11: 500 mL via INTRAVENOUS

## 2015-07-11 MED ORDER — DILTIAZEM HCL ER COATED BEADS 240 MG PO CP24: 240.0000 mg | ORAL_CAPSULE | Freq: Every day | ORAL | Status: DC

## 2015-07-11 MED ORDER — DOCUSATE SODIUM 100 MG PO CAPS: 100.0000 mg | ORAL_CAPSULE | Freq: Three times a day (TID) | ORAL | Status: DC | PRN

## 2015-07-11 MED ORDER — OXYCODONE-ACETAMINOPHEN 5-325 MG PO TABS: 1.0000 | ORAL_TABLET | ORAL | Status: DC | PRN

## 2015-07-11 NOTE — Discharge Instructions (Signed)
Discharge Instructions after Total Shoulder Arthroplasty   A sling has been provided for you. You are to where this at all times, even while sleeping, until your first post operative visit with Dr. Tamera Punt. Use ice on the shoulder intermittently over the first 48 hours after surgery.  Pain medicine has been prescribed for you.  Use your medicine liberally over the first 48 hours, and then you can begin to taper your use. You may take Extra Strength Tylenol or Tylenol only in place of the pain pills. DO NOT take ANY nonsteroidal anti-inflammatory pain medications: Advil, Motrin, Ibuprofen, Aleve, Naproxen or Naprosyn.  Take one aspirin a day for 2 weeks after surgery, unless you have an aspirin sensitivity/allergy or asthma.  Leave your dressing on until your first follow up visit.  You may shower with the dressing.  Hold your arm as if you still have your sling on while you shower. Simply allow the water to wash over the site and then pat dry. Make sure your axilla (armpit) is completely dry after showering.    Please call [phone] during normal business hours or [phone] after hours for any problems. Including the following:  - excessive redness of the incisions - drainage for more than 4 days - fever of more than 101.5 F  *Please note that pain medications will not be refilled after hours or on weekends.

## 2015-07-11 NOTE — Progress Notes (Signed)
   PATIENT ID: Terri Small   1 Day Post-Op Procedure(s) (LRB): LEFT TOTAL SHOULDER ARTHROPLASTY (Left)  Subjective: Reports feeling okay this am. Block is wearing off. Pain well controlled. Hypotensive overnight but asymptomatic this am.  Objective:  Filed Vitals:   07/11/15 0825  BP: 87/46  Pulse: 80  Temp:   Resp:      L UE dressing with scant dried blood, intact Wiggles all fingers, distally NVI  Labs:   Recent Labs  07/11/15 0457  HGB 10.9*   Recent Labs  07/11/15 0457  WBC 4.7  RBC 3.30*  HCT 32.4*  PLT 162   Recent Labs  07/11/15 0457  NA 136  K 4.2  CL 105  CO2 23  BUN 14  CREATININE 0.83  GLUCOSE 106*  CALCIUM 8.1*    Assessment and Plan: 1 day s/p left TSA Hypotensive last night with NS bolus, hold lasix and metroprolol and reassess If BP improves and continues to be asymtpomatic can d/c home today OT- hand wrist elbow ROM only, no shoulder ROM given poor bone quality Fu with Dr. Tamera Punt in 2 weeks Scripts signed in chart Resume Coumadin 5mg  daily and follow up with Cardiologist this week for monitoring  VTE proph: coumadin 5mg , SCDs

## 2015-07-11 NOTE — Evaluation (Signed)
Occupational Therapy Evaluation Patient Details Name: Terri Small MRN: [id number] DOB: January 25, 1931 Today's Date: 07/11/2015    History of Present Illness s/p TSA   Clinical Impression   Pt was independent prior to admission.  Educated pt in AROM of L elbow to hand (per PA note no shoulder movement), positioning L UE in bed and chair, donning and doffing sling and hemitechniques for ADL.  Recommended pt consider a reacher to assist as needed with LB dressing as she has difficulty. Pt with little pain with nerve block wearing off.  Pt requesting OT return when her daughter gets to the hospital this afternoon.    Follow Up Recommendations  No OT follow up    Equipment Recommendations  3 in 1 bedside comode    Recommendations for Other Services       Precautions / Restrictions Precautions Precautions: Shoulder Type of Shoulder Precautions: no shoulder ROM, elbow to hand AROM only per PA note Shoulder Interventions: Shoulder sling/immobilizer;Off for dressing/bathing/exercises Precaution Booklet Issued: Yes (comment) Precaution Comments: reviewed handout Required Braces or Orthoses: Sling Restrictions Weight Bearing Restrictions: Yes LUE Weight Bearing: Non weight bearing      Mobility Bed Mobility Overal bed mobility: Modified Independent             General bed mobility comments: used rail, HOB up 30 degrees  Transfers Overall transfer level: Needs assistance   Transfers: Sit to/from Stand Sit to Stand: Supervision              Balance                                            ADL Overall ADL's : Needs assistance/impaired Eating/Feeding: Independent;Sitting   Grooming: Supervision/safety;Sitting   Upper Body Bathing: Minimal assitance;Sitting   Lower Body Bathing: Minimal assistance;Sit to/from stand   Upper Body Dressing : Minimal assistance;Sitting   Lower Body Dressing: Minimal assistance;Sit to/from stand   Toilet Transfer:  Supervision/safety;Ambulation   Toileting- Clothing Manipulation and Hygiene: Minimal assistance;Sit to/from stand       Functional mobility during ADLs: Supervision/safety (pt with low BP)       Vision     Perception     Praxis      Pertinent Vitals/Pain Pain Assessment: 0-10 Pain Score: 1  Pain Location: under arm Pain Descriptors / Indicators: Sore Pain Intervention(s): Premedicated before session (nerve block still intact)     Hand Dominance Right   Extremity/Trunk Assessment             Communication Communication Communication: No difficulties   Cognition Arousal/Alertness: Awake/alert Behavior During Therapy: WFL for tasks assessed/performed Overall Cognitive Status: Within Functional Limits for tasks assessed                     General Comments       Exercises Exercises: Other exercises Other Exercises: AROM R elbow to hand x 10   Shoulder Instructions      Home Living Family/patient expects to be discharged to:: Private residence Living Arrangements: Children Available Help at Discharge: Family;Available PRN/intermittently Type of Home: Apartment (attached to daughter's home) Home Access: Level entry     Home Layout: One level     Bathroom Shower/Tub: Occupational psychologist: Standard     Home Equipment: Shower seat - built in;Grab bars - tub/shower;Grab bars - toilet;Hand held shower head  Prior Functioning/Environment Level of Independence: Independent        Comments: drives    OT Diagnosis: Generalized weakness;Acute pain   OT Problem List: Decreased strength;Pain;Impaired UE functional use;Decreased knowledge of precautions   OT Treatment/Interventions: Self-care/ADL training;Therapeutic exercise;Patient/family education    OT Goals(Current goals can be found in the care plan section) Acute Rehab OT Goals Patient Stated Goal: home today OT Goal Formulation: With patient Time For Goal  Achievement: 07/18/15 Potential to Achieve Goals: Good ADL Goals Pt/caregiver will Perform Home Exercise Program:  (AROM elbow to hand only) Additional ADL Goal #1: Pt and daughter will be independent in donning and doffing sling. Additional ADL Goal #2: Pt will verbalize shoulder precautions during ADL and mobility.  OT Frequency: Min 2X/week   Barriers to D/C:            Co-evaluation              End of Session    Activity Tolerance: Patient tolerated treatment well Patient left: in chair;with call bell/phone within reach   Time: 0830-0918 OT Time Calculation (min): 48 min Charges:  OT General Charges $OT Visit: 1 Procedure OT Evaluation $Initial OT Evaluation Tier I: 1 Procedure OT Treatments $Self Care/Home Management : 8-22 mins $Therapeutic Exercise: 8-22 mins G-Codes:    Malka So 07/11/2015, 9:34 AM  [phone]

## 2015-07-11 NOTE — Progress Notes (Signed)
Occupational Therapy Treatment Patient Details Name: Terri Small MRN: [id number] DOB: 08-15-1931 Today's Date: 07/11/2015    History of present illness s/p TSA   OT comments  Returned for second visit and educated pt's daughter in precautions, positioning, sling use and ADL techniques.  Pt performed 10 reps of AROM L elbow>hand. Pain is well managed. Dr. Tamera Punt confirmed by phone that he wants no shoulder movement.  Follow Up Recommendations  No OT follow up    Equipment Recommendations  3 in 1 bedside comode    Recommendations for Other Services      Precautions / Restrictions Precautions Precautions: Shoulder Type of Shoulder Precautions: no shoulder ROM, elbow to hand AROM per conversation with Dr. Tamera Punt Shoulder Interventions: Shoulder sling/immobilizer;Off for dressing/bathing/exercises Precaution Comments: reviewed information in handout with daughter Required Braces or Orthoses: Sling Restrictions Weight Bearing Restrictions: Yes LUE Weight Bearing: Non weight bearing       Mobility Bed Mobility                  Transfers Overall transfer level: Needs assistance   Transfers: Sit to/from Stand Sit to Stand: Supervision              Balance                                   ADL                                         General ADL Comments: educated daughter in positioning L UE in bed and chair, donning and doffing sling and techniques for dressing without shoulder movement.      Vision                     Perception     Praxis      Cognition   Behavior During Therapy: WFL for tasks assessed/performed Overall Cognitive Status: Within Functional Limits for tasks assessed                       Extremity/Trunk Assessment               Exercises Other Exercises Other Exercises: AROM elbow to hand x 10   Shoulder Instructions       General Comments      Pertinent Vitals/  Pain       Pain Assessment: 0-10 Pain Score: 2  Pain Location: L shoulder Pain Descriptors / Indicators: Sore Pain Intervention(s): Premedicated before session  Home Living                                          Prior Functioning/Environment              Frequency Min 2X/week     Progress Toward Goals  OT Goals(current goals can now be found in the care plan section)  Progress towards OT goals: Progressing toward goals     Plan Discharge plan remains appropriate    Co-evaluation                 End of Session     Activity Tolerance Patient tolerated treatment well   Patient Left in chair;with call  bell/phone within reach;with family/visitor present   Nurse Communication          Time: 1415-1440 OT Time Calculation (min): 25 min  Charges: OT General Charges $OT Visit: 1 Procedure OT Treatments $Self Care/Home Management : 8-22 mins $Therapeutic Exercise: 8-22 mins  Malka So 07/11/2015, 2:57 PM  [phone]

## 2015-07-11 NOTE — Consult Note (Signed)
CARDIOLOGY CONSULT NOTE   Patient ID: Terri Small MRN: [id number] DOB/AGE: 79/16/1932 79 y.o.  Admit date: 07/10/2015  Primary Physician   METHENEY,CATHERINE, MD Primary Cardiologist  Dr. Lamar Blinks at [place] (Dr. Stanford Breed -last seen 04/2010) Reason for Consultation   Low BP  HPI: Terri Small is a 81 y.o. female with a history of chronic afib s/p 3x ablation on chronic coumadin, previously on amiodarone but had pulmonary SE, moderate mitral insufficieny, mild cardiomyopathy, pulmonary HTN, COPD, diastolic CHF who admitted 07/10/15 for left total shoulder arthoplasty and cardiology is consulted for postoperatively low BP.    She lives in [place] thus she changed her care to Dr. Mauricio Po at [facility]. Most of cardiac hx obtained from [place]. She was seen by Dr. Mauricio Po 06/24/15 for preoperative evaluation. She is on Cardizem CD 240mg  and Toprol XL 50mg  for rate controlled. On coumadin of anticoagulation. She was cleared for surgery with low risk for serious cardiac complications and advised to continued warfarin immediately postoperative. Coumadin was stopped 9/18 and restarted yesterday 07/10/15.   EKG showed afib at rate of 49. Tele showed afib with rate of 50s with frequent pause, longest 2.3sec. She received her cardizem CD 240mg  and Toprol XL 50mg  last night around 10pm. Around midnight her BP reduced to 81/53. Her lasix was held this morning. She has received 500cc bolus of fluids x 2. Now her BP improved to 92/48 with rate of 62.   She has chronic SOB that is currently at baseline. No recent exertional CP. She states that she had normal coronary arteries by cath in 2004 @ [place]. She had pulmonary vein isolation procedure in November of 2001 and 2004. She denies palpations, orthopnea, dizziness, syncope,  pnd, melena or blood in her stool.   Her last echocardiogram 04/2014 showed LVEF is 45-50% and she has severe biatrial enlargement. She has chronic stable 2+ mitral insufficiency. She has  diastolic dysfunction. She has moderate tricuspid insufficiency with pulmonary systolic pressures of around 50 which are unchanged over the last year.   Past Medical History  Diagnosis Date  . 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  . Osteoporosis   . Herpes zoster   . Fracture, fibula 11/26/2013  . Fracture, sternum closed 11/26/13  . Fracture of lateral malleolus 11/17/2013  . Dysrhythmia   . CHF (congestive heart failure)   . Shortness of breath dyspnea   . GERD (gastroesophageal reflux disease)   . Diverticulitis   . Headache   . COPD (chronic obstructive pulmonary disease)     "pulmonologist told me last week my lungs are good" (07/10/2015)  . Arthritis     "everywhere"     Past Surgical History  Procedure Laterality Date  . Colonoscopy    . Total shoulder arthroplasty Left 07/10/2015  . Joint replacement    . Laparoscopic cholecystectomy  ~ 2009  . Vaginal hysterectomy    . Atrial fibrillation ablation  ~ 2002; ~ 2004  . Cardiac catheterization  ~ 2002; ~ 2004    Allergies  Allergen Reactions  . Nexium [Esomeprazole Magnesium] Other (See Comments)    Chest heaviness.  . Sulfonamide Derivatives Other (See Comments)    REACTION: rash- burning on face    I have reviewed the patient's current medications . acetaminophen  1,000 mg Oral 4 times per day  . aspirin EC  325 mg Oral BID  . diltiazem  240 mg Oral Daily  .  docusate sodium  100 mg Oral BID  . furosemide  20 mg Oral Daily  . metoprolol succinate  50 mg Oral Daily  . trimethoprim  100 mg Oral Daily  . warfarin  5 mg Oral q1800  . Warfarin - Physician Dosing Inpatient   Does not apply q1800   . sodium chloride 100 mL/hr at 07/10/15 February 09, 2033   acetaminophen **OR** acetaminophen, alum & mag hydroxide-simeth, bisacodyl, diphenhydrAMINE, menthol-cetylpyridinium **OR** phenol, metoCLOPramide **OR** metoCLOPramide (REGLAN) injection, morphine injection,  ondansetron **OR** ondansetron (ZOFRAN) IV, oxyCODONE, polyethylene glycol, sodium phosphate, zolpidem  Prior to Admission medications   Medication Sig Start Date End Date Taking? Authorizing Provider  diltiazem (CARDIZEM CD) 240 MG 24 hr capsule Take 1 capsule (240 mg total) by mouth daily. 10/29/14  Yes Hali Marry, MD  estradiol (ESTRACE) 0.1 MG/GM vaginal cream Place 1 Applicatorful vaginally every other day.    Yes Historical Provider, MD  furosemide (LASIX) 20 MG tablet TAKE 1 TABLET BY MOUTH DAILY Patient taking differently: Take 20 mg by mouth once daily 10/30/14  Yes Hali Marry, MD  glucosamine-chondroitin 500-400 MG tablet Take 1 tablet by mouth daily.    Yes Historical Provider, MD  metoprolol succinate (TOPROL-XL) 50 MG 24 hr tablet Take 50 mg by mouth daily.   Yes Historical Provider, MD  Multiple Vitamins-Minerals (PRESERVISION AREDS 2 PO) Take 1 tablet by mouth 2 (two) times daily.   Yes Historical Provider, MD  trimethoprim (TRIMPEX) 100 MG tablet Take 1 tablet (100 mg total) by mouth daily. 12/23/14  Yes Hali Marry, MD  warfarin (COUMADIN) 5 MG tablet Take 1 tablet (5 mg total) by mouth daily. Patient taking differently: Take 2.5-5 mg by mouth daily. Take 2.5mg  by mouth on Thursday and take 5 mg by mouth on all other days 10/29/14  Yes Hali Marry, MD  docusate sodium (COLACE) 100 MG capsule Take 1 capsule (100 mg total) by mouth 3 (three) times daily as needed. 07/11/15   Grier Mitts, PA-C  oxyCODONE-acetaminophen (ROXICET) 5-325 MG per tablet Take 1-2 tablets by mouth every 4 (four) hours as needed for severe pain. 07/11/15   Grier Mitts, PA-C     Social History   Social History  . Marital Status: Widowed    Spouse Name: [name]  . Number of Children: 4  . Years of Education: N/A   Occupational History  . Retired Network engineer   .     Social History Main Topics  . Smoking status: Former Smoker -- 1.00 packs/day for 6 years     Types: Cigarettes    Quit date: 10/18/1960  . Smokeless tobacco: Never Used  . Alcohol Use: 2.4 oz/week    2 Standard drinks or equivalent, 2 Glasses of wine per week  . Drug Use: No  . Sexual Activity: No   Other Topics Concern  . Not on file   Social History Narrative   Retired.  Some college. Husband [name] passed away in 02-09-2014, had alzheimers.   Has 4 adult children. Lives with her daughter and granddaughter.     Regular exercise-yes    Family Status  Relation Status Death Age  . Father Deceased 7    CVA  . Mother Deceased 21    Heart problems   Family History  Problem Relation Age of Onset  . Stroke Father   . Atrial fibrillation    . Emphysema Father      ROS:  Full 14 point review of systems complete and found  to be negative unless listed above.  Physical Exam: Blood pressure 82/46, pulse 50, temperature 97.8 F (36.6 C), temperature source Oral, resp. rate 16, weight 159 lb (72.122 kg), SpO2 93 %.  General: Well developed, well nourished, female in no acute distress Head: Eyes PERRLA, No xanthomas. Normocephalic and atraumatic, oropharynx without edema or exudate.  Lungs: Resp regular and unlabored, CTA. Heart: slow irregularly irregular no s3, s4. Systolic murmurs..   Neck: No carotid bruits. No lymphadenopathy.  No JVD. Abdomen: Bowel sounds present, abdomen soft and non-tender without masses or hernias noted. Msk:  No spine or cva tenderness. No weakness, no joint deformities or effusions. Extremities: No clubbing, cyanosis or edema. DP/PT/Radials 2+ and equal bilaterally. Sling on left arm.  Neuro: Alert and oriented X 3. No focal deficits noted. Psych:  Good affect, responds appropriately Skin: No rashes or lesions noted.  Labs:   Lab Results  Component Value Date   WBC 5.1 07/11/2015   HGB 10.1* 07/11/2015   HCT 30.0* 07/11/2015   MCV 98.7 07/11/2015   PLT 158 07/11/2015    Recent Labs  07/11/15 0457  INR 1.44    Recent Labs Lab  07/11/15 0457  NA 136  K 4.2  CL 105  CO2 23  BUN 14  CREATININE 0.83  CALCIUM 8.1*  GLUCOSE 106*    Lab Results  Component Value Date   CHOL 170 02/12/2014   HDL 64 02/12/2014   LDLCALC 90 02/12/2014   TRIG 81 02/12/2014   Echo: 05/16/2014 Her echocardiogram 04/2014 LVEF is 45-50% and she has severe biatrial enlargement. She has chronic stable 2+ mitral insufficiency. She has diastolic dysfunction. She has moderate tricuspid insufficiency with pulmonary systolic pressures of around 50 which are unchanged over the last year.  ECG:  Vent. rate 49 BPM PR interval * ms QRS duration 96 ms QT/QTc 460/415 ms P-R-T axes * 91 -37  Radiology:  Dg Shoulder Left Port  07/10/2015   CLINICAL DATA:  Post LEFT total shoulder arthroplasty  EXAM: LEFT SHOULDER - 1 VIEW  COMPARISON:  Exam at 1738 hr compared to CT shoulder exam of 06/24/2015  FINDINGS: LEFT shoulder prosthesis identified.  Bones demineralized.  No acute fracture or dislocation.  AC joint alignment normal.  IMPRESSION: LEFT shoulder prosthesis without definite acute complication.   Electronically Signed   By: Lavonia Dana M.D.   On: 07/10/2015 18:54    ASSESSMENT AND PLAN:      1. Chronic atrial fibrillation with slow ventricular rate.  - CHADSVASC score of at least 5 (sex, age, CHF, HTN). Continue coumadin for anticoagulation. - She is on Cardizem cd and Toprol XL for rate controlled. Her rate and BP was normal up until she took both of these medications last night. BP and pulse is improving with 500cc off fluids x 2. Her estimated blood loss was 32ml during surgery. - Hold Cardizem CD and Troprol XL tonight and reevaluate in morning. Tele with pauses, longest 2.3sec. No indication of pacemaker right now.  - 04/2014 LVEF is 45-50% and she has severe biatrial enlargement. She has chronic stable 2+ mitral insufficiency. She has diastolic dysfunction. She has moderate tricuspid insufficiency with pulmonary systolic pressures of around  50.   2. Low BP - as above, likely due to medications  3. MR - She appears euvolemic. Lungs clear. Held lasix this morning. Hold morning (tomorrow) dose of lasix if BP still low.  - Echo as above  4. Chronic diastolic CHF - as above.  5. Glenohumeral arthritis: s/p L shoulder arthroplasty per primary  Signed: Bhagat,Bhavinkumar, PA 07/11/2015, 2:32 PM   I have seen and examined the patient along with Bhagat,Bhavinkumar, PA.  I have reviewed the chart, notes and new data.  I agree with PA's note.  Key new complaints: she feels well; just slightly tired earlier. She was NPO before the procedure and did not eat last night.Had been on furosemide Key examination changes: irregular rhythm, otherwise normal exam; no bleeding   PLAN:  I suspect her hypotension is due to hypovolemia, maybe some residual post anesthesia vasodilation and simultaneous administration of diltiazem and metoprolol. Would hold both those medications tonight and push fluids. Restart her meds per usual schedule tomorrow night and continue warfarin. F/U as scheduled with Dr. Mauricio Po.  Sanda Klein, MD, [place] [phone] 07/11/2015, 4:03 PM

## 2015-07-11 NOTE — Progress Notes (Signed)
Pt bp was taken at 1043 and was 72/39 manually, HR was 55 and at times dip in the lower 40's. Pt had already received one 500 cc bolus at around 0700 this morning. Called Dr. Tamera Punt and spoke with his PA who stated to give another 500cc bolus and obtain a stat EKG. Rapid response was called so they can round on the patient. Pt is alert and oriented at this time and does not complain of any dizziness, no active signs of bleeding. Will continue to monitor

## 2015-07-11 NOTE — Progress Notes (Signed)
Utilization review completed. Sumit Branham, RN, BSN. 

## 2015-07-11 NOTE — Progress Notes (Signed)
After bolus BP was 82/42 and HR 50. EKG showed afib with slower ventricular response. Called MD with results. MD wants to consult cardiology. Will continue to monitor

## 2015-07-11 NOTE — Progress Notes (Signed)
Called by RN to round on patient as "second set of eye" assessment.  Patient is alert, w/d, slightly pale, mentation intact.  She denies SOB, CP or dizziness - has been OOB to chair and BR without dizziness.  Good cap refill.  Bil BS = clear.  No peripheral edema - no calf pain.  Left arm in sling - left shoulder DDI - some swelling and bruising noted in left upper arm below the incision - soft - tender to touch.  Abd soft.  Ate well.  Manual BP right arm 82/42 - unable to get reading left arm secondary to surgery - patient denies hx difference BP in arms.  HR 50 - hx chronic afib - RR 18 O2 sats 93%.  Labs noted - Hgb 10.9/32 hct - pre-op 13.9/41.9.   Low BP reported to MD per Andee Poles RN - RN advised to hold all cardiac meds.  Patient educated to call for symptoms of CP, SOB, dizziness and to not get OOB without assistance.  12 lead done - shows afib with slow ventricular response - no acute changes noted compared with 2014 scan.  Available as needed.

## 2015-07-11 NOTE — Progress Notes (Signed)
Per DR.Chandler and PA will let patient go home today only if blood pressure increases and is not symptomatic. Per MD was told to hold patient daily dose of Lasix. Pt states she is not feeling dizzy and feels okay when ambulating. Will continue to monitor.

## 2015-07-12 DIAGNOSIS — I9589 Other hypotension: Secondary | ICD-10-CM

## 2015-07-12 DIAGNOSIS — I9581 Postprocedural hypotension: Secondary | ICD-10-CM | POA: Diagnosis not present

## 2015-07-12 DIAGNOSIS — M19019 Primary osteoarthritis, unspecified shoulder: Secondary | ICD-10-CM

## 2015-07-12 LAB — PROTIME-INR
INR: 1.68 — AB (ref 0.00–1.49)
PROTHROMBIN TIME: 19.8 s — AB (ref 11.6–15.2)

## 2015-07-12 NOTE — Care Management Note (Signed)
Case Management Note  Patient Details  Name: Cash Meadow MRN: [id number] Date of Birth: November 19, 1930  Subjective/Objective:                    Action/Plan:   Expected Discharge Date:                  Expected Discharge Plan:     In-House Referral:     Discharge planning Services     Post Acute Care Choice:    Choice offered to:     DME Arranged:    DME Agency:     HH Arranged:    [place] Agency:     Status of Service:     Medicare Important Message Given:   yes Date Medicare IM Given:   07/12/15 Medicare IM give by:   Frann Rider, RN, BSN Date Additional Medicare IM Given:    Additional Medicare Important Message give by:     If discussed at [place] of Stay Meetings, dates discussed:    Additional Comments:  Norina Buzzard, RN 07/12/2015, 11:28 AM

## 2015-07-12 NOTE — Progress Notes (Signed)
BP appears improved this morning. Agree with Dr. Victorino December consult recommendations. Follow-up after discharge with her [facility] cardiologist. Will sign-off. Call with questions.  Pixie Casino, MD, [facility] Attending Cardiologist [place]

## 2015-07-12 NOTE — Progress Notes (Signed)
    Patient doing well 2 days S/P L total shoulder per Dr Tamera Punt Px well controlled, has been performing ROM exercises at elbow, wrist and hand, tolerating sling. Cardiology evaluated pt and f/u has cleared her for D/C home with f/u per her regular cardiologist, Dr Mauricio Po. Pts daughter is on way to pick her up. Pt has no complaints.   Physical Exam: BP 121/58 mmHg  Pulse 83  Temp(Src) 98.4 F (36.9 C) (Oral)  Resp 18  Wt 72.122 kg (159 lb)  SpO2 93%   CBC Latest Ref Rng 07/11/2015 07/11/2015 07/04/2015  WBC 4.0 - 10.5 K/uL 5.1 4.7 5.4  Hemoglobin 12.0 - 15.0 g/dL 10.1(L) 10.9(L) 13.9  Hematocrit 36.0 - 46.0 % 30.0(L) 32.4(L) 41.9  Platelets 150 - 400 K/uL 158 162 199   BMP Latest Ref Rng 07/11/2015 07/11/2015 07/04/2015  Glucose 65 - 99 mg/dL 158(H) 106(H) 84  BUN 6 - 20 mg/dL 17 14 14   Creatinine 0.44 - 1.00 mg/dL 1.07(H) 0.83 0.94  Sodium 135 - 145 mmol/L 132(L) 136 137  Potassium 3.5 - 5.1 mmol/L 3.7 4.2 4.1  Chloride 101 - 111 mmol/L 105 105 104  CO2 22 - 32 mmol/L 22 23 26   Calcium 8.9 - 10.3 mg/dL 7.4(L) 8.1(L) 9.2    Dressing in place, L sling in place, moderate distal swelling, distal compartments soft, 2+ DRP=BIL. Moderate ecchymosis around bandage. Pt sitting up comfortably. NVI  POD #2 s/p L Total Shoulder per Dr Tamera Punt doing well  - Completed OT, encourage ambulation and ROM exercises at elbow/wrist/hand - Percocet for pain with colace, scripts printed and in chart for D/C - d/c home today with daughter - Cleared by cardiology after fluid resuscitation and BP stabilization, will f/u with home cardiologist outpatient -F/U with Dr Tamera Punt 2 weeks in office -Continue coumadin

## 2015-07-12 NOTE — Progress Notes (Signed)
Occupational Therapy Treatment and Discharge Patient Details Name: Terri Small MRN: [id number] DOB: 11-16-1930 Today's Date: 07/12/2015    History of present illness s/p TSA   OT comments  This 81 yo female doing great post shoulder surgery. She nor daughter have any other questions concerning BADLs and LUE exercises, acute OT will sign off.  Follow Up Recommendations  No OT follow up          Precautions / Restrictions Precautions Precautions: Shoulder Type of Shoulder Precautions: no shoulder ROM, elbow to hand AROM ok per conversation with Dr. Tamera Punt Shoulder Interventions: Shoulder sling/immobilizer;Off for dressing/bathing/exercises Required Braces or Orthoses: Sling Restrictions Weight Bearing Restrictions: Yes LUE Weight Bearing: Non weight bearing       Mobility Bed Mobility               General bed mobility comments: Pt up in recliner upon my arrival  Transfers Overall transfer level: Needs assistance   Transfers: Sit to/from Stand Sit to Stand: Supervision                  ADL                                         General ADL Comments: Daughter able to safely and properly help pt with donning of house dress that zips up front following shoulder precautions. Daughter able to don pt's sling for pt independently      Vision                 Additional Comments: No change from baseline          Cognition   Behavior During Therapy: WFL for tasks assessed/performed Overall Cognitive Status: Within Functional Limits for tasks assessed                         Exercises Other Exercises Other Exercises: Pt completed 10 reps of AROM for left elbow, forearm, and wrist           Pertinent Vitals/ Pain       Pain Assessment: No/denies pain         Frequency Min 2X/week     Progress Toward Goals  OT Goals(current goals can now be found in the care plan section)  Progress towards OT goals: Goals  met/education completed, patient discharged from [place] Discharge plan remains appropriate       End of Session     Activity Tolerance Patient tolerated treatment well   Patient Left in chair;with call bell/phone within reach;with family/visitor present   Nurse Communication          Time: 0100-7121 OT Time Calculation (min): 18 min  Charges: OT General Charges $OT Visit: 1 Procedure OT Treatments $Self Care/Home Management : 8-22 mins  Almon Register [phone] 07/12/2015, 4:43 PM

## 2015-07-14 ENCOUNTER — Telehealth: Payer: Self-pay | Admitting: Family Medicine

## 2015-07-14 NOTE — Telephone Encounter (Signed)
Pt left message on Triage line today stating she had surgery last week (TSA) and was off her coumadin one week prior to surgery. She started her coumadin back, and had INR checked on 07/12/15. Per Pt, the value was "1.7" and actual lab values are in our system. Pt just "wanted to make sure Dr. Madilyn Fireman knew what her levels were."  Will route.

## 2015-07-14 NOTE — Telephone Encounter (Signed)
Have her take 5 mg every day and recheck in 3 days.

## 2015-07-14 NOTE — Telephone Encounter (Signed)
Pt advised of dosage directions and 3 day INR check was scheduled. No further questions.

## 2015-07-16 ENCOUNTER — Encounter (HOSPITAL_COMMUNITY): Payer: Self-pay | Admitting: Orthopedic Surgery

## 2015-07-17 ENCOUNTER — Ambulatory Visit (INDEPENDENT_AMBULATORY_CARE_PROVIDER_SITE_OTHER): Payer: Medicare Other | Admitting: Family Medicine

## 2015-07-17 VITALS — BP 116/72 | Wt 164.0 lb

## 2015-07-17 DIAGNOSIS — I4891 Unspecified atrial fibrillation: Secondary | ICD-10-CM

## 2015-07-17 LAB — POCT INR: INR: 2

## 2015-07-17 MED ORDER — WARFARIN SODIUM 1 MG PO TABS: ORAL_TABLET | ORAL | Status: DC

## 2015-07-17 NOTE — Addendum Note (Signed)
Addended by: Delrae Alfred on: 07/17/2015 01:50 PM   Modules accepted: Orders

## 2015-07-28 ENCOUNTER — Other Ambulatory Visit: Payer: Self-pay | Admitting: Family Medicine

## 2015-07-28 NOTE — Telephone Encounter (Signed)
Last seen for congestive heart failure on 9/16 with Sharpes [facility]

## 2015-08-01 ENCOUNTER — Telehealth: Payer: Self-pay | Admitting: *Deleted

## 2015-08-01 ENCOUNTER — Ambulatory Visit (INDEPENDENT_AMBULATORY_CARE_PROVIDER_SITE_OTHER): Payer: Medicare Other | Admitting: Osteopathic Medicine

## 2015-08-01 VITALS — BP 103/65 | HR 67 | Wt 161.0 lb

## 2015-08-01 DIAGNOSIS — I4891 Unspecified atrial fibrillation: Secondary | ICD-10-CM | POA: Diagnosis not present

## 2015-08-01 LAB — POCT INR: INR: 3.1

## 2015-08-01 NOTE — Telephone Encounter (Signed)
-----   Message from Emeterio Reeve, DO sent at 08/01/2015  9:44 AM EDT ----- Please call patient - 5mg  daily and RTC 2 weeks for recheck

## 2015-08-01 NOTE — Telephone Encounter (Signed)
Pt.notified

## 2015-08-13 ENCOUNTER — Other Ambulatory Visit: Payer: Self-pay | Admitting: Family Medicine

## 2015-08-13 NOTE — Telephone Encounter (Signed)
LOV 84/16 Labs 07/04/15

## 2015-08-15 ENCOUNTER — Ambulatory Visit (INDEPENDENT_AMBULATORY_CARE_PROVIDER_SITE_OTHER): Payer: Medicare Other | Admitting: Family Medicine

## 2015-08-15 VITALS — BP 125/75 | HR 70 | Temp 97.9°F | Wt 161.0 lb

## 2015-08-15 DIAGNOSIS — I4891 Unspecified atrial fibrillation: Secondary | ICD-10-CM | POA: Diagnosis not present

## 2015-08-15 LAB — POCT INR: INR: 2.8

## 2015-08-15 NOTE — Progress Notes (Signed)
Left VM concerning Coumadin instruction's. Ask patient to give [place] a call back.

## 2015-08-15 NOTE — Progress Notes (Signed)
Patient ID: Terri Small, female   DOB: January 28, 1931, 81 y.o.   MRN: [id number]   Patient here today for Coumadin Clinic. No complaint's of shortness of breath or chest pain. Patient tolerated well. Today's results 2.8

## 2015-08-18 NOTE — Discharge Summary (Signed)
Patient ID: Terri Small MRN: [id number] DOB/AGE: Aug 10, 1931 81 y.o.  Admit date: 07/10/2015 Discharge date: 07/12/2015  Admission Diagnoses:  Principal Problem:   Glenohumeral arthritis Active Problems:   Atrial fibrillation (HCC)   Congestive heart failure with left ventricular diastolic dysfunction (HCC)   Chronic diastolic heart failure (HCC)   Cardiomyopathy (HCC)   Postoperative hypotension   Discharge Diagnoses:  Same  Past Medical History  Diagnosis Date  . Atrial fibrillation ([place])     s/p 2x ablation on chronic coumadin, previously on amiodarone but had pulmonary SE.  . Osteoporosis   . Herpes zoster   . Fracture, fibula 11/26/2013  . Fracture, sternum closed 11/26/13  . Fracture of lateral malleolus 11/17/2013  . Dysrhythmia   . CHF (congestive heart failure) ([place])   . Shortness of breath dyspnea   . GERD (gastroesophageal reflux disease)   . Diverticulitis   . Headache   . COPD (chronic obstructive pulmonary disease) [facility])     "pulmonologist told me last week my lungs are good" (07/10/2015)  . Arthritis     "everywhere"    Surgeries: Procedure(s): LEFT TOTAL SHOULDER ARTHROPLASTY on 07/10/2015   Consultants:    Discharged Condition: Improved  Hospital Course: [name] is an 81 y.o. female who was admitted 07/10/2015 for operative treatment ofGlenohumeral arthritis. Patient has severe unremitting pain that affects sleep, daily activities, and work/hobbies. After pre-op clearance the patient was taken to the operating room on 07/10/2015 and underwent  Procedure(s): LEFT TOTAL SHOULDER ARTHROPLASTY.    Patient was given perioperative antibiotics:  Anti-infectives    Start     Dose/Rate Route Frequency Ordered Stop   07/11/15 1000  trimethoprim (TRIMPEX) tablet 100 mg  Status:  Discontinued     100 mg Oral Daily 07/10/15 1917 07/12/15 1634   07/11/15 0600  ceFAZolin (ANCEF) IVPB 2 g/50 mL premix     2 g 100 mL/hr over 30 Minutes Intravenous On call to O.R.  07/10/15 1220 07/10/15 1448   07/10/15 2100  ceFAZolin (ANCEF) IVPB 1 g/50 mL premix     1 g 100 mL/hr over 30 Minutes Intravenous Every 6 hours 07/10/15 1917 07/11/15 0854   07/10/15 1209  ceFAZolin (ANCEF) 2-3 GM-% IVPB SOLR    Comments:  Sammuel Cooper   : cabinet override      07/10/15 1209 07/11/15 0014       Patient was given sequential compression devices, early ambulation, and coumadin to prevent DVT.  Experienced hypotension and slow HR day 1 po, consulted cardiology who adjusted medications, 2 L bolus NS and patient improved and was stable. Patient benefited maximally from hospital stay and there were no complications.    Recent vital signs: No data found.    Recent laboratory studies: No results for input(s): WBC, HGB, HCT, PLT, NA, K, CL, CO2, BUN, CREATININE, GLUCOSE, INR, CALCIUM in the last 72 hours.  Invalid input(s): PT, 2   Discharge Medications:     Medication List    STOP taking these medications        furosemide 20 MG tablet  Commonly known as:  LASIX      TAKE these medications        diltiazem 240 MG 24 hr capsule  Commonly known as:  CARDIZEM CD  Take 1 capsule (240 mg total) by mouth daily.     docusate sodium 100 MG capsule  Commonly known as:  COLACE  Take 1 capsule (100 mg total) by mouth 3 (three) times daily as  needed.     estradiol 0.1 MG/GM vaginal cream  Commonly known as:  ESTRACE  Place 1 Applicatorful vaginally every other day.     glucosamine-chondroitin 500-400 MG tablet  Take 1 tablet by mouth daily.     metoprolol succinate 50 MG 24 hr tablet  Commonly known as:  TOPROL-XL  Take 50 mg by mouth daily.     oxyCODONE-acetaminophen 5-325 MG tablet  Commonly known as:  ROXICET  Take 1-2 tablets by mouth every 4 (four) hours as needed for severe pain.     PRESERVISION AREDS 2 PO  Take 1 tablet by mouth 2 (two) times daily.     trimethoprim 100 MG tablet  Commonly known as:  TRIMPEX  Take 1 tablet (100 mg total) by mouth  daily.     warfarin 5 MG tablet  Commonly known as:  COUMADIN  Take 1 tablet (5 mg total) by mouth daily.        Diagnostic Studies: No results found.  Disposition: 01-Home or Self Care      Discharge Instructions    Call MD / Call 911    Complete by:  As directed   If you experience chest pain or shortness of breath, CALL 911 and be transported to the hospital emergency room.  If you develope a fever above 101 F, pus (white drainage) or increased drainage or redness at the wound, or calf pain, call your surgeon's office.     Constipation Prevention    Complete by:  As directed   Drink plenty of fluids.  Prune juice may be helpful.  You may use a stool softener, such as Colace (over the counter) 100 mg twice a day.  Use MiraLax (over the counter) for constipation as needed.     Diet - low sodium heart healthy    Complete by:  As directed      Increase activity slowly as tolerated    Complete by:  As directed            Follow-up Information    Follow up with Nita Sells, MD. Schedule an appointment as soon as possible for a visit in 2 weeks.   Specialty:  Orthopedic Surgery   Contact information:   [address] [place] [address] [phone]        Signed: Grier Mitts 08/18/2015, 2:13 PM

## 2015-08-22 ENCOUNTER — Encounter (INDEPENDENT_AMBULATORY_CARE_PROVIDER_SITE_OTHER): Payer: Self-pay

## 2015-08-22 ENCOUNTER — Ambulatory Visit (INDEPENDENT_AMBULATORY_CARE_PROVIDER_SITE_OTHER): Payer: Medicare Other | Admitting: Rehabilitative and Restorative Service Providers"

## 2015-08-22 ENCOUNTER — Encounter: Payer: Self-pay | Admitting: Rehabilitative and Restorative Service Providers"

## 2015-08-22 DIAGNOSIS — M25512 Pain in left shoulder: Secondary | ICD-10-CM | POA: Diagnosis present

## 2015-08-22 DIAGNOSIS — R6889 Other general symptoms and signs: Secondary | ICD-10-CM

## 2015-08-22 DIAGNOSIS — R29898 Other symptoms and signs involving the musculoskeletal system: Secondary | ICD-10-CM

## 2015-08-22 DIAGNOSIS — Z7409 Other reduced mobility: Secondary | ICD-10-CM

## 2015-08-22 DIAGNOSIS — M25612 Stiffness of left shoulder, not elsewhere classified: Secondary | ICD-10-CM | POA: Diagnosis not present

## 2015-08-22 DIAGNOSIS — R531 Weakness: Secondary | ICD-10-CM

## 2015-08-22 NOTE — Patient Instructions (Signed)
Massage with ball ~4 inch rubber ball  Can try [employer] for the ball)  Shoulder Blade Squeeze    Rotate shoulders back, then squeeze shoulder blades down and back. Hold 10 sec  Repeat __10__ times. Do _several___ sessions per day. Can use swim noodle behind back along spine Sitting of standing  Sitting with elbows bent at 90 degrees; use can to help bring Lt hand out to the side Hold 10 sec 10 reps     ROM: Flexion - Wand (Supine)    Lie on back holding wand. Raise arms over head. Hold 3-5 sec Repeat __5-10__ times per set. Do __2-3 __ sessions per day.   Continue exercises from doctor

## 2015-08-22 NOTE — Therapy (Signed)
[place], [place], [address] Phone: [phone]   Fax:  [phone]  Physical Therapy Evaluation  Patient Details  Name: Terri Small MRN: [id number] Date of Birth: 1930/10/31 Referring Provider: Dr. Tania Ade  Encounter Date: 08/22/2015      PT End of Session - 08/22/15 1249    Visit Number 1   Number of Visits 24   Date for PT Re-Evaluation 11/14/15   PT Start Time 0833   PT Stop Time 0929   PT Time Calculation (min) 56 min   Activity Tolerance Patient tolerated treatment well      Past Medical History  Diagnosis Date  . Atrial fibrillation ([place])     s/p 2x ablation on chronic coumadin, previously on amiodarone but had pulmonary SE.  . Osteoporosis   . Herpes zoster   . Fracture, fibula 11/26/2013  . Fracture, sternum closed 11/26/13  . Fracture of lateral malleolus 11/17/2013  . Dysrhythmia   . CHF (congestive heart failure) ([place])   . Shortness of breath dyspnea   . GERD (gastroesophageal reflux disease)   . Diverticulitis   . Headache   . COPD (chronic obstructive pulmonary disease) [facility])     "pulmonologist told me last week my lungs are good" (07/10/2015)  . Arthritis     "everywhere"    Past Surgical History  Procedure Laterality Date  . Colonoscopy    . Total shoulder arthroplasty Left 07/10/2015  . Joint replacement    . Laparoscopic cholecystectomy  ~ 2009  . Vaginal hysterectomy    . Atrial fibrillation ablation  ~ 2002; ~ 2004  . Cardiac catheterization  ~ 2002; ~ 2004  . Total shoulder arthroplasty Left 07/10/2015    Procedure: LEFT TOTAL SHOULDER ARTHROPLASTY;  Surgeon: Tania Ade, MD;  Location: [facility];  Service: Orthopedics;  Laterality: Left;  Left shoulder arthroplasty    There were no vitals filed for this visit.  Visit Diagnosis:  Pain in shoulder region, left - Plan: PT plan of care cert/re-cert  Stiffness of joint, shoulder region, left - Plan: PT plan  of care cert/re-cert  Weakness of shoulder - Plan: PT plan of care cert/re-cert  Decreased strength, endurance, and mobility - Plan: PT plan of care cert/re-cert      Subjective Assessment - 08/22/15 0807    Subjective Terri Small reports that she has had Lt shoulder weakness and dysfunction for 8-10 years. Symptoms incresead in the past year with severe pain with patient unable to walk with arm hanging at side. Seen by orthopedist with TSA 07/10/15.   Pertinent History Shoulder dysfunction for 8-10 years; arthritis bilat knees/multiple joints   How long can you sit comfortably? no limit   How long can you stand comfortably? no limit   How long can you walk comfortably? no limit   Diagnostic tests MRI   Patient Stated Goals full use of Lt arm - to be able to drive and reach into shelf   Currently in Pain? No/denies   Pain Score 1    Pain Location Shoulder   Pain Orientation Left   Pain Descriptors / Indicators Aching   Pain Type Chronic pain   Pain Onset More than a month ago   Pain Frequency Intermittent   Aggravating Factors  reaching; moving arm in certain directions   Pain Relieving Factors OTC meds; resting arm             [facility] PT Assessment - 08/22/15 0001  Assessment   Medical Diagnosis Lt shoulder arthroplasty   Referring Provider Dr. Tania Ade   Onset Date/Surgical Date 07/10/15   Hand Dominance Right   Next MD Visit 12/16   Prior Therapy 8-10 years ago   Precautions   Precaution Comments no lifting   Balance Screen   Has the patient fallen in the past 6 months No   Has the patient had a decrease in activity level because of a fear of falling?  No   Is the patient reluctant to leave their home because of a fear of falling?  No   Prior Function   Level of Independence Independent  in apt in daughters house   Vocation Retired   Leisure household chores/some food prep; water aerobics 3 x/wk; Tree surgeon; bible study   Observation/Other Assessments   Focus  on Therapeutic Outcomes (FOTO)  50% limitaion   Sensation   Additional Comments WNL's per pt report   AROM   Right/Left Shoulder --  Rt shoulder motions feel "stiff"   Right Shoulder Extension 64 Degrees  pain   Right Shoulder Flexion 134 Degrees   Right Shoulder ABduction 132 Degrees   Right Shoulder Internal Rotation 17 Degrees   Right Shoulder External Rotation 73 Degrees   Left Shoulder Extension 35 Degrees   Left Shoulder Flexion 60 Degrees   Left Shoulder ABduction 61 Degrees  pain   Left Shoulder Internal Rotation 18 Degrees   Left Shoulder External Rotation 16 Degrees   Cervical Flexion 55   Cervical Extension 18   Cervical - Right Side Bend 44   Cervical - Left Side Bend 42   Cervical - Right Rotation 71   Cervical - Left Rotation 60   Strength   Right/Left Shoulder --  Rt shd 5-/5 to 5/5 throughout - Lt NT   Palpation   Palpation comment tenderness and tightness Lt periscapulsr musculature; pecs; deltoid; biceps                    [facility] Adult PT Treatment/Exercise - 08/22/15 0001    Self-Care   Self-Care --  myofacial ball release work    Neuro Re-ed    Neuro Re-ed Details  postural correction difficult d/t LBP   Shoulder Exercises: Supine   Flexion --  w/cane for ROM bilat 3-4 sec x5   Shoulder Exercises: Seated   Other Seated Exercises ER with cane 10 sec x10    Shoulder Exercises: Standing   Other Standing Exercises scap squeeze w/ noodle 10 sec x10 sitting  LBP with standing tried sitting   Cryotherapy   Number Minutes Cryotherapy 15 Minutes   Cryotherapy Location Shoulder   Type of Cryotherapy Ice pack   Electrical Stimulation   Electrical Stimulation Location Lt shoulder   Electrical Stimulation Action IFC   Electrical Stimulation Parameters to tolerance   Electrical Stimulation Goals Pain                PT Education - 08/22/15 1248    Education provided Yes   Education Details importance of posture and alignment with  shoulder function; myofacial ball release work; Production manager) Educated Patient   Methods Explanation;Demonstration;Tactile cues;Verbal cues;Handout   Comprehension Verbalized understanding;Returned demonstration;Verbal cues required;Tactile cues required          PT Short Term Goals - 08/22/15 1316    PT SHORT TERM GOAL #1   Title Improve posture and alignment working to strengtheing posterior shoulder girdle musculature 10/02/14   Time  6   Period Weeks   Status New   PT SHORT TERM GOAL #2   Title Patient I in initial HEP 09/19/15   Time 4   Period Weeks   Status New   PT SHORT TERM GOAL #3   Title [place] to incooporate shoudler exercise to pool exercise program 09/19/15   Time 4   Period Weeks   Status New   PT SHORT TERM GOAL #4   Title Increase AROM Lt shoudler by 10-20 degrees throughout 10/03/15   Time 6   Period Weeks   Status New           PT Long Term Goals - 08/22/15 1321    PT LONG TERM GOAL #1   Title Increase AROM Lt shoulder to [facility] 11/14/15   Time 12   Period Weeks   Status New   PT LONG TERM GOAL #2   Title Functional strength allowing patient to reach up into low cabinet 11/14/15   Time 12   Period Weeks   Status New   PT LONG TERM GOAL #3   Title Use Lt UE for crafts and light household chores 11/14/15   Time 12   Period Weeks   Status New   PT LONG TERM GOAL #4   Title I in HEP for discharge including water exercise 11/14/15   Time 12   Period Weeks   Status New   PT LONG TERM GOAL #5   Title FOTO </= 36% limitation 11/14/15   Time 12   Period Weeks   Status New               Plan - 08/22/15 1311    Clinical Impression Statement Terri Small presents 6 weeks, 1 day post Lt total shoudler arthroplasty with 8-10 year history of shoulder dysfunction. She has poor posture and alignment; decresead A/PROM; decresaed strength; decresaed functional activities and pain in Rt shoulder on a daily basis. She will benefit form PT to address  limitations as identified.    Pt will benefit from skilled therapeutic intervention in order to improve on the following deficits Postural dysfunction;Improper body mechanics;Decreased range of motion;Decreased mobility;Decreased strength;Pain;Increased fascial restricitons;Decreased endurance;Decreased activity tolerance   Rehab Potential Good   PT Frequency 2x / week   PT Duration 12 weeks   PT Treatment/Interventions Patient/family education;ADLs/Self Care Home Management;Therapeutic exercise;Therapeutic activities;Manual techniques;Neuromuscular re-education;Dry needling;Cryotherapy;Electrical Stimulation;Moist Heat;Ultrasound   PT Next Visit Plan progress with ROM and initial strengthening exercises per protocol Phase II    PT Home Exercise Plan postural correctin; HEP    Consulted and Agree with Plan of Care Patient         Problem List Patient Active Problem List   Diagnosis Date Noted  . Postoperative hypotension 07/12/2015  . Glenohumeral arthritis 07/10/2015  . Upper airway cough syndrome 07/03/2015  . Fever blister 11/06/2014  . Breath shortness 02/01/2014  . Chronic diastolic heart failure ([place]) 12/25/2013  . Cardiomyopathy ([place]) 12/21/2013  . Pulmonary fibrosis ([place]) 10/16/2013  . TR (tricuspid regurgitation) 10/16/2013  . Congestive heart failure with left ventricular diastolic dysfunction ([place]) 08/27/2013  . Secondary pulmonary hypertension ([place]) 08/27/2013  . CAFL (chronic airflow limitation) ([place]) 08/16/2013  . Acid reflux 08/16/2013  . MI (mitral incompetence) 06/30/2011  . History of recurrent UTI (urinary tract infection) 04/21/2010  . Atrial fibrillation ([place]) 05/13/2009  . OSTEOPOROSIS 05/13/2009  . HERPES ZOSTER 02/28/2009    Derrick Tiegs Nilda Simmer PT, MPH 08/22/2015, 1:30 PM  [facility]  [place], [place], [address] Phone: [phone]   Fax:  [phone]  Name: Terri Small MRN:  [id number] Date of Birth: 07-21-1931

## 2015-08-26 ENCOUNTER — Ambulatory Visit (INDEPENDENT_AMBULATORY_CARE_PROVIDER_SITE_OTHER): Payer: Medicare Other | Admitting: Physical Therapy

## 2015-08-26 DIAGNOSIS — Z7409 Other reduced mobility: Secondary | ICD-10-CM | POA: Diagnosis not present

## 2015-08-26 DIAGNOSIS — M25612 Stiffness of left shoulder, not elsewhere classified: Secondary | ICD-10-CM

## 2015-08-26 DIAGNOSIS — M25512 Pain in left shoulder: Secondary | ICD-10-CM

## 2015-08-26 DIAGNOSIS — R29898 Other symptoms and signs involving the musculoskeletal system: Secondary | ICD-10-CM | POA: Diagnosis not present

## 2015-08-26 DIAGNOSIS — R6889 Other general symptoms and signs: Secondary | ICD-10-CM

## 2015-08-26 DIAGNOSIS — R531 Weakness: Secondary | ICD-10-CM

## 2015-08-26 NOTE — Patient Instructions (Signed)
Flexion (Passive)    Sitting upright, slide forearm forward along table, bending from the waist until a stretch is felt. Hold __5__ seconds. Repeat __10__ times. Do _1-2___ sessions per day.  Internal Rotation (Passive)    Bring hand behind back, using other hand to assist. Hold _5___ seconds. Repeat __10__ times. Do _1___ sessions per day. Or - can perform stick behind back, over buttocks and back down.  ROM: Horizontal Abduction / Adduction - Wand    Keeping both palms down, push right hand across body with other hand. Then pull back across body, keeping arms parallel to floor. Do not allow trunk to twist. Hold ___5_ seconds. Repeat __10__ times per set. Do __1__ sets per session. Do _1-2___ sessions per day.    [facility] [address] [place],  [address]  [phone] (office) [phone] (fax)

## 2015-08-26 NOTE — Therapy (Signed)
[place], [place], [address] Phone: [phone]   Fax:  [phone]  Physical Therapy Treatment  Patient Details  Name: Terri Small MRN: [id number] Date of Birth: 1930/12/15 Referring Provider: Dr. Tania Ade  Encounter Date: 08/26/2015      PT End of Session - 08/26/15 0849    Visit Number 2   Number of Visits 24   Date for PT Re-Evaluation 11/14/15   PT Start Time 0848   PT Stop Time 0946   PT Time Calculation (min) 58 min      Past Medical History  Diagnosis Date  . Atrial fibrillation ([place])     s/p 2x ablation on chronic coumadin, previously on amiodarone but had pulmonary SE.  . Osteoporosis   . Herpes zoster   . Fracture, fibula 11/26/2013  . Fracture, sternum closed 11/26/13  . Fracture of lateral malleolus 11/17/2013  . Dysrhythmia   . CHF (congestive heart failure) ([place])   . Shortness of breath dyspnea   . GERD (gastroesophageal reflux disease)   . Diverticulitis   . Headache   . COPD (chronic obstructive pulmonary disease) [facility])     "pulmonologist told me last week my lungs are good" (07/10/2015)  . Arthritis     "everywhere"    Past Surgical History  Procedure Laterality Date  . Colonoscopy    . Total shoulder arthroplasty Left 07/10/2015  . Joint replacement    . Laparoscopic cholecystectomy  ~ 2009  . Vaginal hysterectomy    . Atrial fibrillation ablation  ~ 2002; ~ 2004  . Cardiac catheterization  ~ 2002; ~ 2004  . Total shoulder arthroplasty Left 07/10/2015    Procedure: LEFT TOTAL SHOULDER ARTHROPLASTY;  Surgeon: Tania Ade, MD;  Location: [place];  Service: Orthopedics;  Laterality: Left;  Left shoulder arthroplasty    There were no vitals filed for this visit.  Visit Diagnosis:  Pain in shoulder region, left  Stiffness of joint, shoulder region, left  Weakness of shoulder  Decreased strength, endurance, and mobility      Subjective Assessment -  08/26/15 0859    Subjective Pt reports she has been performing her HEP.  Nothing new to report since last visit.    Currently in Pain? Yes   Pain Score 1    Pain Location Shoulder   Pain Orientation Left   Pain Descriptors / Indicators Sore   Aggravating Factors  reaching; moving arm in certain directions    Pain Relieving Factors OTC, resting arm            [facility] PT Assessment - 08/26/15 0001    Assessment   Medical Diagnosis Lt shoulder arthroplasty   Referring Provider Dr. Tania Ade   Onset Date/Surgical Date 07/10/15   Hand Dominance Right   Next MD Visit Not set yet   Prior Therapy 8-10 years ago          [facility] Adult PT Treatment/Exercise - 08/26/15 0001    Self-Care   Self-Care Scar Mobilizations   Scar Mobilizations Pt instructed on scar massage; pt able to return demo. (Noted tissue glided well medially/laterally, some puckering with inf/ superior glides)   Shoulder Exercises: Supine   Other Supine Exercises Horizontal add/ abd with cane AAROM x 10    Shoulder Exercises: Seated   Extension AAROM;10 reps  5 sec hold, with cane   External Rotation AAROM;10 reps  with cane, elbow at neutral   Shoulder Exercises: Standing   Internal  Rotation AAROM;10 reps  5 sec hold, with cane.  Then 10 reps with RUE assisting LUE behind back gently.    Modalities   Modalities Financial risk analyst Action IFC   Electrical Stimulation Parameters to tolerance   Electrical Stimulation Goals Pain   Vasopneumatic   Number Minutes Vasopneumatic  15 minutes   Vasopnuematic Location  Shoulder   Vasopneumatic Pressure Low   Vasopneumatic Temperature  3*   Manual Therapy   Manual Therapy Passive ROM;Myofascial release   Manual therapy comments pt supine   Myofascial Release to anterior Lt shoulder (gentle)   Passive ROM Lt shoulder - flexion, abd, and ER to  tolerance.                 PT Education - 08/26/15 0953    Education provided Yes   Education Details HEP    Person(s) Educated Patient   Methods Explanation;Handout;Demonstration   Comprehension Returned demonstration;Verbalized understanding          PT Short Term Goals - 08/22/15 1316    PT SHORT TERM GOAL #1   Title Improve posture and alignment working to strengtheing posterior shoulder girdle musculature 10/02/14   Time 6   Period Weeks   Status New   PT SHORT TERM GOAL #2   Title Patient I in initial HEP 09/19/15   Time 4   Period Weeks   Status New   PT SHORT TERM GOAL #3   Title Patinet to incooporate shoudler exercise to pool exercise program 09/19/15   Time 4   Period Weeks   Status New   PT SHORT TERM GOAL #4   Title Increase AROM Lt shoudler by 10-20 degrees throughout 10/03/15   Time 6   Period Weeks   Status New           PT Long Term Goals - 08/22/15 1321    PT LONG TERM GOAL #1   Title Increase AROM Lt shoulder to [facility] 11/14/15   Time 12   Period Weeks   Status New   PT LONG TERM GOAL #2   Title Functional strength allowing patient to reach up into low cabinet 11/14/15   Time 12   Period Weeks   Status New   PT LONG TERM GOAL #3   Title Use Lt UE for crafts and light household chores 11/14/15   Time 12   Period Weeks   Status New   PT LONG TERM GOAL #4   Title I in HEP for discharge including water exercise 11/14/15   Time 12   Period Weeks   Status New   PT LONG TERM GOAL #5   Title FOTO </= 36% limitation 11/14/15   Time 12   Period Weeks   Status New               Plan - 08/26/15 6440    Clinical Impression Statement Pt tolerated new exercises with minimal increase in pain.  Pt had some guarding with attempts at PROM in flexion. Pt reported that she preferred the ice pack over vaso therapy. Pt progressing towards goals.    Pt will benefit from skilled therapeutic intervention in order to improve on the following  deficits Postural dysfunction;Improper body mechanics;Decreased range of motion;Decreased mobility;Decreased strength;Pain;Increased fascial restricitons;Decreased endurance;Decreased activity tolerance   Rehab Potential Good   PT Frequency 2x / week   PT Duration 12 weeks  PT Treatment/Interventions Patient/family education;ADLs/Self Care Home Management;Therapeutic exercise;Therapeutic activities;Manual techniques;Neuromuscular re-education;Dry needling;Cryotherapy;Electrical Stimulation;Moist Heat;Ultrasound   PT Next Visit Plan progress with ROM and initial strengthening exercises per protocol Phase II    Consulted and Agree with Plan of Care Patient        Problem List Patient Active Problem List   Diagnosis Date Noted  . Postoperative hypotension 07/12/2015  . Glenohumeral arthritis 07/10/2015  . Upper airway cough syndrome 07/03/2015  . Fever blister 11/06/2014  . Breath shortness 02/01/2014  . Chronic diastolic heart failure ([place]) 12/25/2013  . Cardiomyopathy ([place]) 12/21/2013  . Pulmonary fibrosis ([place]) 10/16/2013  . TR (tricuspid regurgitation) 10/16/2013  . Congestive heart failure with left ventricular diastolic dysfunction ([place]) 08/27/2013  . Secondary pulmonary hypertension ([place]) 08/27/2013  . CAFL (chronic airflow limitation) ([place]) 08/16/2013  . Acid reflux 08/16/2013  . MI (mitral incompetence) 06/30/2011  . History of recurrent UTI (urinary tract infection) 04/21/2010  . Atrial fibrillation ([place]) 05/13/2009  . OSTEOPOROSIS 05/13/2009  . HERPES ZOSTER 02/28/2009    Kerin Perna, PTA 08/26/2015 9:56 AM  [facility] [place], [place], [address] Phone: [phone]   Fax:  [phone]  Name: Willamae Demby MRN: [id number] Date of Birth: August 19, 1931

## 2015-08-29 ENCOUNTER — Ambulatory Visit (INDEPENDENT_AMBULATORY_CARE_PROVIDER_SITE_OTHER): Payer: Medicare Other | Admitting: Physical Therapy

## 2015-08-29 DIAGNOSIS — Z7409 Other reduced mobility: Secondary | ICD-10-CM

## 2015-08-29 DIAGNOSIS — M25512 Pain in left shoulder: Secondary | ICD-10-CM | POA: Diagnosis present

## 2015-08-29 DIAGNOSIS — R6889 Other general symptoms and signs: Secondary | ICD-10-CM

## 2015-08-29 DIAGNOSIS — M25612 Stiffness of left shoulder, not elsewhere classified: Secondary | ICD-10-CM | POA: Diagnosis not present

## 2015-08-29 DIAGNOSIS — R29898 Other symptoms and signs involving the musculoskeletal system: Secondary | ICD-10-CM | POA: Diagnosis not present

## 2015-08-29 DIAGNOSIS — R531 Weakness: Secondary | ICD-10-CM

## 2015-08-29 NOTE — Therapy (Signed)
[place], [place], [address] Phone: [phone]   Fax:  [phone]  Physical Therapy Treatment  Patient Details  Name: Terri Small MRN: [id number] Date of Birth: 1931-04-11 Referring Provider: Dr. Tania Ade  Encounter Date: 08/29/2015      PT End of Session - 08/29/15 0933    Visit Number 3   Number of Visits 24   Date for PT Re-Evaluation 11/14/15   PT Start Time [id number]   PT Stop Time 0944   PT Time Calculation (min) 57 min   Activity Tolerance Patient tolerated treatment well      Past Medical History  Diagnosis Date  . Atrial fibrillation ([place])     s/p 2x ablation on chronic coumadin, previously on amiodarone but had pulmonary SE.  . Osteoporosis   . Herpes zoster   . Fracture, fibula 11/26/2013  . Fracture, sternum closed 11/26/13  . Fracture of lateral malleolus 11/17/2013  . Dysrhythmia   . CHF (congestive heart failure) ([place])   . Shortness of breath dyspnea   . GERD (gastroesophageal reflux disease)   . Diverticulitis   . Headache   . COPD (chronic obstructive pulmonary disease) [facility])     "pulmonologist told me last week my lungs are good" (07/10/2015)  . Arthritis     "everywhere"    Past Surgical History  Procedure Laterality Date  . Colonoscopy    . Total shoulder arthroplasty Left 07/10/2015  . Joint replacement    . Laparoscopic cholecystectomy  ~ 2009  . Vaginal hysterectomy    . Atrial fibrillation ablation  ~ 2002; ~ 2004  . Cardiac catheterization  ~ 2002; ~ 2004  . Total shoulder arthroplasty Left 07/10/2015    Procedure: LEFT TOTAL SHOULDER ARTHROPLASTY;  Surgeon: Tania Ade, MD;  Location: [place];  Service: Orthopedics;  Laterality: Left;  Left shoulder arthroplasty    There were no vitals filed for this visit.  Visit Diagnosis:  Pain in shoulder region, left  Stiffness of joint, shoulder region, left  Weakness of shoulder  Decreased strength,  endurance, and mobility      Subjective Assessment - 08/29/15 0851    Subjective Pt reports she had some pain in Lt shoulder yesterday from being "vigorous" with her HEP.  Tylenol in PM relieved symptoms.  Pt encouraged to limit resistance exercises with LUE in water aerobics class.    Currently in Pain? No/denies            [facility] PT Assessment - 08/29/15 0001    Assessment   Medical Diagnosis Lt shoulder arthroplasty   Onset Date/Surgical Date 07/10/15   Hand Dominance Right   ROM / Strength   AROM / PROM / Strength AROM;PROM   AROM   AROM Assessment Site Shoulder   Right/Left Shoulder Left   Left Shoulder Extension 47 Degrees  standing   Left Shoulder Flexion 95 Degrees  standing, 125 in supine   Left Shoulder ABduction 76 Degrees  standing   Left Shoulder External Rotation 37 Degrees  supine, abd ~25 deg.          [place] Adult PT Treatment/Exercise - 08/29/15 0001    Shoulder Exercises: Supine   Flexion --  w/cane for ROM bilat 3-4 sec x5   Other Supine Exercises Horizontal add/ abd with cane AAROM x 3 reps   Shoulder Exercises: Seated   Other Seated Exercises Shoulder shrugs x 5 sec hold x 10 reps, Scap squeezes x 5 sec hold  x 10 reps    Shoulder Exercises: ROM/Strengthening   Other ROM/Strengthening Exercises wall ladder LUE flexion (up to 20#) x 5 reps    Shoulder Exercises: Isometric Strengthening   Flexion 5X5"   Extension 5X5"   External Rotation 5X5"   Internal Rotation 5X5"   ABduction 5X5"   Shoulder Exercises: Stretch   Internal Rotation Stretch --  5 sec hold, 10 reps   Table Stretch - Flexion 5 reps  5 sec   Modalities   Modalities Cryotherapy;Electrical Stimulation   Cryotherapy   Number Minutes Cryotherapy 15 Minutes   Cryotherapy Location Shoulder   Type of Cryotherapy Ice pack   Electrical Stimulation   Electrical Stimulation Location Lt shoulder   Electrical Stimulation Action IFC   Electrical Stimulation Parameters to tolerance    Electrical Stimulation Goals Pain   Manual Therapy   Manual Therapy Passive ROM;Myofascial release   Manual therapy comments pt supine   Myofascial Release to anterior Lt shoulder (gentle)   Passive ROM Lt shoulder - flexion,extension, abd, and ER to tolerance.                 PT Education - 08/29/15 0910    Education provided Yes   Education Details HEP   Person(s) Educated Patient   Methods Explanation;Handout;Demonstration   Comprehension Returned demonstration;Verbalized understanding          PT Short Term Goals - 08/22/15 1316    PT SHORT TERM GOAL #1   Title Improve posture and alignment working to strengtheing posterior shoulder girdle musculature 10/02/14   Time 6   Period Weeks   Status New   PT SHORT TERM GOAL #2   Title Patient I in initial HEP 09/19/15   Time 4   Period Weeks   Status New   PT SHORT TERM GOAL #3   Title Patinet to incooporate shoudler exercise to pool exercise program 09/19/15   Time 4   Period Weeks   Status New   PT SHORT TERM GOAL #4   Title Increase AROM Lt shoudler by 10-20 degrees throughout 10/03/15   Time 6   Period Weeks   Status New           PT Long Term Goals - 08/22/15 1321    PT LONG TERM GOAL #1   Title Increase AROM Lt shoulder to [facility] 11/14/15   Time 12   Period Weeks   Status New   PT LONG TERM GOAL #2   Title Functional strength allowing patient to reach up into low cabinet 11/14/15   Time 12   Period Weeks   Status New   PT LONG TERM GOAL #3   Title Use Lt UE for crafts and light household chores 11/14/15   Time 12   Period Weeks   Status New   PT LONG TERM GOAL #4   Title I in HEP for discharge including water exercise 11/14/15   Time 12   Period Weeks   Status New   PT LONG TERM GOAL #5   Title FOTO </= 36% limitation 11/14/15   Time 12   Period Weeks   Status New               Plan - 08/29/15 0931    Clinical Impression Statement Pt demonstrated improved Lt shoulder ROM this  visit. Pt tolerated all new exercises with minimal increase in pain.  Progressing towards established goals; will benefit from continued PT intervention.    Pt will  benefit from skilled therapeutic intervention in order to improve on the following deficits Postural dysfunction;Improper body mechanics;Decreased range of motion;Decreased mobility;Decreased strength;Pain;Increased fascial restricitons;Decreased endurance;Decreased activity tolerance   Rehab Potential Good   PT Frequency 2x / week   PT Duration 12 weeks   PT Treatment/Interventions Patient/family education;ADLs/Self Care Home Management;Therapeutic exercise;Therapeutic activities;Manual techniques;Neuromuscular re-education;Dry needling;Cryotherapy;Electrical Stimulation;Moist Heat;Ultrasound   PT Next Visit Plan progress with ROM and initial strengthening exercises per protocol Phase II    Consulted and Agree with Plan of Care Patient        Problem List Patient Active Problem List   Diagnosis Date Noted  . Postoperative hypotension 07/12/2015  . Glenohumeral arthritis 07/10/2015  . Upper airway cough syndrome 07/03/2015  . Fever blister 11/06/2014  . Breath shortness 02/01/2014  . Chronic diastolic heart failure ([place]) 12/25/2013  . Cardiomyopathy ([place]) 12/21/2013  . Pulmonary fibrosis ([place]) 10/16/2013  . TR (tricuspid regurgitation) 10/16/2013  . Congestive heart failure with left ventricular diastolic dysfunction ([place]) 08/27/2013  . Secondary pulmonary hypertension ([place]) 08/27/2013  . CAFL (chronic airflow limitation) ([place]) 08/16/2013  . Acid reflux 08/16/2013  . MI (mitral incompetence) 06/30/2011  . History of recurrent UTI (urinary tract infection) 04/21/2010  . Atrial fibrillation ([place]) 05/13/2009  . OSTEOPOROSIS 05/13/2009  . HERPES ZOSTER 02/28/2009    Kerin Perna, PTA 08/29/2015 11:41 AM  [facility] [place], [place],  [address] Phone: [phone]   Fax:  [phone]  Name: Jaimie Guerino MRN: [id number] Date of Birth: 22-Dec-1930

## 2015-08-29 NOTE — Patient Instructions (Signed)
Strengthening: Isometric Flexion  Using wall for resistance, press right fist into ball using light pressure. Hold _5___ seconds. Repeat __10__ times per set. Do _1___ sets per session. Do _1___ sessions per day.  SHOULDER: Abduction (Isometric)  Use wall as resistance. Press arm against pillow. Keep elbow straight. Hold __5_ seconds. __10_ reps per set, __1_ sets per day, _1__ days per week  Extension (Isometric)  Place left bent elbow and back of arm against wall. Press elbow against wall. Hold __5__ seconds. Repeat _10___ times. Do _1___ sessions per day.  Internal Rotation (Isometric)  Place palm of right fist against door frame, with elbow bent. Press fist against door frame. Hold _5___ seconds. Repeat __10__ times. Do __1__ sessions per day.  External Rotation (Isometric)  Place back of left fist against door frame, with elbow bent. Press fist against door frame. Hold _5___ seconds. Repeat _10___ times. Do _1___ sessions per day.   [facility] [place] [facility] [place],  [address]  [phone] (office) [phone] (fax)

## 2015-09-02 ENCOUNTER — Ambulatory Visit (INDEPENDENT_AMBULATORY_CARE_PROVIDER_SITE_OTHER): Payer: Medicare Other | Admitting: Physical Therapy

## 2015-09-02 DIAGNOSIS — R6889 Other general symptoms and signs: Secondary | ICD-10-CM

## 2015-09-02 DIAGNOSIS — M25612 Stiffness of left shoulder, not elsewhere classified: Secondary | ICD-10-CM

## 2015-09-02 DIAGNOSIS — Z7409 Other reduced mobility: Secondary | ICD-10-CM

## 2015-09-02 DIAGNOSIS — R29898 Other symptoms and signs involving the musculoskeletal system: Secondary | ICD-10-CM

## 2015-09-02 DIAGNOSIS — M25512 Pain in left shoulder: Secondary | ICD-10-CM

## 2015-09-02 DIAGNOSIS — R531 Weakness: Secondary | ICD-10-CM

## 2015-09-02 NOTE — Therapy (Signed)
[place], [place], [address] Phone: [phone]   Fax:  [phone]  Physical Therapy Treatment  Patient Details  Name: Terri Small MRN: [id number] Date of Birth: [birth year] Referring Provider: Dr. Tania Ade  Encounter Date: 09/02/2015      PT End of Session - 09/02/15 0937    Visit Number 4   Number of Visits 24   Date for PT Re-Evaluation 11/14/15   PT Start Time 0848   PT Stop Time 0948   PT Time Calculation (min) 60 min   Activity Tolerance Patient tolerated treatment well      Past Medical History  Diagnosis Date  . Atrial fibrillation ([place])     s/p 2x ablation on chronic coumadin, previously on amiodarone but had pulmonary SE.  . Osteoporosis   . Herpes zoster   . Fracture, fibula 11/26/2013  . Fracture, sternum closed 11/26/13  . Fracture of lateral malleolus 11/17/2013  . Dysrhythmia   . CHF (congestive heart failure) ([place])   . Shortness of breath dyspnea   . GERD (gastroesophageal reflux disease)   . Diverticulitis   . Headache   . COPD (chronic obstructive pulmonary disease) [facility])     "pulmonologist told me last week my lungs are good" (07/10/2015)  . Arthritis     "everywhere"    Past Surgical History  Procedure Laterality Date  . Colonoscopy    . Total shoulder arthroplasty Left 07/10/2015  . Joint replacement    . Laparoscopic cholecystectomy  ~ 2009  . Vaginal hysterectomy    . Atrial fibrillation ablation  ~ 2002; ~ 2004  . Cardiac catheterization  ~ 2002; ~ 2004  . Total shoulder arthroplasty Left 07/10/2015    Procedure: LEFT TOTAL SHOULDER ARTHROPLASTY;  Surgeon: Tania Ade, MD;  Location: [place];  Service: Orthopedics;  Laterality: Left;  Left shoulder arthroplasty    There were no vitals filed for this visit.  Visit Diagnosis:  Stiffness of joint, shoulder region, left  Pain in shoulder region, left  Weakness of shoulder  Decreased strength,  endurance, and mobility      Subjective Assessment - 09/02/15 0853    Subjective Pt reports she holds on to the bottom of her swim suit with her LUE so as not to do resistance exercise with it yet.  Otherwise HEP going well, pleased with progress so far.    Currently in Pain? No/denies            [facility] PT Assessment - 09/02/15 0001    Assessment   Medical Diagnosis Lt shoulder arthroplasty   Onset Date/Surgical Date 07/10/15   Hand Dominance Right   Next MD Visit 10/03/15           [facility] Adult PT Treatment/Exercise - 09/02/15 0001    Elbow Exercises   Elbow Flexion Strengthening;Left;15 reps  yellow band   Shoulder Exercises: Standing   External Rotation Left;10 reps;Theraband   Theraband Level (Shoulder External Rotation) Level 1 (Yellow)   Internal Rotation Left;10 reps   Theraband Level (Shoulder Internal Rotation) Level 1 (Yellow)   Internal Rotation Limitations limited, will return to isometrics and AROM   Flexion Left;10 reps;AROM  in front of mirror, noodle behind back. 2 sets   Flexion Limitations to 90 deg per protocol   Shoulder Exercises: Isometric Strengthening   Flexion --  10 reps x 5 sec   Extension --  10 reps x 5 sec   ABduction --  10 reps  x 5 sec   Shoulder Exercises: Stretch   Table Stretch - Flexion 10 seconds  10 reps   Table Stretch - External Rotation 10 seconds  8 reps     Manual therapy:  PROM to Lt shoulder : flexion, abd, ER, ext to tolerance. MFR to anterior shoulder and periscapular muscles of Lt shoulder with pt in supine.   Modalities:    Ice pack -15 min to Lt shoulder for pain (exercise induced)  Electric stim: to Lt shoulder, IFC, to tolerance x 15 min. For Pain reduction.  Education:  Pt issued yellow band to perform elbow flexion (seated) and scap retraction (light row) with bilat UE.  Pt returned demo of proper form with VC.       PT Short Term Goals - 08/22/15 1316    PT SHORT TERM GOAL #1   Title Improve posture  and alignment working to strengtheing posterior shoulder girdle musculature 10/02/14   Time 6   Period Weeks   Status New   PT SHORT TERM GOAL #2   Title Patient I in initial HEP 09/19/15   Time 4   Period Weeks   Status New   PT SHORT TERM GOAL #3   Title Patinet to incooporate shoudler exercise to pool exercise program 09/19/15   Time 4   Period Weeks   Status New   PT SHORT TERM GOAL #4   Title Increase AROM Lt shoudler by 10-20 degrees throughout 10/03/15   Time 6   Period Weeks   Status New           PT Long Term Goals - 08/22/15 1321    PT LONG TERM GOAL #1   Title Increase AROM Lt shoulder to [facility] 11/14/15   Time 12   Period Weeks   Status New   PT LONG TERM GOAL #2   Title Functional strength allowing patient to reach up into low cabinet 11/14/15   Time 12   Period Weeks   Status New   PT LONG TERM GOAL #3   Title Use Lt UE for crafts and light household chores 11/14/15   Time 12   Period Weeks   Status New   PT LONG TERM GOAL #4   Title I in HEP for discharge including water exercise 11/14/15   Time 12   Period Weeks   Status New   PT LONG TERM GOAL #5   Title FOTO </= 36% limitation 11/14/15   Time 12   Period Weeks   Status New               Problem List Patient Active Problem List   Diagnosis Date Noted  . Postoperative hypotension 07/12/2015  . Glenohumeral arthritis 07/10/2015  . Upper airway cough syndrome 07/03/2015  . Fever blister 11/06/2014  . Breath shortness 02/01/2014  . Chronic diastolic heart failure ([place]) 12/25/2013  . Cardiomyopathy ([place]) 12/21/2013  . Pulmonary fibrosis ([place]) 10/16/2013  . TR (tricuspid regurgitation) 10/16/2013  . Congestive heart failure with left ventricular diastolic dysfunction ([place]) 08/27/2013  . Secondary pulmonary hypertension ([place]) 08/27/2013  . CAFL (chronic airflow limitation) ([place]) 08/16/2013  . Acid reflux 08/16/2013  . MI (mitral incompetence) 06/30/2011  . History of recurrent UTI  (urinary tract infection) 04/21/2010  . Atrial fibrillation ([place]) 05/13/2009  . OSTEOPOROSIS 05/13/2009  . HERPES ZOSTER 02/28/2009    Kerin Perna, PTA 09/02/2015 1:06 PM  [place], [place], [address] Phone:  [phone]   Fax:  [phone]  Name: Terri Small MRN: [id number] Date of Birth: 28-Jun-1931

## 2015-09-05 ENCOUNTER — Ambulatory Visit (INDEPENDENT_AMBULATORY_CARE_PROVIDER_SITE_OTHER): Payer: Medicare Other | Admitting: Family Medicine

## 2015-09-05 ENCOUNTER — Ambulatory Visit (INDEPENDENT_AMBULATORY_CARE_PROVIDER_SITE_OTHER): Payer: Medicare Other | Admitting: Rehabilitative and Restorative Service Providers"

## 2015-09-05 ENCOUNTER — Encounter: Payer: Self-pay | Admitting: Rehabilitative and Restorative Service Providers"

## 2015-09-05 VITALS — BP 116/68 | HR 72 | Resp 16

## 2015-09-05 DIAGNOSIS — R29898 Other symptoms and signs involving the musculoskeletal system: Secondary | ICD-10-CM | POA: Diagnosis not present

## 2015-09-05 DIAGNOSIS — M25612 Stiffness of left shoulder, not elsewhere classified: Secondary | ICD-10-CM | POA: Diagnosis present

## 2015-09-05 DIAGNOSIS — R531 Weakness: Secondary | ICD-10-CM

## 2015-09-05 DIAGNOSIS — I482 Chronic atrial fibrillation, unspecified: Secondary | ICD-10-CM

## 2015-09-05 DIAGNOSIS — R6889 Other general symptoms and signs: Secondary | ICD-10-CM

## 2015-09-05 DIAGNOSIS — Z7409 Other reduced mobility: Secondary | ICD-10-CM

## 2015-09-05 DIAGNOSIS — M25512 Pain in left shoulder: Secondary | ICD-10-CM

## 2015-09-05 LAB — POCT INR: INR: 4.5

## 2015-09-05 NOTE — Patient Instructions (Signed)
Ball on Wall: [employer] ball in circles. Do this actively with both hands on ball. Roll 1 min, 1-2 times per day. Roll with left arm active, other arm passive.  With 8-10 inch ball - hold ball between hands and raise and lower ball keeping elbows in  SLOWLY!!!  Move ball up and down,  side to side,  shoulder to hip, hold out and turn hands up and down, hold out go from right to left with wrists  Flexion (Eccentric) - Active-Assist ([place])    With hands lightly holding pulley, raise affected arm with other arm (or push outward during lift). Avoid hiking shoulder. Hold 10 sec 10 reps 2 times per day

## 2015-09-05 NOTE — Therapy (Signed)
[place], [place], [address] Phone: [phone]   Fax:  [phone]  Physical Therapy Treatment  Patient Details  Name: Terri Small MRN: [id number] Date of Birth: 07-09-1931 Referring Provider: Dr. Tania Ade  Encounter Date: 09/05/2015      PT End of Session - 09/05/15 0850    Visit Number 5   Number of Visits 24   Date for PT Re-Evaluation 11/14/15   PT Start Time 0850   PT Stop Time 0943   PT Time Calculation (min) 53 min   Activity Tolerance Patient tolerated treatment well      Past Medical History  Diagnosis Date  . Atrial fibrillation ([place])     s/p 2x ablation on chronic coumadin, previously on amiodarone but had pulmonary SE.  . Osteoporosis   . Herpes zoster   . Fracture, fibula 11/26/2013  . Fracture, sternum closed 11/26/13  . Fracture of lateral malleolus 11/17/2013  . Dysrhythmia   . CHF (congestive heart failure) ([place])   . Shortness of breath dyspnea   . GERD (gastroesophageal reflux disease)   . Diverticulitis   . Headache   . COPD (chronic obstructive pulmonary disease) [facility])     "pulmonologist told me last week my lungs are good" (07/10/2015)  . Arthritis     "everywhere"    Past Surgical History  Procedure Laterality Date  . Colonoscopy    . Total shoulder arthroplasty Left 07/10/2015  . Joint replacement    . Laparoscopic cholecystectomy  ~ 2009  . Vaginal hysterectomy    . Atrial fibrillation ablation  ~ 2002; ~ 2004  . Cardiac catheterization  ~ 2002; ~ 2004  . Total shoulder arthroplasty Left 07/10/2015    Procedure: LEFT TOTAL SHOULDER ARTHROPLASTY;  Surgeon: Tania Ade, MD;  Location: [place];  Service: Orthopedics;  Laterality: Left;  Left shoulder arthroplasty    There were no vitals filed for this visit.  Visit Diagnosis:  Stiffness of joint, shoulder region, left  Pain in shoulder region, left  Weakness of shoulder  Decreased strength,  endurance, and mobility      Subjective Assessment - 09/05/15 0854    Subjective Pt reports stiffness when she wakes up in the am but no pain   Currently in Pain? No/denies                         [facility] Adult PT Treatment/Exercise - 09/05/15 0001    Shoulder Exercises: Standing   External Rotation Left;10 reps;Theraband   Theraband Level (Shoulder External Rotation) Level 1 (Yellow)   Extension Strengthening;Both;20 reps;Theraband   Theraband Level (Shoulder Extension) Level 1 (Yellow)   Row Strengthening;Both;20 reps;Theraband   Theraband Level (Shoulder Row) Level 1 (Yellow)   Retraction Strengthening;Both;20 reps;Theraband   Theraband Level (Shoulder Retraction) Level 1 (Yellow)   Other Standing Exercises scap squeeze w/ noodle 10 sec x10 sitting   Shoulder Exercises: Pulleys   Flexion --  10 sec x 10    ABduction --  scaption 10 sec x 10    Shoulder Exercises: Therapy Ball   Other Therapy Ball Exercises ball on wall stabilization ~8 in ball at waist/chest level 1 min x 2   Other Therapy Ball Exercises ball btn hands for elbow flex/ext; shd flex/ext; diagonals; supination pronation; wrist flexion extension 1 min x3 moving slowly   Shoulder Exercises: Isometric Strengthening   Flexion --  10 reps x 5 sec   Extension --  10 reps x 5 sec   External Rotation --  10 reps x 5 sec   Internal Rotation 5X5"   ABduction 5X5"   Shoulder Exercises: Stretch   Other Shoulder Stretches hands behind head ~ 49min   Modalities   Modalities Electrical Stimulation;Cryotherapy   Cryotherapy   Number Minutes Cryotherapy 15 Minutes   Cryotherapy Location Shoulder   Type of Cryotherapy Ice pack   Electrical Stimulation   Electrical Stimulation Location Lt shoulder   Electrical Stimulation Action IFC   Electrical Stimulation Parameters to tolerance   Electrical Stimulation Goals Pain   Manual Therapy   Manual Therapy Passive ROM;Myofascial release   Manual therapy  comments pt supine   Myofascial Release to anterior shoulder and periscapular muscles   Passive ROM Lt shoulder - flexion, ext, abd, ER                  PT Education - 09/05/15 0925    Education provided Yes   Education Details HEP   Person(s) Educated Patient   Methods Explanation;Demonstration;Tactile cues;Verbal cues;Handout   Comprehension Verbalized understanding;Returned demonstration;Verbal cues required;Tactile cues required          PT Short Term Goals - 08/22/15 1316    PT SHORT TERM GOAL #1   Title Improve posture and alignment working to strengtheing posterior shoulder girdle musculature 10/02/14   Time 6   Period Weeks   Status New   PT SHORT TERM GOAL #2   Title Patient I in initial HEP 09/19/15   Time 4   Period Weeks   Status New   PT SHORT TERM GOAL #3   Title [place] to incooporate shoudler exercise to pool exercise program 09/19/15   Time 4   Period Weeks   Status New   PT SHORT TERM GOAL #4   Title Increase AROM Lt shoudler by 10-20 degrees throughout 10/03/15   Time 6   Period Weeks   Status New           PT Long Term Goals - 08/22/15 1321    PT LONG TERM GOAL #1   Title Increase AROM Lt shoulder to [facility] 11/14/15   Time 12   Period Weeks   Status New   PT LONG TERM GOAL #2   Title Functional strength allowing patient to reach up into low cabinet 11/14/15   Time 12   Period Weeks   Status New   PT LONG TERM GOAL #3   Title Use Lt UE for crafts and light household chores 11/14/15   Time 12   Period Weeks   Status New   PT LONG TERM GOAL #4   Title I in HEP for discharge including water exercise 11/14/15   Time 12   Period Weeks   Status New   PT LONG TERM GOAL #5   Title FOTO </= 36% limitation 11/14/15   Time 12   Period Weeks   Status New               Plan - 09/05/15 0943    Clinical Impression Statement Progressing well with shoulder rehab - continue with stretching and shoulder stabilizationh/strengthening     Pt will benefit from skilled therapeutic intervention in order to improve on the following deficits Postural dysfunction;Improper body mechanics;Decreased range of motion;Decreased mobility;Decreased strength;Pain;Increased fascial restricitons;Decreased endurance;Decreased activity tolerance   Rehab Potential Good   PT Frequency 2x / week   PT Duration 12 weeks   PT Treatment/Interventions Patient/family education;ADLs/Self Care  Home Management;Therapeutic exercise;Therapeutic activities;Manual techniques;Neuromuscular re-education;Dry needling;Cryotherapy;Electrical Stimulation;Moist Heat;Ultrasound   PT Next Visit Plan progress with ROM and initial strengthening exercises per protocol Phase II    PT Home Exercise Plan postural correctin; HEP    Consulted and Agree with Plan of Care Patient        Problem List Patient Active Problem List   Diagnosis Date Noted  . Postoperative hypotension 07/12/2015  . Glenohumeral arthritis 07/10/2015  . Upper airway cough syndrome 07/03/2015  . Fever blister 11/06/2014  . Breath shortness 02/01/2014  . Chronic diastolic heart failure ([place]) 12/25/2013  . Cardiomyopathy ([place]) 12/21/2013  . Pulmonary fibrosis ([place]) 10/16/2013  . TR (tricuspid regurgitation) 10/16/2013  . Congestive heart failure with left ventricular diastolic dysfunction ([place]) 08/27/2013  . Secondary pulmonary hypertension ([place]) 08/27/2013  . CAFL (chronic airflow limitation) ([place]) 08/16/2013  . Acid reflux 08/16/2013  . MI (mitral incompetence) 06/30/2011  . History of recurrent UTI (urinary tract infection) 04/21/2010  . Atrial fibrillation ([place]) 05/13/2009  . OSTEOPOROSIS 05/13/2009  . HERPES ZOSTER 02/28/2009    Damarco Keysor Nilda Simmer PT, MPH 09/05/2015, 9:45 AM  [facility] [place], [place], [address] Phone: [phone]   Fax:  [phone]  Name: Terri Small MRN: [id number] Date of Birth:  20-May-1931

## 2015-09-07 ENCOUNTER — Other Ambulatory Visit: Payer: Self-pay | Admitting: Family Medicine

## 2015-09-08 ENCOUNTER — Ambulatory Visit (INDEPENDENT_AMBULATORY_CARE_PROVIDER_SITE_OTHER): Payer: Medicare Other | Admitting: Family Medicine

## 2015-09-08 ENCOUNTER — Encounter: Payer: Self-pay | Admitting: Family Medicine

## 2015-09-08 VITALS — BP 122/73 | HR 76 | Temp 97.5°F | Resp 16 | Wt 160.4 lb

## 2015-09-08 DIAGNOSIS — N3000 Acute cystitis without hematuria: Secondary | ICD-10-CM | POA: Diagnosis not present

## 2015-09-08 DIAGNOSIS — R300 Dysuria: Secondary | ICD-10-CM

## 2015-09-08 LAB — POCT URINALYSIS DIPSTICK
Bilirubin, UA: NEGATIVE
Blood, UA: NEGATIVE
GLUCOSE UA: NEGATIVE
NITRITE UA: POSITIVE
Protein, UA: NEGATIVE
SPEC GRAV UA: 1.025
UROBILINOGEN UA: 0.2
pH, UA: 5

## 2015-09-08 MED ORDER — CIPROFLOXACIN HCL 500 MG PO TABS: 500.0000 mg | ORAL_TABLET | Freq: Two times a day (BID) | ORAL | Status: AC

## 2015-09-08 NOTE — Progress Notes (Signed)
   Subjective:    Patient ID: Terri Small, female    DOB: [birth year], 81 y.o.   MRN: [id number]  HPI Dysuria x 1 week.  No hematuria or fever or chills. Last  UTI was July and treatedwith [place]ceph[place]. [place]..   Had antibiotics before surgery about 8 weeks ago.   Review of Systems     Objective:   Physical Exam  Constitutional: She is oriented to person, place, and time. She appears well-developed and well-nourished.  HENT:  Head: Normocephalic and atraumatic.  Cardiovascular: Normal rate, regular rhythm and normal heart sounds.   Pulmonary/Chest: Effort normal and breath sounds normal.  Abdominal:  No CVA tenderness.   Neurological: She is alert and oriented to person, place, and time.  Skin: Skin is warm and dry.  Psychiatric: She has a normal mood and affect. Her behavior is normal.          Assessment & Plan:  UTI  - will tx for cipro. Call if not better in one week.  Increase fluids.  U/A pos. Culture not sent. Hold trimethoprim while on Cipro.

## 2015-09-08 NOTE — Patient Instructions (Signed)

## 2015-09-09 ENCOUNTER — Ambulatory Visit (INDEPENDENT_AMBULATORY_CARE_PROVIDER_SITE_OTHER): Payer: Medicare Other | Admitting: Physical Therapy

## 2015-09-09 ENCOUNTER — Ambulatory Visit: Payer: Medicare Other | Admitting: Physician Assistant

## 2015-09-09 DIAGNOSIS — R531 Weakness: Secondary | ICD-10-CM

## 2015-09-09 DIAGNOSIS — Z7409 Other reduced mobility: Secondary | ICD-10-CM

## 2015-09-09 DIAGNOSIS — M25512 Pain in left shoulder: Secondary | ICD-10-CM

## 2015-09-09 DIAGNOSIS — M25612 Stiffness of left shoulder, not elsewhere classified: Secondary | ICD-10-CM

## 2015-09-09 DIAGNOSIS — R29898 Other symptoms and signs involving the musculoskeletal system: Secondary | ICD-10-CM | POA: Diagnosis not present

## 2015-09-09 DIAGNOSIS — R6889 Other general symptoms and signs: Secondary | ICD-10-CM

## 2015-09-09 NOTE — Therapy (Signed)
[place], [place], [address] Phone: [phone]   Fax:  [phone]  Physical Therapy Treatment  Patient Details  Name: Terri Small MRN: [id number] Date of Birth: August 05, 1931 Referring Provider: Dr. Tania Ade  Encounter Date: 09/09/2015      PT End of Session - 09/09/15 0849    Visit Number 6   Number of Visits 24   Date for PT Re-Evaluation 11/14/15   PT Start Time 0846   PT Stop Time 0944   PT Time Calculation (min) 58 min   Activity Tolerance Patient tolerated treatment well      Past Medical History  Diagnosis Date  . Atrial fibrillation ([place])     s/p 2x ablation on chronic coumadin, previously on amiodarone but had pulmonary SE.  . Osteoporosis   . Herpes zoster   . Fracture, fibula 11/26/2013  . Fracture, sternum closed 11/26/13  . Fracture of lateral malleolus 11/17/2013  . Dysrhythmia   . CHF (congestive heart failure) ([place])   . Shortness of breath dyspnea   . GERD (gastroesophageal reflux disease)   . Diverticulitis   . Headache   . COPD (chronic obstructive pulmonary disease) [facility])     "pulmonologist told me last week my lungs are good" (07/10/2015)  . Arthritis     "everywhere"    Past Surgical History  Procedure Laterality Date  . Colonoscopy    . Total shoulder arthroplasty Left 07/10/2015  . Joint replacement    . Laparoscopic cholecystectomy  ~ 2009  . Vaginal hysterectomy    . Atrial fibrillation ablation  ~ 2002; ~ 2004  . Cardiac catheterization  ~ 2002; ~ 2004  . Total shoulder arthroplasty Left 07/10/2015    Procedure: LEFT TOTAL SHOULDER ARTHROPLASTY;  Surgeon: Tania Ade, MD;  Location: [place];  Service: Orthopedics;  Laterality: Left;  Left shoulder arthroplasty    There were no vitals filed for this visit.  Visit Diagnosis:  Stiffness of joint, shoulder region, left  Pain in shoulder region, left  Weakness of shoulder  Decreased strength,  endurance, and mobility          [facility] PT Assessment - 09/09/15 0001    AROM   Left Shoulder Flexion 129 Degrees  supine   Left Shoulder External Rotation 35 Degrees  supine with 50 deg abd          [facility] Adult PT Treatment/Exercise - 09/09/15 0001    Elbow Exercises   Elbow Flexion Strengthening;Left;15 reps  yellow band   Shoulder Exercises: Supine   Protraction AROM;Left;10 reps  2 sets   Shoulder Exercises: Seated   Row Both;10 reps;Theraband   Theraband Level (Shoulder Row) Level 1 (Yellow)  2 sets   Shoulder Exercises: Sidelying   External Rotation AROM;Left;20 reps   External Rotation Limitations unable to tolerate any resistance   Shoulder Exercises: Standing   Internal Rotation Strengthening;Left;10 reps;Theraband   Theraband Level (Shoulder Internal Rotation) Level 1 (Yellow)   Shoulder Exercises: Pulleys   Flexion --  10 sec x 10    ABduction --  scaption 10 sec x 10    Shoulder Exercises: Therapy Ball   Other Therapy Ball Exercises ball on wall stabilization ~8 in ball at waist/chest level 1 min x 2   Modalities   Modalities Electrical Stimulation;Cryotherapy   Cryotherapy   Number Minutes Cryotherapy 15 Minutes   Cryotherapy Location Shoulder   Type of Cryotherapy Ice pack   Electrical Stimulation   Electrical  Stimulation Location Lt shoulder    Electrical Stimulation Action IFC   Electrical Stimulation Parameters to tolerance    Electrical Stimulation Goals Pain   Manual Therapy   Manual Therapy Passive ROM   Manual therapy comments pt supine   Myofascial Release to periscapular muscles.    Passive ROM Lt shoulder - flexion, abd, ER                    PT Short Term Goals - 09/09/15 0939    PT SHORT TERM GOAL #1   Title Improve posture and alignment working to strengtheing posterior shoulder girdle musculature 10/02/14   Time 6   Period Weeks   Status On-going   PT SHORT TERM GOAL #2   Title Patient I in initial HEP 09/19/15    Time 4   Period Weeks   Status On-going   PT SHORT TERM GOAL #3   Title Patient to incooporate shoudler exercise to pool exercise program 09/19/15   Time 4   Period Weeks   Status On-going   PT SHORT TERM GOAL #4   Title Increase AROM Lt shoudler by 10-20 degrees throughout 10/03/15   Time 6   Period Weeks   Status On-going           PT Long Term Goals - 09/09/15 0940    PT LONG TERM GOAL #1   Title Increase AROM Lt shoulder to [facility] 11/14/15   Time 12   Period Weeks   Status On-going   PT LONG TERM GOAL #2   Title Functional strength allowing patient to reach up into low cabinet 11/14/15   Time 12   Period Weeks   Status On-going   PT LONG TERM GOAL #3   Title Use Lt UE for crafts and light household chores 11/14/15   Time 12   Period Weeks   Status On-going   PT LONG TERM GOAL #4   Title I in HEP for discharge including water exercise 11/14/15   Time 12   Period Weeks   Status On-going   PT LONG TERM GOAL #5   Title FOTO </= 36% limitation 11/14/15   Time 12   Period Weeks   Status On-going               Plan - 09/09/15 0933    Clinical Impression Statement Pt demonstrated improved Lt shoulder flexion ROM in supine. Pt requires some cues for good form with exercises, keeping scapula from elevating.  Progressing well towards established goals.     Pt will benefit from skilled therapeutic intervention in order to improve on the following deficits Postural dysfunction;Improper body mechanics;Decreased range of motion;Decreased mobility;Decreased strength;Pain;Increased fascial restricitons;Decreased endurance;Decreased activity tolerance   Rehab Potential Good   PT Frequency 2x / week   PT Duration 12 weeks   PT Treatment/Interventions Patient/family education;ADLs/Self Care Home Management;Therapeutic exercise;Therapeutic activities;Manual techniques;Neuromuscular re-education;Dry needling;Cryotherapy;Electrical Stimulation;Moist Heat;Ultrasound   PT Next Visit  Plan progress with ROM and initial strengthening exercises per protocol Phase II    Consulted and Agree with Plan of Care Patient        Problem List Patient Active Problem List   Diagnosis Date Noted  . Postoperative hypotension 07/12/2015  . Glenohumeral arthritis 07/10/2015  . Upper airway cough syndrome 07/03/2015  . Fever blister 11/06/2014  . Breath shortness 02/01/2014  . Chronic diastolic heart failure ([place]) 12/25/2013  . Cardiomyopathy ([place]) 12/21/2013  . Pulmonary fibrosis ([place]) 10/16/2013  . TR (tricuspid regurgitation)  10/16/2013  . Congestive heart failure with left ventricular diastolic dysfunction ([place]) 08/27/2013  . Secondary pulmonary hypertension ([place]) 08/27/2013  . CAFL (chronic airflow limitation) ([place]) 08/16/2013  . Acid reflux 08/16/2013  . MI (mitral incompetence) 06/30/2011  . History of recurrent UTI (urinary tract infection) 04/21/2010  . Atrial fibrillation ([place]) 05/13/2009  . OSTEOPOROSIS 05/13/2009  . HERPES ZOSTER 02/28/2009   Kerin Perna, PTA 09/09/2015 9:41 AM  [facility] [place], [place], [address] Phone: [phone]   Fax:  [phone]  Name: Jonne Kesecker MRN: [id number] Date of Birth: 12-10-1930

## 2015-09-16 ENCOUNTER — Telehealth: Payer: Self-pay

## 2015-09-16 ENCOUNTER — Ambulatory Visit (INDEPENDENT_AMBULATORY_CARE_PROVIDER_SITE_OTHER): Payer: Medicare Other | Admitting: Physical Therapy

## 2015-09-16 DIAGNOSIS — M25512 Pain in left shoulder: Secondary | ICD-10-CM | POA: Diagnosis not present

## 2015-09-16 DIAGNOSIS — Z7409 Other reduced mobility: Secondary | ICD-10-CM | POA: Diagnosis not present

## 2015-09-16 DIAGNOSIS — R531 Weakness: Secondary | ICD-10-CM

## 2015-09-16 DIAGNOSIS — R29898 Other symptoms and signs involving the musculoskeletal system: Secondary | ICD-10-CM

## 2015-09-16 DIAGNOSIS — R6889 Other general symptoms and signs: Secondary | ICD-10-CM

## 2015-09-16 DIAGNOSIS — M25612 Stiffness of left shoulder, not elsewhere classified: Secondary | ICD-10-CM

## 2015-09-16 MED ORDER — CEPHALEXIN 500 MG PO CAPS: 500.0000 mg | ORAL_CAPSULE | Freq: Three times a day (TID) | ORAL | Status: DC

## 2015-09-16 NOTE — Therapy (Signed)
[place], [place], [address] Phone: [phone]   Fax:  [phone]  Physical Therapy Treatment  Patient Details  Name: Terri Small MRN: [id number] Date of Birth: 1931/06/27 Referring Provider: Dr. Tania Ade  Encounter Date: 09/16/2015      PT End of Session - 09/16/15 0852    Visit Number 7   Number of Visits 24   Date for PT Re-Evaluation 11/14/15   PT Start Time 0849   PT Stop Time 0947   PT Time Calculation (min) 58 min      Past Medical History  Diagnosis Date  . Atrial fibrillation ([place])     s/p 2x ablation on chronic coumadin, previously on amiodarone but had pulmonary SE.  . Osteoporosis   . Herpes zoster   . Fracture, fibula 11/26/2013  . Fracture, sternum closed 11/26/13  . Fracture of lateral malleolus 11/17/2013  . Dysrhythmia   . CHF (congestive heart failure) ([place])   . Shortness of breath dyspnea   . GERD (gastroesophageal reflux disease)   . Diverticulitis   . Headache   . COPD (chronic obstructive pulmonary disease) [facility])     "pulmonologist told me last week my lungs are good" (07/10/2015)  . Arthritis     "everywhere"    Past Surgical History  Procedure Laterality Date  . Colonoscopy    . Total shoulder arthroplasty Left 07/10/2015  . Joint replacement    . Laparoscopic cholecystectomy  ~ 2009  . Vaginal hysterectomy    . Atrial fibrillation ablation  ~ 2002; ~ 2004  . Cardiac catheterization  ~ 2002; ~ 2004  . Total shoulder arthroplasty Left 07/10/2015    Procedure: LEFT TOTAL SHOULDER ARTHROPLASTY;  Surgeon: Tania Ade, MD;  Location: [place];  Service: Orthopedics;  Laterality: Left;  Left shoulder arthroplasty    There were no vitals filed for this visit.  Visit Diagnosis:  Stiffness of joint, shoulder region, left  Pain in shoulder region, left  Weakness of shoulder  Decreased strength, endurance, and mobility      Subjective Assessment -  09/16/15 0851    Subjective Pt reports she had some pain this morning when she woke up but thinks it is from possibly sleeping on arm. Ordered pulleys online last night.    Currently in Pain? No/denies            [facility] PT Assessment - 09/16/15 0001    Assessment   Medical Diagnosis Lt shoulder arthroplasty   Onset Date/Surgical Date 07/10/15   Hand Dominance Right   Next MD Visit 10/03/15         [facility] Adult PT Treatment/Exercise - 09/16/15 0001    Shoulder Exercises: Sidelying   External Rotation AROM;Left;12 reps   Shoulder Exercises: Standing   External Rotation Left  2 reps with yellow; stopped due to pain   Internal Rotation Strengthening;Left;10 reps;Theraband   Theraband Level (Shoulder Internal Rotation) Level 1 (Yellow)   Flexion Left;10 reps;AROM  mirror for visual feedback   Flexion Limitations to 90 deg per protocol   ABduction AAROM;Left;10 reps  to 90 deg scaption in front of mirror, tactile cues to keep shoulder depressed   Extension Strengthening;Both;Theraband;15 reps   Theraband Level (Shoulder Extension) Level 2 (Red)   Row Strengthening;Both;10 reps;Theraband   Theraband Level (Shoulder Row) Level 2 (Red)   Other Standing Exercises scap depression LUE with green band x 15 reps.  Tricep pull down with green band x 15 reps  Shoulder Exercises: Pulleys   Flexion --  10 sec x 10    ABduction --  5 reps. 10 sec hold   Shoulder Exercises: Stretch   Internal Rotation Stretch 5 reps   Modalities   Modalities Electrical Stimulation;Cryotherapy   Cryotherapy   Number Minutes Cryotherapy 15 Minutes   Cryotherapy Location Shoulder   Type of Cryotherapy Ice pack   Electrical Stimulation   Electrical Stimulation Location Lt shoulder    Electrical Stimulation Action IFC   Electrical Stimulation Parameters to tolerance   Electrical Stimulation Goals Pain   Manual Therapy   Manual Therapy Soft tissue mobilization;Passive ROM   Manual therapy comments pt  supine   Soft tissue mobilization to periscapular muscles with TPR to each sensitive area.    Myofascial Release suboccipital release 30 sec x 3 reps   Passive ROM Lt shoulder - flexion, abd, ER                    PT Short Term Goals - 09/09/15 0939    PT SHORT TERM GOAL #1   Title Improve posture and alignment working to strengtheing posterior shoulder girdle musculature 10/02/14   Time 6   Period Weeks   Status On-going   PT SHORT TERM GOAL #2   Title Patient I in initial HEP 09/19/15   Time 4   Period Weeks   Status On-going   PT SHORT TERM GOAL #3   Title Patient to incooporate shoudler exercise to pool exercise program 09/19/15   Time 4   Period Weeks   Status On-going   PT SHORT TERM GOAL #4   Title Increase AROM Lt shoudler by 10-20 degrees throughout 10/03/15   Time 6   Period Weeks   Status On-going           PT Long Term Goals - 09/09/15 0940    PT LONG TERM GOAL #1   Title Increase AROM Lt shoulder to [facility] 11/14/15   Time 12   Period Weeks   Status On-going   PT LONG TERM GOAL #2   Title Functional strength allowing patient to reach up into low cabinet 11/14/15   Time 12   Period Weeks   Status On-going   PT LONG TERM GOAL #3   Title Use Lt UE for crafts and light household chores 11/14/15   Time 12   Period Weeks   Status On-going   PT LONG TERM GOAL #4   Title I in HEP for discharge including water exercise 11/14/15   Time 12   Period Weeks   Status On-going   PT LONG TERM GOAL #5   Title FOTO </= 36% limitation 11/14/15   Time 12   Period Weeks   Status On-going               Plan - 09/16/15 1016    Clinical Impression Statement Pt tolerated increased resistance with rowing and extension for Lt shoulder. Pt requires tactile and visual cues to assist Lt shoulder from elevating with flexion/abd.  Pt continues with limited Lt shoulder ROM and strength; will benefit from continued PT intervention to maximize functional mobility.     Pt will benefit from skilled therapeutic intervention in order to improve on the following deficits Postural dysfunction;Improper body mechanics;Decreased range of motion;Decreased mobility;Decreased strength;Pain;Increased fascial restricitons;Decreased endurance;Decreased activity tolerance   Rehab Potential Good   PT Frequency 2x / week   PT Duration 12 weeks   PT Treatment/Interventions Patient/family education;ADLs/Self  Care Home Management;Therapeutic exercise;Therapeutic activities;Manual techniques;Neuromuscular re-education;Dry needling;Cryotherapy;Electrical Stimulation;Moist Heat;Ultrasound   PT Next Visit Plan progress with ROM and initial strengthening exercises per protocol Phase II    Consulted and Agree with Plan of Care Patient        Problem List Patient Active Problem List   Diagnosis Date Noted  . Postoperative hypotension 07/12/2015  . Glenohumeral arthritis 07/10/2015  . Upper airway cough syndrome 07/03/2015  . Fever blister 11/06/2014  . Breath shortness 02/01/2014  . Chronic diastolic heart failure ([place]) 12/25/2013  . Cardiomyopathy ([place]) 12/21/2013  . Pulmonary fibrosis ([place]) 10/16/2013  . TR (tricuspid regurgitation) 10/16/2013  . Congestive heart failure with left ventricular diastolic dysfunction ([place]) 08/27/2013  . Secondary pulmonary hypertension ([place]) 08/27/2013  . CAFL (chronic airflow limitation) ([place]) 08/16/2013  . Acid reflux 08/16/2013  . MI (mitral incompetence) 06/30/2011  . History of recurrent UTI (urinary tract infection) 04/21/2010  . Atrial fibrillation ([place]) 05/13/2009  . OSTEOPOROSIS 05/13/2009  . HERPES ZOSTER 02/28/2009    Kerin Perna, PTA 09/16/2015 10:19 AM  [facility] [place], [place], [address] Phone: [phone]   Fax:  [phone]  Name: Terri Small MRN: [id number] Date of Birth: [birth year]

## 2015-09-16 NOTE — Telephone Encounter (Signed)
Pt called stating medication is not working for UTI and was wondering if she can just have something called in or if she needs to be seen again.

## 2015-09-19 ENCOUNTER — Ambulatory Visit: Payer: Medicare Other

## 2015-09-19 ENCOUNTER — Ambulatory Visit (INDEPENDENT_AMBULATORY_CARE_PROVIDER_SITE_OTHER): Payer: Medicare Other | Admitting: Physical Therapy

## 2015-09-19 DIAGNOSIS — M25612 Stiffness of left shoulder, not elsewhere classified: Secondary | ICD-10-CM

## 2015-09-19 DIAGNOSIS — Z7409 Other reduced mobility: Secondary | ICD-10-CM | POA: Diagnosis not present

## 2015-09-19 DIAGNOSIS — R531 Weakness: Secondary | ICD-10-CM

## 2015-09-19 DIAGNOSIS — R6889 Other general symptoms and signs: Secondary | ICD-10-CM

## 2015-09-19 DIAGNOSIS — M25512 Pain in left shoulder: Secondary | ICD-10-CM | POA: Diagnosis not present

## 2015-09-19 DIAGNOSIS — R29898 Other symptoms and signs involving the musculoskeletal system: Secondary | ICD-10-CM

## 2015-09-19 NOTE — Therapy (Signed)
[facility] [place], [place], [address] Phone: [phone]   Fax:  [phone]  Physical Therapy Treatment  Patient Details  Name: Terri Small MRN: [id number] Date of Birth: [birth year] Referring Provider: Dr. Tania Ade  Encounter Date: 09/19/2015      PT End of Session - 09/19/15 0855    Visit Number 8   Number of Visits 24   Date for PT Re-Evaluation 11/14/15   PT Start Time 0845   PT Stop Time 0945   PT Time Calculation (min) 60 min   Activity Tolerance Patient tolerated treatment well      Past Medical History  Diagnosis Date  . Atrial fibrillation ([place])     s/p 2x ablation on chronic coumadin, previously on amiodarone but had pulmonary SE.  . Osteoporosis   . Herpes zoster   . Fracture, fibula 11/26/2013  . Fracture, sternum closed 11/26/13  . Fracture of lateral malleolus 11/17/2013  . Dysrhythmia   . CHF (congestive heart failure) ([place])   . Shortness of breath dyspnea   . GERD (gastroesophageal reflux disease)   . Diverticulitis   . Headache   . COPD (chronic obstructive pulmonary disease) [facility])     "pulmonologist told me last week my lungs are good" (07/10/2015)  . Arthritis     "everywhere"    Past Surgical History  Procedure Laterality Date  . Colonoscopy    . Total shoulder arthroplasty Left 07/10/2015  . Joint replacement    . Laparoscopic cholecystectomy  ~ 2009  . Vaginal hysterectomy    . Atrial fibrillation ablation  ~ 2002; ~ 2004  . Cardiac catheterization  ~ 2002; ~ 2004  . Total shoulder arthroplasty Left 07/10/2015    Procedure: LEFT TOTAL SHOULDER ARTHROPLASTY;  Surgeon: Tania Ade, MD;  Location: [facility];  Service: Orthopedics;  Laterality: Left;  Left shoulder arthroplasty    There were no vitals filed for this visit.  Visit Diagnosis:  Stiffness of joint, shoulder region, left  Pain in shoulder region, left  Weakness of shoulder  Decreased strength,  endurance, and mobility      Subjective Assessment - 09/19/15 0855    Subjective Pt continues to have achy feeling in Lt shoulder in morning, but it calms down once she is up and moving.  No new changes since last visit.    Patient Stated Goals full use of Lt arm - to be able to drive and reach into shelf   Currently in Pain? No/denies            [facility] PT Assessment - 09/19/15 0001    Assessment   Medical Diagnosis Lt shoulder arthroplasty   Onset Date/Surgical Date 07/10/15   Hand Dominance Right   Next MD Visit 10/03/15          [facility] Adult PT Treatment/Exercise - 09/19/15 0001    Shoulder Exercises: Supine   External Rotation AAROM;Left;20 reps  with cane   Shoulder Exercises: Pulleys   Flexion -- 3 min    ABduction --  3 min - scaption   Modalities   Modalities Electrical Stimulation;Moist Heat   Moist Heat Therapy   Number Minutes Moist Heat 15 Minutes   Moist Heat Location Shoulder  Lt   Electrical Stimulation   Electrical Stimulation Location Lt shoulder   Electrical Stimulation Action IFC   Electrical Stimulation Parameters to tolerance   Electrical Stimulation Goals Pain   Manual Therapy   Manual Therapy Soft tissue mobilization;Joint mobilization;Scapular  mobilization;Passive ROM;Myofascial release   Manual therapy comments pt supine   Joint Mobilization  Lt GH AP and inf mobs- Grade I and II.    Soft tissue mobilization to periscapular muscles    Myofascial Release to Lt pec    Scapular Mobilization scap mobilizations in all directions with pt in sidelying.    Passive ROM Lt shoulder flexion, scaption, ER (at 45 deg abd), extension         Education:  Self massage MFR with ball to pec area.  Adjusted position for cane supine ER.           PT Short Term Goals - 09/09/15 0939    PT SHORT TERM GOAL #1   Title Improve posture and alignment working to strengtheing posterior shoulder girdle musculature 10/02/14   Time 6   Period Weeks   Status  On-going   PT SHORT TERM GOAL #2   Title Patient I in initial HEP 09/19/15   Time 4   Period Weeks   Status On-going   PT SHORT TERM GOAL #3   Title Patient to incooporate shoudler exercise to pool exercise program 09/19/15   Time 4   Period Weeks   Status On-going   PT SHORT TERM GOAL #4   Title Increase AROM Lt shoudler by 10-20 degrees throughout 10/03/15   Time 6   Period Weeks   Status On-going           PT Long Term Goals - 09/09/15 0940    PT LONG TERM GOAL #1   Title Increase AROM Lt shoulder to [facility] 11/14/15   Time 12   Period Weeks   Status On-going   PT LONG TERM GOAL #2   Title Functional strength allowing patient to reach up into low cabinet 11/14/15   Time 12   Period Weeks   Status On-going   PT LONG TERM GOAL #3   Title Use Lt UE for crafts and light household chores 11/14/15   Time 12   Period Weeks   Status On-going   PT LONG TERM GOAL #4   Title I in HEP for discharge including water exercise 11/14/15   Time 12   Period Weeks   Status On-going   PT LONG TERM GOAL #5   Title FOTO </= 36% limitation 11/14/15   Time 12   Period Weeks   Status On-going               Plan - 09/19/15 0934    Clinical Impression Statement Pt had some guarding with PROM of Lt shoulder.  Tightness in Lt pec with MFR.  Pt continues with limited Lt shoulder ROM.  Progressing towards established goals; has met STG #2.   Pt will benefit from skilled therapeutic intervention in order to improve on the following deficits Postural dysfunction;Improper body mechanics;Decreased range of motion;Decreased mobility;Decreased strength;Pain;Increased fascial restricitons;Decreased endurance;Decreased activity tolerance   Rehab Potential Good   PT Frequency 2x / week   PT Duration 12 weeks   PT Treatment/Interventions Patient/family education;ADLs/Self Care Home Management;Therapeutic exercise;Therapeutic activities;Manual techniques;Neuromuscular re-education;Dry  needling;Cryotherapy;Electrical Stimulation;Moist Heat;Ultrasound   PT Next Visit Plan progress with ROM and initial strengthening exercises per protocol Phase III (now 10 wks post op)   Consulted and Agree with Plan of Care Patient        Problem List Patient Active Problem List   Diagnosis Date Noted  . Postoperative hypotension 07/12/2015  . Glenohumeral arthritis 07/10/2015  . Upper airway cough syndrome 07/03/2015  .  Fever blister 11/06/2014  . Breath shortness 02/01/2014  . Chronic diastolic heart failure ([place]) 12/25/2013  . Cardiomyopathy ([place]) 12/21/2013  . Pulmonary fibrosis ([place]) 10/16/2013  . TR (tricuspid regurgitation) 10/16/2013  . Congestive heart failure with left ventricular diastolic dysfunction ([place]) 08/27/2013  . Secondary pulmonary hypertension ([place]) 08/27/2013  . CAFL (chronic airflow limitation) ([place]) 08/16/2013  . Acid reflux 08/16/2013  . MI (mitral incompetence) 06/30/2011  . History of recurrent UTI (urinary tract infection) 04/21/2010  . Atrial fibrillation ([place]) 05/13/2009  . OSTEOPOROSIS 05/13/2009  . HERPES ZOSTER 02/28/2009    Kerin Perna, PTA 09/19/2015 9:43 AM  [facility] [place], [place], [address] Phone: [phone]   Fax:  [phone]  Name: Terri Small MRN: [id number] Date of Birth: [birth year]

## 2015-09-23 ENCOUNTER — Other Ambulatory Visit: Payer: Self-pay | Admitting: Family Medicine

## 2015-09-23 ENCOUNTER — Ambulatory Visit (INDEPENDENT_AMBULATORY_CARE_PROVIDER_SITE_OTHER): Payer: Medicare Other | Admitting: Rehabilitative and Restorative Service Providers"

## 2015-09-23 ENCOUNTER — Encounter: Payer: Self-pay | Admitting: Rehabilitative and Restorative Service Providers"

## 2015-09-23 DIAGNOSIS — M25512 Pain in left shoulder: Secondary | ICD-10-CM | POA: Diagnosis not present

## 2015-09-23 DIAGNOSIS — R29898 Other symptoms and signs involving the musculoskeletal system: Secondary | ICD-10-CM

## 2015-09-23 DIAGNOSIS — R6889 Other general symptoms and signs: Secondary | ICD-10-CM

## 2015-09-23 DIAGNOSIS — M25612 Stiffness of left shoulder, not elsewhere classified: Secondary | ICD-10-CM | POA: Diagnosis not present

## 2015-09-23 DIAGNOSIS — Z7409 Other reduced mobility: Secondary | ICD-10-CM | POA: Diagnosis not present

## 2015-09-23 DIAGNOSIS — R531 Weakness: Secondary | ICD-10-CM

## 2015-09-23 DIAGNOSIS — I482 Chronic atrial fibrillation, unspecified: Secondary | ICD-10-CM

## 2015-09-23 NOTE — Patient Instructions (Signed)
Resisted External Rotation: in Neutral - Bilateral   PALMS UP Sit or stand, tubing in both hands, elbows at sides, bent to 90, forearms forward. Pinch shoulder blades together and rotate forearms out. Keep elbows at sides. Repeat __10__ times per set. Do _2-3___ sets per session. Do _2-3___ sessions per day.   Strengthening: Scaption - with External Rotation    Holding _0 to 1___ pound weight, raise right arm diagonally from hip to above head. Keep elbow straight, thumb up. Repeat _10___ times per set. Do _1-3___ sets per session. Do __1-2__ sessions per day.   .  Strengthening: Resisted Flexion   Squeeze shoulder blades down and back. Lift right arm up to shoulder height or just below. Use 1# weight Lift forward and up. Move shoulder through pain-free range of motion. Repeat __10__ times per set. Do _1-2___ sets per session. Do _1-2___ sessions per day.    External Rotator Cuff Stretch, Standing    Stand, hands clasped behind back. Pull elbows back as far as possible. Hold _5__ seconds. Repeat __5_ times per session. Do __1-2_ sessions per day.  Copyright  VHI. All rights reserved.

## 2015-09-23 NOTE — Therapy (Signed)
[place], [place], [address] Phone: [phone]   Fax:  [phone]  Physical Therapy Treatment  Patient Details  Name: Terri Small MRN: [id number] Date of Birth: May 13, 1931 Referring Provider: Tania Ade   Encounter Date: 09/23/2015      PT End of Session - 09/23/15 0842    Visit Number 9   Number of Visits 24   Date for PT Re-Evaluation 11/14/15   PT Start Time 0842   PT Stop Time 0941   PT Time Calculation (min) 59 min   Activity Tolerance Patient tolerated treatment well      Past Medical History  Diagnosis Date  . Atrial fibrillation ([place])     s/p 2x ablation on chronic coumadin, previously on amiodarone but had pulmonary SE.  . Osteoporosis   . Herpes zoster   . Fracture, fibula 11/26/2013  . Fracture, sternum closed 11/26/13  . Fracture of lateral malleolus 11/17/2013  . Dysrhythmia   . CHF (congestive heart failure) ([place])   . Shortness of breath dyspnea   . GERD (gastroesophageal reflux disease)   . Diverticulitis   . Headache   . COPD (chronic obstructive pulmonary disease) [facility])     "pulmonologist told me last week my lungs are good" (07/10/2015)  . Arthritis     "everywhere"    Past Surgical History  Procedure Laterality Date  . Colonoscopy    . Total shoulder arthroplasty Left 07/10/2015  . Joint replacement    . Laparoscopic cholecystectomy  ~ 2009  . Vaginal hysterectomy    . Atrial fibrillation ablation  ~ 2002; ~ 2004  . Cardiac catheterization  ~ 2002; ~ 2004  . Total shoulder arthroplasty Left 07/10/2015    Procedure: LEFT TOTAL SHOULDER ARTHROPLASTY;  Surgeon: Tania Ade, MD;  Location: [facility];  Service: Orthopedics;  Laterality: Left;  Left shoulder arthroplasty    There were no vitals filed for this visit.  Visit Diagnosis:  Stiffness of joint, shoulder region, left  Pain in shoulder region, left  Weakness of shoulder  Decreased strength,  endurance, and mobility      Subjective Assessment - 09/23/15 0842    Currently in Pain? Yes   Pain Score 2    Pain Location Shoulder   Pain Orientation Left   Pain Descriptors / Indicators --  Stiffness   Pain Type Chronic pain   Pain Onset More than a month ago   Pain Frequency Intermittent   Aggravating Factors  reacihing; using arm moving in certain directions   Pain Relieving Factors OTC meds; rest            [facility] PT Assessment - 09/23/15 0001    Assessment   Medical Diagnosis Lt shoulder arthroplasty   Referring Provider Tania Ade    Onset Date/Surgical Date 07/10/15   Hand Dominance Right   Next MD Visit 10/03/15   PROM   Left Shoulder Flexion 126 Degrees   Left Shoulder ABduction 123 Degrees   Left Shoulder Internal Rotation 45 Degrees   Left Shoulder External Rotation 31 Degrees                     [facility] Adult PT Treatment/Exercise - 09/23/15 0001    Shoulder Exercises: Standing   External Rotation Both;10 reps;Theraband   Theraband Level (Shoulder External Rotation) Level 1 (Yellow)   Internal Rotation Strengthening;Left;10 reps;Theraband   Theraband Level (Shoulder Internal Rotation) Level 1 (Yellow)   Flexion Left;10 reps  Shoulder Flexion Weight (lbs) 1#   Flexion Limitations to 90 deg per protocol   Extension Strengthening;Both;Theraband;15 reps   Theraband Level (Shoulder Extension) Level 2 (Red)   Row Strengthening;Both;10 reps;Theraband   Theraband Level (Shoulder Row) Level 2 (Red)   Other Standing Exercises scaption bilat = active with noodle 5 sec x 10 (trial of 1# created discomfort and compensatory mvt Lt shd girdle   Other Standing Exercises scap squeeze with noodle 10 sec x 10    Shoulder Exercises: Pulleys   Flexion --  10 sec x 10    ABduction --  5 reps. 10 sec hold, scaption   Shoulder Exercises: Therapy Ball   Other Therapy Ball Exercises ball on wall stabilization ~8 in ball at waist/chest level 1 min x 2    Other Therapy Ball Exercises ball btn hands for elbow flex/ext; shd flex/ext; diagonals; supination pronation; wrist flexion extension 1 min x3 moving slowly   Shoulder Exercises: Stretch   External Rotation Stretch 10 seconds  10 reps with cane in standing elbow at side    Other Shoulder Stretches hands clasp at waist pulling elbows back 5 sec hold x 5   Modalities   Modalities Electrical Stimulation;Moist Heat   Moist Heat Therapy   Number Minutes Moist Heat 15 Minutes   Moist Heat Location Shoulder  Lt   Electrical Stimulation   Electrical Stimulation Location Lt shoulder   Electrical Stimulation Action IFC   Electrical Stimulation Parameters to tolerance   Electrical Stimulation Goals Pain   Manual Therapy   Manual Therapy Soft tissue mobilization;Joint mobilization;Scapular mobilization;Passive ROM;Myofascial release   Manual therapy comments pt supine   Soft tissue mobilization to periscapular muscles    Myofascial Release to Lt pec                 PT Education - 09/23/15 0933    Education provided Yes   Education Details HEP   Person(s) Educated Patient   Methods Explanation;Demonstration;Tactile cues;Verbal cues;Handout   Comprehension Verbalized understanding;Returned demonstration;Verbal cues required;Tactile cues required          PT Short Term Goals - 09/19/15 0944    PT SHORT TERM GOAL #1   Title Improve posture and alignment working to strengtheing posterior shoulder girdle musculature 10/02/14   Time 6   Period Weeks   Status On-going   PT SHORT TERM GOAL #2   Title Patient I in initial HEP 09/19/15   Time 4   Period Weeks   Status Achieved   PT SHORT TERM GOAL #3   Title Patient to incorporate shoulder exercise to pool exercise program 09/19/15   Time 4   Period Weeks   Status On-going   PT SHORT TERM GOAL #4   Title Increase AROM Lt shoudler by 10-20 degrees throughout 10/03/15   Time 6   Period Weeks   Status On-going           PT  Long Term Goals - 09/09/15 0940    PT LONG TERM GOAL #1   Title Increase AROM Lt shoulder to [facility] 11/14/15   Time 12   Period Weeks   Status On-going   PT LONG TERM GOAL #2   Title Functional strength allowing patient to reach up into low cabinet 11/14/15   Time 12   Period Weeks   Status On-going   PT LONG TERM GOAL #3   Title Use Lt UE for crafts and light household chores 11/14/15   Time 12  Period Weeks   Status On-going   PT LONG TERM GOAL #4   Title I in HEP for discharge including water exercise 11/14/15   Time 12   Period Weeks   Status On-going   PT LONG TERM GOAL #5   Title FOTO </= 36% limitation 11/14/15   Time 12   Period Weeks   Status On-going               Plan - 09/23/15 1028    Clinical Impression Statement Continues to improve gradually. Can reach her microwave door now. Continues to have muscular tightness and myofacial restrictions through Lt shoulder girdle. Needs cont work on ROM and strength. Progressing toward stated goals of therapy.    Pt will benefit from skilled therapeutic intervention in order to improve on the following deficits Postural dysfunction;Improper body mechanics;Decreased range of motion;Decreased mobility;Decreased strength;Pain;Increased fascial restricitons;Decreased endurance;Decreased activity tolerance   Rehab Potential Good   PT Frequency 2x / week   PT Duration 12 weeks   PT Treatment/Interventions Patient/family education;ADLs/Self Care Home Management;Therapeutic exercise;Therapeutic activities;Manual techniques;Neuromuscular re-education;Dry needling;Cryotherapy;Electrical Stimulation;Moist Heat;Ultrasound   PT Next Visit Plan progress with ROM and initial strengthening exercises per protocol Phase III (now 10 wks post op)   PT Home Exercise Plan postural correctin; HEP; progress with strengthening gradually as tolerated    Consulted and Agree with Plan of Care Patient        Problem List Patient Active Problem  List   Diagnosis Date Noted  . Postoperative hypotension 07/12/2015  . Glenohumeral arthritis 07/10/2015  . Upper airway cough syndrome 07/03/2015  . Fever blister 11/06/2014  . Breath shortness 02/01/2014  . Chronic diastolic heart failure ([place]) 12/25/2013  . Cardiomyopathy ([place]) 12/21/2013  . Pulmonary fibrosis ([place]) 10/16/2013  . TR (tricuspid regurgitation) 10/16/2013  . Congestive heart failure with left ventricular diastolic dysfunction ([place]) 08/27/2013  . Secondary pulmonary hypertension ([place]) 08/27/2013  . CAFL (chronic airflow limitation) ([place]) 08/16/2013  . Acid reflux 08/16/2013  . MI (mitral incompetence) 06/30/2011  . History of recurrent UTI (urinary tract infection) 04/21/2010  . Atrial fibrillation ([place]) 05/13/2009  . OSTEOPOROSIS 05/13/2009  . HERPES ZOSTER 02/28/2009    Maricruz Lucero Nilda Simmer PT, MPH 09/23/2015, 10:36 AM  [facility] [place], [place], [address] Phone: [phone]   Fax:  [phone]  Name: Terri Small MRN: [id number] Date of Birth: 03-11-1931

## 2015-09-24 LAB — PROTIME-INR

## 2015-09-26 ENCOUNTER — Ambulatory Visit (INDEPENDENT_AMBULATORY_CARE_PROVIDER_SITE_OTHER): Payer: Medicare Other | Admitting: Physical Therapy

## 2015-09-26 ENCOUNTER — Telehealth: Payer: Self-pay

## 2015-09-26 ENCOUNTER — Other Ambulatory Visit: Payer: Self-pay | Admitting: Family Medicine

## 2015-09-26 DIAGNOSIS — M25512 Pain in left shoulder: Secondary | ICD-10-CM | POA: Diagnosis not present

## 2015-09-26 DIAGNOSIS — M25612 Stiffness of left shoulder, not elsewhere classified: Secondary | ICD-10-CM | POA: Diagnosis present

## 2015-09-26 DIAGNOSIS — Z7409 Other reduced mobility: Secondary | ICD-10-CM

## 2015-09-26 DIAGNOSIS — R29898 Other symptoms and signs involving the musculoskeletal system: Secondary | ICD-10-CM | POA: Diagnosis not present

## 2015-09-26 DIAGNOSIS — R531 Weakness: Secondary | ICD-10-CM

## 2015-09-26 DIAGNOSIS — R6889 Other general symptoms and signs: Secondary | ICD-10-CM

## 2015-09-26 LAB — PROTIME-INR
INR: 2.62 — ABNORMAL HIGH (ref <1.50)
PROTHROMBIN TIME: 28.2 s — AB (ref 11.6–15.2)

## 2015-09-26 NOTE — Telephone Encounter (Signed)
Patient aware of INR results and recommendations.

## 2015-09-26 NOTE — Telephone Encounter (Signed)
-----   Message from Hali Marry, MD sent at 09/26/2015  1:01 PM EST ----- Call patient: INR looks great at 2.6. Continue current regimen and recheck in 3-4 weeks.

## 2015-09-26 NOTE — Therapy (Signed)
[place], [place], [address] Phone: [phone]   Fax:  [phone]  Physical Therapy Treatment  Patient Details  Name: Terri Small MRN: [id number] Date of Birth: September 12, 1931 Referring Provider: Tania Ade   Encounter Date: 09/26/2015      PT End of Session - 09/26/15 1003    Visit Number 10   Number of Visits 24   Date for PT Re-Evaluation 11/14/15   PT Start Time 0943  Pt arrived late   PT Stop Time 1028   PT Time Calculation (min) 45 min      Past Medical History  Diagnosis Date  . Atrial fibrillation ([place])     s/p 2x ablation on chronic coumadin, previously on amiodarone but had pulmonary SE.  . Osteoporosis   . Herpes zoster   . Fracture, fibula 11/26/2013  . Fracture, sternum closed 11/26/13  . Fracture of lateral malleolus 11/17/2013  . Dysrhythmia   . CHF (congestive heart failure) ([place])   . Shortness of breath dyspnea   . GERD (gastroesophageal reflux disease)   . Diverticulitis   . Headache   . COPD (chronic obstructive pulmonary disease) [facility])     "pulmonologist told me last week my lungs are good" (07/10/2015)  . Arthritis     "everywhere"    Past Surgical History  Procedure Laterality Date  . Colonoscopy    . Total shoulder arthroplasty Left 07/10/2015  . Joint replacement    . Laparoscopic cholecystectomy  ~ 2009  . Vaginal hysterectomy    . Atrial fibrillation ablation  ~ 2002; ~ 2004  . Cardiac catheterization  ~ 2002; ~ 2004  . Total shoulder arthroplasty Left 07/10/2015    Procedure: LEFT TOTAL SHOULDER ARTHROPLASTY;  Surgeon: Tania Ade, MD;  Location: [place];  Service: Orthopedics;  Laterality: Left;  Left shoulder arthroplasty    There were no vitals filed for this visit.  Visit Diagnosis:  Stiffness of joint, shoulder region, left  Pain in shoulder region, left  Weakness of shoulder  Decreased strength, endurance, and mobility      Subjective  Assessment - 09/26/15 0949    Subjective Pt arrived late; thought appt was at 9:45.  Pt reports she is having a little more pain with current exercises at home ("a deep ache of 5/10") but resolves with rest.    Currently in Pain? No/denies            [facility] PT Assessment - 09/26/15 0001    Assessment   Medical Diagnosis Lt shoulder arthroplasty   Onset Date/Surgical Date 07/10/15   Hand Dominance Right   Next MD Visit 10/03/15   Observation/Other Assessments   Focus on Therapeutic Outcomes (FOTO)  42% limited            [facility] Adult PT Treatment/Exercise - 09/26/15 0001    Shoulder Exercises: Standing   External Rotation 20 reps;Both   Theraband Level (Shoulder External Rotation) Level 1 (Yellow)  mirror for visual feedback   Internal Rotation Strengthening;Left;10 reps;Theraband   Theraband Level (Shoulder Internal Rotation) Level 1 (Yellow)   Internal Rotation Weight (lbs) tactile cues for improved form.    Flexion Left;10 reps   Shoulder Flexion Weight (lbs) 1#   Flexion Limitations to 90 deg per protocol   Extension Both;10 reps;Theraband;Strengthening   Theraband Level (Shoulder Extension) Level 2 (Red)  2 sets   Row Strengthening;Both;10 reps;Theraband  2 sets   Theraband Level (Shoulder Row) Level 2 (Red)  Shoulder Exercises: Pulleys   Flexion --  10 sec x 10    ABduction --  5 reps. 10 sec hold, scaption   Other Pulley Exercises IR   to tolerance, 10 reps at 10 sec hold.   Shoulder Exercises: Therapy Ball   Other Therapy Ball Exercises Orange therapy ball btn hands for elbow flex/ext x 5 reps; repeated with small blue ball with horizontal abd/add, elbow flexion/ext, and supination/pronation with arms flexed ~80 deg   Modalities   Modalities Electrical Stimulation;Moist Heat   Moist Heat Therapy   Number Minutes Moist Heat 15 Minutes   Moist Heat Location Shoulder  Lt   Electrical Stimulation   Electrical Stimulation Location Lt shoulder    Electrical  Stimulation Action IFC   Electrical Stimulation Parameters to tolerance    Electrical Stimulation Goals Pain            PT Short Term Goals - 09/19/15 0944    PT SHORT TERM GOAL #1   Title Improve posture and alignment working to strengtheing posterior shoulder girdle musculature 10/02/14   Time 6   Period Weeks   Status On-going   PT SHORT TERM GOAL #2   Title Patient I in initial HEP 09/19/15   Time 4   Period Weeks   Status Achieved   PT SHORT TERM GOAL #3   Title Patient to incorporate shoulder exercise to pool exercise program 09/19/15   Time 4   Period Weeks   Status On-going   PT SHORT TERM GOAL #4   Title Increase AROM Lt shoudler by 10-20 degrees throughout 10/03/15   Time 6   Period Weeks   Status On-going           PT Long Term Goals - 09/09/15 0940    PT LONG TERM GOAL #1   Title Increase AROM Lt shoulder to [facility] 11/14/15   Time 12   Period Weeks   Status On-going   PT LONG TERM GOAL #2   Title Functional strength allowing patient to reach up into low cabinet 11/14/15   Time 12   Period Weeks   Status On-going   PT LONG TERM GOAL #3   Title Use Lt UE for crafts and light household chores 11/14/15   Time 12   Period Weeks   Status On-going   PT LONG TERM GOAL #4   Title I in HEP for discharge including water exercise 11/14/15   Time 12   Period Weeks   Status On-going   PT LONG TERM GOAL #5   Title FOTO </= 36% limitation 11/14/15   Time 12   Period Weeks   Status On-going               Plan - 09/26/15 1257    Clinical Impression Statement Pt tolerated all exercises well, mild increase in Lt shoulder pain.  Pain reduced with use of estim and MHP at end of session.  Pt continues to make good gains towards established goals and will benefit from continued PT intervention to maximize functional mobility and independence.    Pt will benefit from skilled therapeutic intervention in order to improve on the following deficits Postural  dysfunction;Improper body mechanics;Decreased range of motion;Decreased mobility;Decreased strength;Pain;Increased fascial restricitons;Decreased endurance;Decreased activity tolerance   Rehab Potential Good   PT Frequency 2x / week   PT Duration 12 weeks   PT Treatment/Interventions Patient/family education;ADLs/Self Care Home Management;Therapeutic exercise;Therapeutic activities;Manual techniques;Neuromuscular re-education;Dry needling;Cryotherapy;Electrical Stimulation;Moist Heat;Ultrasound   PT Next Visit  Plan progress with ROM and initial strengthening exercises per protocol Phase III (now 10 wks post op)   Consulted and Agree with Plan of Care Patient        Problem List Patient Active Problem List   Diagnosis Date Noted  . Postoperative hypotension 07/12/2015  . Glenohumeral arthritis 07/10/2015  . Upper airway cough syndrome 07/03/2015  . Fever blister 11/06/2014  . Breath shortness 02/01/2014  . Chronic diastolic heart failure ([place]) 12/25/2013  . Cardiomyopathy ([place]) 12/21/2013  . Pulmonary fibrosis ([place]) 10/16/2013  . TR (tricuspid regurgitation) 10/16/2013  . Congestive heart failure with left ventricular diastolic dysfunction ([place]) 08/27/2013  . Secondary pulmonary hypertension ([place]) 08/27/2013  . CAFL (chronic airflow limitation) ([place]) 08/16/2013  . Acid reflux 08/16/2013  . MI (mitral incompetence) 06/30/2011  . History of recurrent UTI (urinary tract infection) 04/21/2010  . Atrial fibrillation ([place]) 05/13/2009  . OSTEOPOROSIS 05/13/2009  . HERPES ZOSTER 02/28/2009    Kerin Perna, PTA 09/26/2015 1:13 PM  [place], [place], [address] Phone: [phone]   Fax:  [phone]  Name: Terri Small MRN: [id number] Date of Birth: 1931-03-16

## 2015-09-30 ENCOUNTER — Encounter: Payer: Self-pay | Admitting: Rehabilitative and Restorative Service Providers"

## 2015-09-30 ENCOUNTER — Ambulatory Visit (INDEPENDENT_AMBULATORY_CARE_PROVIDER_SITE_OTHER): Payer: Medicare Other | Admitting: Rehabilitative and Restorative Service Providers"

## 2015-09-30 DIAGNOSIS — Z7409 Other reduced mobility: Secondary | ICD-10-CM

## 2015-09-30 DIAGNOSIS — M25612 Stiffness of left shoulder, not elsewhere classified: Secondary | ICD-10-CM

## 2015-09-30 DIAGNOSIS — R531 Weakness: Secondary | ICD-10-CM

## 2015-09-30 DIAGNOSIS — M25512 Pain in left shoulder: Secondary | ICD-10-CM | POA: Diagnosis not present

## 2015-09-30 DIAGNOSIS — R6889 Other general symptoms and signs: Secondary | ICD-10-CM

## 2015-09-30 DIAGNOSIS — R29898 Other symptoms and signs involving the musculoskeletal system: Secondary | ICD-10-CM

## 2015-09-30 NOTE — Patient Instructions (Signed)
External Rotator Cuff Stretch, Standing    Stand, palm against door frame and elbow bent at 90 (or a little lower). Turn body away from fixed hand allowing shoulder to come forward. Hold _30__ seconds.  Repeat 3___ times per session. Do __2-3_ sessions per day.

## 2015-09-30 NOTE — Therapy (Signed)
[place], [place], [address] Phone: [phone]   Fax:  [phone]  Physical Therapy Treatment  Patient Details  Name: Terri Small MRN: [id number] Date of Birth: [birth year] Referring Provider: Tania Ade  Encounter Date: 09/30/2015      PT End of Session - 09/30/15 0936    Visit Number 11   Number of Visits 24   Date for PT Re-Evaluation 11/14/15   PT Start Time 0931   PT Stop Time 1017   PT Time Calculation (min) 46 min   Activity Tolerance Patient tolerated treatment well      Past Medical History  Diagnosis Date  . Atrial fibrillation ([place])     s/p 2x ablation on chronic coumadin, previously on amiodarone but had pulmonary SE.  . Osteoporosis   . Herpes zoster   . Fracture, fibula 11/26/2013  . Fracture, sternum closed 11/26/13  . Fracture of lateral malleolus 11/17/2013  . Dysrhythmia   . CHF (congestive heart failure) ([place])   . Shortness of breath dyspnea   . GERD (gastroesophageal reflux disease)   . Diverticulitis   . Headache   . COPD (chronic obstructive pulmonary disease) [facility])     "pulmonologist told me last week my lungs are good" (07/10/2015)  . Arthritis     "everywhere"    Past Surgical History  Procedure Laterality Date  . Colonoscopy    . Total shoulder arthroplasty Left 07/10/2015  . Joint replacement    . Laparoscopic cholecystectomy  ~ 2009  . Vaginal hysterectomy    . Atrial fibrillation ablation  ~ 2002; ~ 2004  . Cardiac catheterization  ~ 2002; ~ 2004  . Total shoulder arthroplasty Left 07/10/2015    Procedure: LEFT TOTAL SHOULDER ARTHROPLASTY;  Surgeon: Tania Ade, MD;  Location: [place];  Service: Orthopedics;  Laterality: Left;  Left shoulder arthroplasty    There were no vitals filed for this visit.  Visit Diagnosis:  Stiffness of joint, shoulder region, left  Pain in shoulder region, left  Weakness of shoulder  Decreased strength,  endurance, and mobility      Subjective Assessment - 09/30/15 0937    Subjective Using Lt UE for more functional activities - can open the microwave door which is above the oven.    Currently in Pain? No/denies            [facility] PT Assessment - 09/30/15 0001    Assessment   Medical Diagnosis Lt shoulder arthroplasty   Referring Provider Tania Ade   Onset Date/Surgical Date 07/10/15   Hand Dominance Right   Next MD Visit 10/03/15   Observation/Other Assessments   Focus on Therapeutic Outcomes (FOTO)  42% limited    AROM   Overall AROM  --  standing; elevation of shd girdle/patterns of substitution    Left Shoulder Flexion 112 Degrees   Left Shoulder ABduction 93 Degrees   Left Shoulder Internal Rotation 27 Degrees   Left Shoulder External Rotation 74 Degrees   PROM   Left Shoulder Flexion 126 Degrees   Left Shoulder ABduction 123 Degrees   Left Shoulder Internal Rotation 45 Degrees   Left Shoulder External Rotation 31 Degrees                     [facility] Adult PT Treatment/Exercise - 09/30/15 0001    Shoulder Exercises: Standing   External Rotation 20 reps;Both   Theraband Level (Shoulder External Rotation) Level 1 (Yellow)  mirror for visual  feedback   Internal Rotation Strengthening;Left;10 reps;Theraband   Theraband Level (Shoulder Internal Rotation) Level 1 (Yellow)   Flexion Left;10 reps   Shoulder Flexion Weight (lbs) 1#   Flexion Limitations to 90 deg per protocol   Extension Both;10 reps;Theraband;Strengthening   Theraband Level (Shoulder Extension) Level 3 (Green)  2 sets   Row Strengthening;Both;10 reps;Theraband  2 sets   Theraband Level (Shoulder Row) Level 3 (Green)   Retraction Strengthening;Both;20 reps;Theraband   Theraband Level (Shoulder Retraction) Level 1 (Yellow)   Other Standing Exercises scap squeeze with noodle 10 sec x 10    Shoulder Exercises: Pulleys   Flexion --  10 sec x 10    ABduction --  5 reps. 10 sec hold,  scaption   Other Pulley Exercises IR   to tolerance, 3 reps at 30 sec hold.   Shoulder Exercises: Therapy Ball   Other Therapy Ball Exercises ball on wall stabilization ~8 in ball at waist/chest level 1 min x 2   Other Therapy Ball Exercises small ball  ball btn hands for elbow flex/ext x 5 reps; repeated with small blue ball with horizontal abd/add, elbow flexion/ext, and supination/pronation with arms flexed ~80 deg   Modalities   Modalities Electrical Stimulation;Moist Heat                PT Education - 09/30/15 1016    Education provided Yes   Education Details HEP; discussion of slow stretch and avoiding compensation    Person(s) Educated Patient   Methods Explanation;Demonstration;Tactile cues;Verbal cues;Handout   Comprehension Verbalized understanding;Returned demonstration;Verbal cues required;Tactile cues required          PT Short Term Goals - 09/30/15 1019    PT SHORT TERM GOAL #1   Title Improve posture and alignment working to strengtheing posterior shoulder girdle musculature 10/02/14   Time 6   Period Weeks   Status On-going   PT SHORT TERM GOAL #2   Title Patient I in initial HEP 09/19/15   Time 4   Period Weeks   Status Achieved   PT SHORT TERM GOAL #3   Title Patient to incorporate shoulder exercise to pool exercise program 09/19/15   Time 4   Period Weeks   Status Achieved   PT SHORT TERM GOAL #4   Title Increase AROM Lt shoudler by 10-20 degrees throughout 10/03/15   Time 6   Status Achieved           PT Long Term Goals - 09/30/15 1020    PT LONG TERM GOAL #1   Title Increase AROM Lt shoulder to [facility] 11/14/15   Time 12   Period Weeks   Status On-going   PT LONG TERM GOAL #2   Title Functional strength allowing patient to reach up into low cabinet 11/14/15   Time 12   Period Weeks   Status Achieved   PT LONG TERM GOAL #3   Title Use Lt UE for crafts and light household chores 11/14/15   Time 12   Period Weeks   Status Achieved    PT LONG TERM GOAL #4   Title I in HEP for discharge including water exercise 11/14/15   Time 12   Period Weeks   Status On-going   PT LONG TERM GOAL #5   Title FOTO </= 36% limitation 11/14/15   Time 12   Period Weeks   Status On-going               Plan - 09/30/15 1017  Clinical Impression Statement Progressing well with functional activites with some continued intermittent pain. Working on [employer]. Gaining AROM/PROM and improving strength with exercises(not tested resistively). Sreeya continues to progress toward goals of therapy. She will benefit from continued treatment to accomplish stated goals of therapy if she wants to continue with treatment.    Pt will benefit from skilled therapeutic intervention in order to improve on the following deficits Postural dysfunction;Improper body mechanics;Decreased range of motion;Decreased mobility;Decreased strength;Pain;Increased fascial restricitons;Decreased endurance;Decreased activity tolerance   Rehab Potential Good   PT Frequency 2x / week   PT Duration 12 weeks   PT Treatment/Interventions Patient/family education;ADLs/Self Care Home Management;Therapeutic exercise;Therapeutic activities;Manual techniques;Neuromuscular re-education;Dry needling;Cryotherapy;Electrical Stimulation;Moist Heat;Ultrasound   PT Next Visit Plan progress with ROM and strengthening exercises per protocol Phase III (now 10 wks post op)   PT Home Exercise Plan postural correctin; HEP; progress with strengthening gradually as tolerated    Consulted and Agree with Plan of Care Patient        Problem List Patient Active Problem List   Diagnosis Date Noted  . Postoperative hypotension 07/12/2015  . Glenohumeral arthritis 07/10/2015  . Upper airway cough syndrome 07/03/2015  . Fever blister 11/06/2014  . Breath shortness 02/01/2014  . Chronic diastolic heart failure ([place]) 12/25/2013  . Cardiomyopathy ([place]) 12/21/2013  . Pulmonary fibrosis ([place]) 10/16/2013   . TR (tricuspid regurgitation) 10/16/2013  . Congestive heart failure with left ventricular diastolic dysfunction ([place]) 08/27/2013  . Secondary pulmonary hypertension ([place]) 08/27/2013  . CAFL (chronic airflow limitation) ([place]) 08/16/2013  . Acid reflux 08/16/2013  . MI (mitral incompetence) 06/30/2011  . History of recurrent UTI (urinary tract infection) 04/21/2010  . Atrial fibrillation ([place]) 05/13/2009  . OSTEOPOROSIS 05/13/2009  . HERPES ZOSTER 02/28/2009    Landynn Dupler Nilda Simmer PT, MPH  09/30/2015, 10:40 AM  [facility] [place], [place], [address] Phone: [phone]   Fax:  [phone]  Name: Matison Stoneback MRN: [id number] Date of Birth: [birth year]

## 2015-10-03 ENCOUNTER — Encounter: Payer: Medicare Other | Admitting: Physical Therapy

## 2015-10-03 ENCOUNTER — Ambulatory Visit (INDEPENDENT_AMBULATORY_CARE_PROVIDER_SITE_OTHER): Payer: Medicare Other | Admitting: Physical Therapy

## 2015-10-03 DIAGNOSIS — R29898 Other symptoms and signs involving the musculoskeletal system: Secondary | ICD-10-CM

## 2015-10-03 DIAGNOSIS — M25512 Pain in left shoulder: Secondary | ICD-10-CM

## 2015-10-03 DIAGNOSIS — M25612 Stiffness of left shoulder, not elsewhere classified: Secondary | ICD-10-CM

## 2015-10-03 DIAGNOSIS — Z7409 Other reduced mobility: Secondary | ICD-10-CM

## 2015-10-03 DIAGNOSIS — R6889 Other general symptoms and signs: Secondary | ICD-10-CM

## 2015-10-03 DIAGNOSIS — R531 Weakness: Secondary | ICD-10-CM

## 2015-10-03 NOTE — Therapy (Signed)
[place], [place], [address] Phone: [phone]   Fax:  [phone]  Physical Therapy Treatment  Patient Details  Name: Terri Small MRN: [id number] Date of Birth: June 19, 1931 Referring Provider: Dr. Tamera Punt  Encounter Date: 10/03/2015      PT End of Session - 10/03/15 1147    Visit Number 12   Number of Visits 24   Date for PT Re-Evaluation 11/14/15   PT Start Time 1107   PT Stop Time 1202   PT Time Calculation (min) 55 min      Past Medical History  Diagnosis Date  . Atrial fibrillation ([place])     s/p 2x ablation on chronic coumadin, previously on amiodarone but had pulmonary SE.  . Osteoporosis   . Herpes zoster   . Fracture, fibula 11/26/2013  . Fracture, sternum closed 11/26/13  . Fracture of lateral malleolus 11/17/2013  . Dysrhythmia   . CHF (congestive heart failure) ([place])   . Shortness of breath dyspnea   . GERD (gastroesophageal reflux disease)   . Diverticulitis   . Headache   . COPD (chronic obstructive pulmonary disease) [facility])     "pulmonologist told me last week my lungs are good" (07/10/2015)  . Arthritis     "everywhere"    Past Surgical History  Procedure Laterality Date  . Colonoscopy    . Total shoulder arthroplasty Left 07/10/2015  . Joint replacement    . Laparoscopic cholecystectomy  ~ 2009  . Vaginal hysterectomy    . Atrial fibrillation ablation  ~ 2002; ~ 2004  . Cardiac catheterization  ~ 2002; ~ 2004  . Total shoulder arthroplasty Left 07/10/2015    Procedure: LEFT TOTAL SHOULDER ARTHROPLASTY;  Surgeon: Tania Ade, MD;  Location: [place];  Service: Orthopedics;  Laterality: Left;  Left shoulder arthroplasty    There were no vitals filed for this visit.  Visit Diagnosis:  Stiffness of joint, shoulder region, left  Pain in shoulder region, left  Weakness of shoulder  Decreased strength, endurance, and mobility      Subjective Assessment - 10/03/15  1115    Subjective Pt reported she saw MD this morning; he is pleased with her progress.  No new changes with shoulder since last visit.    Patient Stated Goals full use of Lt arm - to be able to drive and reach into shelf   Currently in Pain? No/denies            [facility] PT Assessment - 10/03/15 0001    Assessment   Medical Diagnosis Lt shoulder arthroplasty   Referring Provider Dr. Tamera Punt   Onset Date/Surgical Date 07/10/15   Hand Dominance Right   Next MD Visit in 3 months         [facility] Adult PT Treatment/Exercise - 10/03/15 0001    Shoulder Exercises: Pulleys   Flexion --  10 sec x 10    ABduction --  10 reps. 10 sec hold, scaption   Other Pulley Exercises IR   to tolerance, 3 reps at 30 sec hold; with strap.   Shoulder Exercises: ROM/Strengthening   Other ROM/Strengthening Exercises wall ladder (flexion) up to #23 x 3 reps   Other ROM/Strengthening Exercises 1# wt lifted from waist height to eye height x 10 reps with controlled decent (LUE only);  Rings on hoop (12) Rt to/from Lt at lowest height (to build endurance) x 2 sets   Modalities   Modalities Electrical Stimulation;Moist Heat   Moist Heat  Therapy   Number Minutes Moist Heat 15 Minutes   Moist Heat Location Shoulder   Electrical Stimulation   Electrical Stimulation Location Lt shoulder   Electrical Stimulation Action IFC   Electrical Stimulation Parameters  to tolerance, 15 min    Electrical Stimulation Goals Pain   Manual Therapy   Manual Therapy Soft tissue mobilization;Joint mobilization;Myofascial release   Manual therapy comments pt supine   Joint Mobilization  Lt GH AP and inf mobs- Grade I and II.    Soft tissue mobilization to periscapular muscles    Myofascial Release to Lt pec, Lt subscap (then TPR to same area)   Passive ROM Lt shoulder ER @ 45 deg abd                  PT Short Term Goals - 09/30/15 1019    PT SHORT TERM GOAL #1   Title Improve posture and alignment working to  strengtheing posterior shoulder girdle musculature 10/02/14   Time 6   Period Weeks   Status On-going   PT SHORT TERM GOAL #2   Title Patient I in initial HEP 09/19/15   Time 4   Period Weeks   Status Achieved   PT SHORT TERM GOAL #3   Title Patient to incorporate shoulder exercise to pool exercise program 09/19/15   Time 4   Period Weeks   Status Achieved   PT SHORT TERM GOAL #4   Title Increase AROM Lt shoudler by 10-20 degrees throughout 10/03/15   Time 6   Status Achieved           PT Long Term Goals - 09/30/15 1020    PT LONG TERM GOAL #1   Title Increase AROM Lt shoulder to [facility] 11/14/15   Time 12   Period Weeks   Status On-going   PT LONG TERM GOAL #2   Title Functional strength allowing patient to reach up into low cabinet 11/14/15   Time 12   Period Weeks   Status Achieved   PT LONG TERM GOAL #3   Title Use Lt UE for crafts and light household chores 11/14/15   Time 12   Period Weeks   Status Achieved   PT LONG TERM GOAL #4   Title I in HEP for discharge including water exercise 11/14/15   Time 12   Period Weeks   Status On-going   PT LONG TERM GOAL #5   Title FOTO </= 36% limitation 11/14/15   Time 12   Period Weeks   Status On-going               Plan - 10/03/15 1209    Clinical Impression Statement Pt tolerated new exercises well, mild increase in pain and muscular fatigue.  Pt's Lt shoulder strength and AROM improving. Pt very point tender with manual therapy to Lt subscap and pec. Pain from therapy session reduced with use of heat/estim at end. Pt progressing towards established goals; will benefit from continued PT intervention to maximize functional mobility.    Pt will benefit from skilled therapeutic intervention in order to improve on the following deficits Postural dysfunction;Improper body mechanics;Decreased range of motion;Decreased mobility;Decreased strength;Pain;Increased fascial restricitons;Decreased endurance;Decreased activity  tolerance   Rehab Potential Good   PT Frequency 2x / week   PT Duration 12 weeks   PT Treatment/Interventions Patient/family education;ADLs/Self Care Home Management;Therapeutic exercise;Therapeutic activities;Manual techniques;Neuromuscular re-education;Dry needling;Cryotherapy;Electrical Stimulation;Moist Heat;Ultrasound   PT Next Visit Plan progress with ROM and strengthening exercises per protocol Phase  III    Consulted and Agree with Plan of Care Patient        Problem List Patient Active Problem List   Diagnosis Date Noted  . Postoperative hypotension 07/12/2015  . Glenohumeral arthritis 07/10/2015  . Upper airway cough syndrome 07/03/2015  . Fever blister 11/06/2014  . Breath shortness 02/01/2014  . Chronic diastolic heart failure ([place]) 12/25/2013  . Cardiomyopathy ([place]) 12/21/2013  . Pulmonary fibrosis ([place]) 10/16/2013  . TR (tricuspid regurgitation) 10/16/2013  . Congestive heart failure with left ventricular diastolic dysfunction ([place]) 08/27/2013  . Secondary pulmonary hypertension ([place]) 08/27/2013  . CAFL (chronic airflow limitation) ([place]) 08/16/2013  . Acid reflux 08/16/2013  . MI (mitral incompetence) 06/30/2011  . History of recurrent UTI (urinary tract infection) 04/21/2010  . Atrial fibrillation ([place]) 05/13/2009  . OSTEOPOROSIS 05/13/2009  . HERPES ZOSTER 02/28/2009   Kerin Perna, PTA 10/03/2015 1:02 PM  [place], [place], [address] Phone: [phone]   Fax:  [phone]  Name: Margarit Glade MRN: [id number] Date of Birth: 1931-09-06

## 2015-10-07 ENCOUNTER — Ambulatory Visit (INDEPENDENT_AMBULATORY_CARE_PROVIDER_SITE_OTHER): Payer: Medicare Other | Admitting: Physical Therapy

## 2015-10-07 DIAGNOSIS — Z7409 Other reduced mobility: Secondary | ICD-10-CM

## 2015-10-07 DIAGNOSIS — M25512 Pain in left shoulder: Secondary | ICD-10-CM | POA: Diagnosis not present

## 2015-10-07 DIAGNOSIS — M25612 Stiffness of left shoulder, not elsewhere classified: Secondary | ICD-10-CM

## 2015-10-07 DIAGNOSIS — R6889 Other general symptoms and signs: Secondary | ICD-10-CM

## 2015-10-07 DIAGNOSIS — R531 Weakness: Secondary | ICD-10-CM

## 2015-10-07 DIAGNOSIS — R29898 Other symptoms and signs involving the musculoskeletal system: Secondary | ICD-10-CM | POA: Diagnosis not present

## 2015-10-07 NOTE — Therapy (Signed)
[place], [place], [address] Phone: [phone]   Fax:  [phone]  Physical Therapy Treatment  Patient Details  Name: Terri Small MRN: [id number] Date of Birth: February 06, 1931 Referring Provider: Dr. Tamera Punt  Encounter Date: 10/07/2015      PT End of Session - 10/07/15 0854    Visit Number 13   Number of Visits 24   Date for PT Re-Evaluation 11/14/15   PT Start Time 0845   PT Stop Time 0945   PT Time Calculation (min) 60 min      Past Medical History  Diagnosis Date  . Atrial fibrillation ([place])     s/p 2x ablation on chronic coumadin, previously on amiodarone but had pulmonary SE.  . Osteoporosis   . Herpes zoster   . Fracture, fibula 11/26/2013  . Fracture, sternum closed 11/26/13  . Fracture of lateral malleolus 11/17/2013  . Dysrhythmia   . CHF (congestive heart failure) ([place])   . Shortness of breath dyspnea   . GERD (gastroesophageal reflux disease)   . Diverticulitis   . Headache   . COPD (chronic obstructive pulmonary disease) [facility])     "pulmonologist told me last week my lungs are good" (07/10/2015)  . Arthritis     "everywhere"    Past Surgical History  Procedure Laterality Date  . Colonoscopy    . Total shoulder arthroplasty Left 07/10/2015  . Joint replacement    . Laparoscopic cholecystectomy  ~ 2009  . Vaginal hysterectomy    . Atrial fibrillation ablation  ~ 2002; ~ 2004  . Cardiac catheterization  ~ 2002; ~ 2004  . Total shoulder arthroplasty Left 07/10/2015    Procedure: LEFT TOTAL SHOULDER ARTHROPLASTY;  Surgeon: Tania Ade, MD;  Location: [facility];  Service: Orthopedics;  Laterality: Left;  Left shoulder arthroplasty    There were no vitals filed for this visit.  Visit Diagnosis:  No diagnosis found.      Subjective Assessment - 10/07/15 0850    Subjective "I think I may have overdone it yesterday. Pain was up to 5/10".   Pt is on her 3rd antibiotic for UTI.  Otherwise no new changes with shoulder.    Currently in Pain? No/denies   Pain Location Shoulder            [facility] PT Assessment - 10/07/15 0001    Assessment   Medical Diagnosis Lt shoulder arthroplasty   Referring Provider Dr. Tamera Punt   Onset Date/Surgical Date 07/10/15   Hand Dominance Right   Next MD Visit in 3 months           [facility] Adult PT Treatment/Exercise - 10/07/15 0001    Shoulder Exercises: Standing   External Rotation Both;15 reps;Theraband   Theraband Level (Shoulder External Rotation) Level 1 (Yellow)   Shoulder Exercises: Pulleys   Flexion --  10 sec x 10    ABduction --  10 reps. 10 sec hold, scaption   Other Pulley Exercises IR   to tolerance, 3 reps at 30 sec hold; with strap.   Shoulder Exercises: ROM/Strengthening   UBE (Upper Arm Bike) L1: 1 min each direction.  Pt stopped due to feeling winded.    Wall Pushups --  12 reps elbows in, 5 rep elbows out   Other ROM/Strengthening Exercises wall ladder x 3 reps flexion (LUE) and 3 reps scaption    Other ROM/Strengthening Exercises  Rings on hoop (12) Rt to/from Lt at lowest height (to build endurance) 1  set   Modalities   Modalities Electrical Stimulation;Moist Heat   Moist Heat Therapy   Number Minutes Moist Heat 15 Minutes   Moist Heat Location Shoulder  Lt   Electrical Stimulation   Electrical Stimulation Location Lt shoulder   Electrical Stimulation Action IFC   Electrical Stimulation Parameters to tolerance    Electrical Stimulation Goals Pain   Manual Therapy   Manual Therapy Soft tissue mobilization;Myofascial release;Passive ROM   Manual therapy comments pt supine   Soft tissue mobilization to periscapular muscles    Myofascial Release to Lt pec, Lt subscap (then TPR to same area)   Passive ROM Lt shoulder ER @ 45 deg abd, Lt shoulder Flexion and scaption with scapula anchored.                   PT Short Term Goals - 09/30/15 1019    PT SHORT TERM GOAL #1   Title Improve  posture and alignment working to strengtheing posterior shoulder girdle musculature 10/02/14   Time 6   Period Weeks   Status On-going   PT SHORT TERM GOAL #2   Title Patient I in initial HEP 09/19/15   Time 4   Period Weeks   Status Achieved   PT SHORT TERM GOAL #3   Title Patient to incorporate shoulder exercise to pool exercise program 09/19/15   Time 4   Period Weeks   Status Achieved   PT SHORT TERM GOAL #4   Title Increase AROM Lt shoudler by 10-20 degrees throughout 10/03/15   Time 6   Status Achieved           PT Long Term Goals - 09/30/15 1020    PT LONG TERM GOAL #1   Title Increase AROM Lt shoulder to [facility] 11/14/15   Time 12   Period Weeks   Status On-going   PT LONG TERM GOAL #2   Title Functional strength allowing patient to reach up into low cabinet 11/14/15   Time 12   Period Weeks   Status Achieved   PT LONG TERM GOAL #3   Title Use Lt UE for crafts and light household chores 11/14/15   Time 12   Period Weeks   Status Achieved   PT LONG TERM GOAL #4   Title I in HEP for discharge including water exercise 11/14/15   Time 12   Period Weeks   Status On-going   PT LONG TERM GOAL #5   Title FOTO </= 36% limitation 11/14/15   Time 12   Period Weeks   Status On-going               Problem List Patient Active Problem List   Diagnosis Date Noted  . Postoperative hypotension 07/12/2015  . Glenohumeral arthritis 07/10/2015  . Upper airway cough syndrome 07/03/2015  . Fever blister 11/06/2014  . Breath shortness 02/01/2014  . Chronic diastolic heart failure ([place]) 12/25/2013  . Cardiomyopathy ([place]) 12/21/2013  . Pulmonary fibrosis ([place]) 10/16/2013  . TR (tricuspid regurgitation) 10/16/2013  . Congestive heart failure with left ventricular diastolic dysfunction ([place]) 08/27/2013  . Secondary pulmonary hypertension ([place]) 08/27/2013  . CAFL (chronic airflow limitation) ([place]) 08/16/2013  . Acid reflux 08/16/2013  . MI (mitral incompetence)  06/30/2011  . History of recurrent UTI (urinary tract infection) 04/21/2010  . Atrial fibrillation ([place]) 05/13/2009  . OSTEOPOROSIS 05/13/2009  . HERPES ZOSTER 02/28/2009   Kerin Perna, PTA 10/07/2015 1:01 PM  [facility] [address] [phone]  [place], [place], [address] Phone: [phone]   Fax:  [phone]  Name: Kiyra Faurot MRN: [id number] Date of Birth: July 24, 1931

## 2015-10-10 ENCOUNTER — Encounter: Payer: Self-pay | Admitting: Rehabilitative and Restorative Service Providers"

## 2015-10-10 ENCOUNTER — Ambulatory Visit (INDEPENDENT_AMBULATORY_CARE_PROVIDER_SITE_OTHER): Payer: Medicare Other | Admitting: Rehabilitative and Restorative Service Providers"

## 2015-10-10 DIAGNOSIS — R531 Weakness: Secondary | ICD-10-CM

## 2015-10-10 DIAGNOSIS — R29898 Other symptoms and signs involving the musculoskeletal system: Secondary | ICD-10-CM

## 2015-10-10 DIAGNOSIS — Z7409 Other reduced mobility: Secondary | ICD-10-CM

## 2015-10-10 DIAGNOSIS — M25512 Pain in left shoulder: Secondary | ICD-10-CM

## 2015-10-10 DIAGNOSIS — M25612 Stiffness of left shoulder, not elsewhere classified: Secondary | ICD-10-CM

## 2015-10-10 DIAGNOSIS — R6889 Other general symptoms and signs: Secondary | ICD-10-CM

## 2015-10-10 NOTE — Therapy (Signed)
[place], [place], [address] Phone: [phone]   Fax:  [phone]  Physical Therapy Treatment  Patient Details  Name: Terri Small MRN: [id number] Date of Birth: May 30, 1931 Referring Provider: Dr. Tamera Punt  Encounter Date: 10/10/2015      PT End of Session - 10/10/15 0840    Visit Number 14   Number of Visits 24   Date for PT Re-Evaluation 11/14/15   PT Start Time 0838   PT Stop Time 0929   PT Time Calculation (min) 51 min   Activity Tolerance Patient tolerated treatment well      Past Medical History  Diagnosis Date  . Atrial fibrillation ([place])     s/p 2x ablation on chronic coumadin, previously on amiodarone but had pulmonary SE.  . Osteoporosis   . Herpes zoster   . Fracture, fibula 11/26/2013  . Fracture, sternum closed 11/26/13  . Fracture of lateral malleolus 11/17/2013  . Dysrhythmia   . CHF (congestive heart failure) ([place])   . Shortness of breath dyspnea   . GERD (gastroesophageal reflux disease)   . Diverticulitis   . Headache   . COPD (chronic obstructive pulmonary disease) [facility]     "pulmonologist told me last week my lungs are good" (07/10/2015)  . Arthritis     "everywhere"    Past Surgical History  Procedure Laterality Date  . Colonoscopy    . Total shoulder arthroplasty Left 07/10/2015  . Joint replacement    . Laparoscopic cholecystectomy  ~ 2009  . Vaginal hysterectomy    . Atrial fibrillation ablation  ~ 2002; ~ 2004  . Cardiac catheterization  ~ 2002; ~ 2004  . Total shoulder arthroplasty Left 07/10/2015    Procedure: LEFT TOTAL SHOULDER ARTHROPLASTY;  Surgeon: Tania Ade, MD;  Location: [place];  Service: Orthopedics;  Laterality: Left;  Left shoulder arthroplasty    There were no vitals filed for this visit.  Visit Diagnosis:  Stiffness of joint, shoulder region, left  Pain in shoulder region, left  Weakness of shoulder  Decreased strength, endurance,  and mobility                       [facility] Adult PT Treatment/Exercise - 10/10/15 0001    Shoulder Exercises: Standing   External Rotation Both;15 reps;Theraband   Theraband Level (Shoulder External Rotation) Level 1 (Yellow)   Other Standing Exercises scap squeeze with noodle 10 sec x 10    Shoulder Exercises: Pulleys   Flexion --  10 sec x 10    ABduction --  10 reps. 10 sec hold, scaption   Other Pulley Exercises --   Shoulder Exercises: ROM/Strengthening   UBE (Upper Arm Bike) L1 x4 min 1/1 alt    Wall Pushups --  12 reps elbows in, 5 rep elbows out   Other ROM/Strengthening Exercises wall ladder x 3 reps flexion (LUE) and 3 reps scaption    Other ROM/Strengthening Exercises  Rings on hoop (12) Rt to/from Lt at lowest height (to build endurance) 1 set   Shoulder Exercises: Stretch   Other Shoulder Stretches hands clasp behind back pulling up into IR 20 sec x5   Shoulder Exercises: Body Blade   Flexion 15 seconds;4 reps   Flexion Limitations difficult with coordination - better using both hands on body blade together in front   Modalities   Modalities Electrical Stimulation;Moist Heat   Moist Heat Therapy   Number Minutes Moist Heat 15 Minutes  Moist Heat Location Shoulder  Lt   Electrical Stimulation   Electrical Stimulation Location Lt shoulder   Electrical Stimulation Action IFC   Electrical Stimulation Parameters to tolerance   Electrical Stimulation Goals Pain   Manual Therapy   Manual Therapy Soft tissue mobilization;Myofascial release;Passive ROM   Manual therapy comments pt supine   Joint Mobilization  Lt GH AP and inf mobs- Grade I and II.    Soft tissue mobilization to periscapular muscles    Myofascial Release to Lt pec, Lt subscap    Scapular Mobilization scap mobilizations    Passive ROM Lt shoulder ER @ 45 deg abd, Lt shoulder Flexion and scaption with scapula anchored.                   PT Short Term Goals - 09/30/15 1019     PT SHORT TERM GOAL #1   Title Improve posture and alignment working to strengtheing posterior shoulder girdle musculature 10/02/14   Time 6   Period Weeks   Status On-going   PT SHORT TERM GOAL #2   Title Patient I in initial HEP 09/19/15   Time 4   Period Weeks   Status Achieved   PT SHORT TERM GOAL #3   Title Patient to incorporate shoulder exercise to pool exercise program 09/19/15   Time 4   Period Weeks   Status Achieved   PT SHORT TERM GOAL #4   Title Increase AROM Lt shoudler by 10-20 degrees throughout 10/03/15   Time 6   Status Achieved           PT Long Term Goals - 09/30/15 1020    PT LONG TERM GOAL #1   Title Increase AROM Lt shoulder to [facility] 11/14/15   Time 12   Period Weeks   Status On-going   PT LONG TERM GOAL #2   Title Functional strength allowing patient to reach up into low cabinet 11/14/15   Time 12   Period Weeks   Status Achieved   PT LONG TERM GOAL #3   Title Use Lt UE for crafts and light household chores 11/14/15   Time 12   Period Weeks   Status Achieved   PT LONG TERM GOAL #4   Title I in HEP for discharge including water exercise 11/14/15   Time 12   Period Weeks   Status On-going   PT LONG TERM GOAL #5   Title FOTO </= 36% limitation 11/14/15   Time 12   Period Weeks   Status On-going               Plan - 10/10/15 0841    Clinical Impression Statement Doing OK - working on exercises at home and does "feel it" - some discomfort. She is using Lt UE for more functional activities. She can lift 1# items onto the lower shelf in kitchen.    Pt will benefit from skilled therapeutic intervention in order to improve on the following deficits Postural dysfunction;Improper body mechanics;Decreased range of motion;Decreased mobility;Decreased strength;Pain;Increased fascial restricitons;Decreased endurance;Decreased activity tolerance   Rehab Potential Good   PT Frequency 2x / week   PT Duration 12 weeks   PT Treatment/Interventions  Patient/family education;ADLs/Self Care Home Management;Therapeutic exercise;Therapeutic activities;Manual techniques;Neuromuscular re-education;Dry needling;Cryotherapy;Electrical Stimulation;Moist Heat;Ultrasound   PT Next Visit Plan progress with ROM and strengthening exercises per protocol Phase III    PT Home Exercise Plan postural correctin; HEP; progress with strengthening gradually as tolerated    Consulted and Agree with  Plan of Care Patient        Problem List Patient Active Problem List   Diagnosis Date Noted  . Postoperative hypotension 07/12/2015  . Glenohumeral arthritis 07/10/2015  . Upper airway cough syndrome 07/03/2015  . Fever blister 11/06/2014  . Breath shortness 02/01/2014  . Chronic diastolic heart failure ([place]) 12/25/2013  . Cardiomyopathy ([place]) 12/21/2013  . Pulmonary fibrosis ([place]) 10/16/2013  . TR (tricuspid regurgitation) 10/16/2013  . Congestive heart failure with left ventricular diastolic dysfunction ([place]) 08/27/2013  . Secondary pulmonary hypertension ([place]) 08/27/2013  . CAFL (chronic airflow limitation) ([place]) 08/16/2013  . Acid reflux 08/16/2013  . MI (mitral incompetence) 06/30/2011  . History of recurrent UTI (urinary tract infection) 04/21/2010  . Atrial fibrillation ([place]) 05/13/2009  . OSTEOPOROSIS 05/13/2009  . HERPES ZOSTER 02/28/2009    Kinley Dozier Nilda Simmer PT, MPH  10/10/2015, 9:24 AM  [facility] [place], [place], [address] Phone: [phone]   Fax:  [phone]  Name: Terri Small MRN: [id number] Date of Birth: 03/19/1931

## 2015-10-14 ENCOUNTER — Encounter: Payer: Self-pay | Admitting: Physical Therapy

## 2015-10-14 ENCOUNTER — Ambulatory Visit (INDEPENDENT_AMBULATORY_CARE_PROVIDER_SITE_OTHER): Payer: Medicare Other | Admitting: Physical Therapy

## 2015-10-14 DIAGNOSIS — R531 Weakness: Secondary | ICD-10-CM

## 2015-10-14 DIAGNOSIS — R29898 Other symptoms and signs involving the musculoskeletal system: Secondary | ICD-10-CM | POA: Diagnosis not present

## 2015-10-14 DIAGNOSIS — R6889 Other general symptoms and signs: Secondary | ICD-10-CM

## 2015-10-14 DIAGNOSIS — Z7409 Other reduced mobility: Secondary | ICD-10-CM

## 2015-10-14 DIAGNOSIS — M25612 Stiffness of left shoulder, not elsewhere classified: Secondary | ICD-10-CM

## 2015-10-14 DIAGNOSIS — M25512 Pain in left shoulder: Secondary | ICD-10-CM

## 2015-10-14 NOTE — Therapy (Signed)
[place], [place], [address] Phone: [phone]   Fax:  [phone]  Physical Therapy Treatment  Patient Details  Name: Terri Small MRN: [id number] Date of Birth: November 30, 1930 Referring Provider: Dr Tamera Punt  Encounter Date: 10/14/2015      PT End of Session - 10/14/15 0847    Visit Number 15   Number of Visits 24   Date for PT Re-Evaluation 11/14/15   PT Start Time 0846   PT Stop Time 0944   PT Time Calculation (min) 58 min   Activity Tolerance Patient tolerated treatment well      Past Medical History  Diagnosis Date  . Atrial fibrillation ([place])     s/p 2x ablation on chronic coumadin, previously on amiodarone but had pulmonary SE.  . Osteoporosis   . Herpes zoster   . Fracture, fibula 11/26/2013  . Fracture, sternum closed 11/26/13  . Fracture of lateral malleolus 11/17/2013  . Dysrhythmia   . CHF (congestive heart failure) ([place])   . Shortness of breath dyspnea   . GERD (gastroesophageal reflux disease)   . Diverticulitis   . Headache   . COPD (chronic obstructive pulmonary disease) [facility])     "pulmonologist told me last week my lungs are good" (07/10/2015)  . Arthritis     "everywhere"    Past Surgical History  Procedure Laterality Date  . Colonoscopy    . Total shoulder arthroplasty Left 07/10/2015  . Joint replacement    . Laparoscopic cholecystectomy  ~ 2009  . Vaginal hysterectomy    . Atrial fibrillation ablation  ~ 2002; ~ 2004  . Cardiac catheterization  ~ 2002; ~ 2004  . Total shoulder arthroplasty Left 07/10/2015    Procedure: LEFT TOTAL SHOULDER ARTHROPLASTY;  Surgeon: Tania Ade, MD;  Location: [facility];  Service: Orthopedics;  Laterality: Left;  Left shoulder arthroplasty    There were no vitals filed for this visit.  Visit Diagnosis:  Stiffness of joint, shoulder region, left  Pain in shoulder region, left  Weakness of shoulder  Decreased strength, endurance,  and mobility      Subjective Assessment - 10/14/15 0847    Subjective Pt reports she is feeling pretty good.    Currently in Pain? No/denies            [facility] PT Assessment - 10/14/15 0001    Assessment   Medical Diagnosis Lt shoulder arthroplasty   Referring Provider Dr Tamera Punt   Onset Date/Surgical Date 07/10/15   Hand Dominance Right   Next MD Visit in 3 months   AROM   Left Shoulder Flexion 132 Degrees   Left Shoulder ABduction 104 Degrees   Left Shoulder Internal Rotation 77 Degrees   Left Shoulder External Rotation 43 Degrees                     [facility] Adult PT Treatment/Exercise - 10/14/15 0001    Shoulder Exercises: Supine   Horizontal ABduction Strengthening;Both;Theraband;20 reps   Theraband Level (Shoulder Horizontal ABduction) Level 1 (Yellow)   External Rotation Strengthening;Both;20 reps;Theraband   Theraband Level (Shoulder External Rotation) Level 1 (Yellow)   Flexion Strengthening;Both;10 reps  overhead pull with yellow band   Shoulder Flexion Weight (lbs) 10 reps 1# to 90 degrees.    Other Supine Exercises SASH with yellow band in avail ROM x 10, 10x10sec thoracic lifts    Other Supine Exercises holding 1#, CW/CCW circles at 90 degrees flexion   Shoulder Exercises: ROM/Strengthening  UBE (Upper Arm Bike) L2x3' alt   Modalities   Modalities Electrical Stimulation;Moist Heat   Moist Heat Therapy   Number Minutes Moist Heat 15 Minutes   Moist Heat Location Shoulder   Electrical Stimulation   Electrical Stimulation Location Lt shoulder   Electrical Stimulation Action IFC   Electrical Stimulation Parameters to tolerance   Electrical Stimulation Goals Pain   Manual Therapy   Manual Therapy Joint mobilization;Soft tissue mobilization;Passive ROM   Joint Mobilization  Lt GH AP and inf mobs- Grade I and II.    Myofascial Release to Lt pec, Lt subscap    Passive ROM Lt shoulder ER @ 45 deg abd, Lt shoulder Flexion and scaption with scapula  anchored.                   PT Short Term Goals - 09/30/15 1019    PT SHORT TERM GOAL #1   Title Improve posture and alignment working to strengtheing posterior shoulder girdle musculature 10/02/14   Time 6   Period Weeks   Status On-going   PT SHORT TERM GOAL #2   Title Patient I in initial HEP 09/19/15   Time 4   Period Weeks   Status Achieved   PT SHORT TERM GOAL #3   Title Patient to incorporate shoulder exercise to pool exercise program 09/19/15   Time 4   Period Weeks   Status Achieved   PT SHORT TERM GOAL #4   Title Increase AROM Lt shoudler by 10-20 degrees throughout 10/03/15   Time 6   Status Achieved           PT Long Term Goals - 10/14/15 0859    PT LONG TERM GOAL #1   Title Increase AROM Lt shoulder to [facility] 11/14/15   Status On-going   PT LONG TERM GOAL #2   Title Functional strength allowing patient to reach up into low cabinet 11/14/15   Status Achieved   PT LONG TERM GOAL #3   Title Use Lt UE for crafts and light household chores 11/14/15   Status Achieved   PT LONG TERM GOAL #4   Title I in HEP for discharge including water exercise 11/14/15   Status On-going   PT LONG TERM GOAL #5   Title FOTO </= 36% limitation 11/14/15   Status On-going               Plan - 10/14/15 0930    Clinical Impression Statement Pt  continues with steady improvement. ROM in Lt shoulder increasing and able to tolerate more exercise.  She does become SOB and require rest breaks.  Tightness in teres minor and pecs liimit some ROM   Pt will benefit from skilled therapeutic intervention in order to improve on the following deficits Postural dysfunction;Improper body mechanics;Decreased range of motion;Decreased mobility;Decreased strength;Pain;Increased fascial restricitons;Decreased endurance;Decreased activity tolerance   Rehab Potential Good   PT Frequency 2x / week   PT Duration 12 weeks   PT Treatment/Interventions Patient/family education;ADLs/Self Care  Home Management;Therapeutic exercise;Therapeutic activities;Manual techniques;Neuromuscular re-education;Dry needling;Cryotherapy;Electrical Stimulation;Moist Heat;Ultrasound   PT Next Visit Plan progress with ROM and strengthening exercises per protocol Phase III    Consulted and Agree with Plan of Care Patient        Problem List Patient Active Problem List   Diagnosis Date Noted  . Postoperative hypotension 07/12/2015  . Glenohumeral arthritis 07/10/2015  . Upper airway cough syndrome 07/03/2015  . Fever blister 11/06/2014  . Breath shortness 02/01/2014  .  Chronic diastolic heart failure ([place]) 12/25/2013  . Cardiomyopathy ([place]) 12/21/2013  . Pulmonary fibrosis ([place]) 10/16/2013  . TR (tricuspid regurgitation) 10/16/2013  . Congestive heart failure with left ventricular diastolic dysfunction ([place]) 08/27/2013  . Secondary pulmonary hypertension ([place]) 08/27/2013  . CAFL (chronic airflow limitation) ([place]) 08/16/2013  . Acid reflux 08/16/2013  . MI (mitral incompetence) 06/30/2011  . History of recurrent UTI (urinary tract infection) 04/21/2010  . Atrial fibrillation ([place]) 05/13/2009  . OSTEOPOROSIS 05/13/2009  . HERPES ZOSTER 02/28/2009    Jeral Pinch PT 10/14/2015, 9:32 AM  [facility] [place], [place], [address] Phone: [phone]   Fax:  [phone]  Name: Terri Small MRN: [id number] Date of Birth: 03/23/1931

## 2015-10-17 ENCOUNTER — Ambulatory Visit (INDEPENDENT_AMBULATORY_CARE_PROVIDER_SITE_OTHER): Payer: Medicare Other | Admitting: Physical Therapy

## 2015-10-17 DIAGNOSIS — M25612 Stiffness of left shoulder, not elsewhere classified: Secondary | ICD-10-CM | POA: Diagnosis present

## 2015-10-17 DIAGNOSIS — R531 Weakness: Secondary | ICD-10-CM

## 2015-10-17 DIAGNOSIS — R29898 Other symptoms and signs involving the musculoskeletal system: Secondary | ICD-10-CM | POA: Diagnosis not present

## 2015-10-17 DIAGNOSIS — M25512 Pain in left shoulder: Secondary | ICD-10-CM | POA: Diagnosis not present

## 2015-10-17 DIAGNOSIS — Z7409 Other reduced mobility: Secondary | ICD-10-CM | POA: Diagnosis not present

## 2015-10-17 DIAGNOSIS — R6889 Other general symptoms and signs: Secondary | ICD-10-CM

## 2015-10-17 NOTE — Therapy (Signed)
[place], [place], [address] Phone: [phone]   Fax:  [phone]  Physical Therapy Treatment  Patient Details  Name: Terri Small MRN: [id number] Date of Birth: 1930-11-04 Referring Provider: Dr Tamera Punt  Encounter Date: 10/17/2015      PT End of Session - 10/17/15 1145    Visit Number 16   Number of Visits 24   Date for PT Re-Evaluation 11/14/15   PT Start Time [id number]   PT Stop Time 1245   PT Time Calculation (min) 61 min      Past Medical History  Diagnosis Date  . Atrial fibrillation ([place])     s/p 2x ablation on chronic coumadin, previously on amiodarone but had pulmonary SE.  . Osteoporosis   . Herpes zoster   . Fracture, fibula 11/26/2013  . Fracture, sternum closed 11/26/13  . Fracture of lateral malleolus 11/17/2013  . Dysrhythmia   . CHF (congestive heart failure) ([place])   . Shortness of breath dyspnea   . GERD (gastroesophageal reflux disease)   . Diverticulitis   . Headache   . COPD (chronic obstructive pulmonary disease) [facility])     "pulmonologist told me last week my lungs are good" (07/10/2015)  . Arthritis     "everywhere"    Past Surgical History  Procedure Laterality Date  . Colonoscopy    . Total shoulder arthroplasty Left 07/10/2015  . Joint replacement    . Laparoscopic cholecystectomy  ~ 2009  . Vaginal hysterectomy    . Atrial fibrillation ablation  ~ 2002; ~ 2004  . Cardiac catheterization  ~ 2002; ~ 2004  . Total shoulder arthroplasty Left 07/10/2015    Procedure: LEFT TOTAL SHOULDER ARTHROPLASTY;  Surgeon: Tania Ade, MD;  Location: [place];  Service: Orthopedics;  Laterality: Left;  Left shoulder arthroplasty    There were no vitals filed for this visit.  Visit Diagnosis:  Stiffness of joint, shoulder region, left  Pain in shoulder region, left  Weakness of shoulder  Decreased strength, endurance, and mobility      Subjective Assessment - 10/17/15  1146    Subjective drove for the first time today. feeling good about it!    Currently in Pain? No/denies   Multiple Pain Sites No                         [facility] Adult PT Treatment/Exercise - 10/17/15 1156    Shoulder Exercises: Supine   Other Supine Exercises holding 1#, CW/CCW circles at 90 degrees flexion   Shoulder Exercises: Standing   Theraband Level (Shoulder External Rotation) Level 1 (Yellow)                  PT Short Term Goals - 09/30/15 1019    PT SHORT TERM GOAL #1   Title Improve posture and alignment working to strengtheing posterior shoulder girdle musculature 10/02/14   Time 6   Period Weeks   Status On-going   PT SHORT TERM GOAL #2   Title Patient I in initial HEP 09/19/15   Time 4   Period Weeks   Status Achieved   PT SHORT TERM GOAL #3   Title Patient to incorporate shoulder exercise to pool exercise program 09/19/15   Time 4   Period Weeks   Status Achieved   PT SHORT TERM GOAL #4   Title Increase AROM Lt shoudler by 10-20 degrees throughout 10/03/15   Time 6   Status  Achieved           PT Long Term Goals - 10/14/15 0859    PT LONG TERM GOAL #1   Title Increase AROM Lt shoulder to [facility] 11/14/15   Status On-going   PT LONG TERM GOAL #2   Title Functional strength allowing patient to reach up into low cabinet 11/14/15   Status Achieved   PT LONG TERM GOAL #3   Title Use Lt UE for crafts and light household chores 11/14/15   Status Achieved   PT LONG TERM GOAL #4   Title I in HEP for discharge including water exercise 11/14/15   Status On-going   PT LONG TERM GOAL #5   Title FOTO </= 36% limitation 11/14/15   Status On-going               Plan - 10/17/15 1218    Clinical Impression Statement Pt did well in PT session today welll motivated.  She is compliant with HEP. She is doing well with progression in ROM.    PT Next Visit Plan PROM with ROM and Strengthening.    Consulted and Agree with Plan of Care Patient         Problem List Patient Active Problem List   Diagnosis Date Noted  . Postoperative hypotension 07/12/2015  . Glenohumeral arthritis 07/10/2015  . Upper airway cough syndrome 07/03/2015  . Fever blister 11/06/2014  . Breath shortness 02/01/2014  . Chronic diastolic heart failure ([place]) 12/25/2013  . Cardiomyopathy ([place]) 12/21/2013  . Pulmonary fibrosis ([place] 10/16/2013  . TR (tricuspid regurgitation) 10/16/2013  . Congestive heart failure with left ventricular diastolic dysfunction ([employer]) 08/27/2013  . Secondary pulmonary hypertension ([place]) 08/27/2013  . CAFL (chronic airflow limitation) ([place]) 08/16/2013  . Acid reflux 08/16/2013  . MI (mitral incompetence) 06/30/2011  . History of recurrent UTI (urinary tract infection) 04/21/2010  . Atrial fibrillation ([place]) 05/13/2009  . OSTEOPOROSIS 05/13/2009  . HERPES ZOSTER 02/28/2009     Natividad Brood, PTA  10/17/2015, 12:32 PM  [facility] [place], [place], [address] Phone: [phone]   Fax:  [phone]  Name: Terri Small MRN: [id number] Date of Birth: 02-22-1931

## 2015-10-21 ENCOUNTER — Ambulatory Visit (INDEPENDENT_AMBULATORY_CARE_PROVIDER_SITE_OTHER): Payer: Medicare Other | Admitting: Physical Therapy

## 2015-10-21 DIAGNOSIS — Z7409 Other reduced mobility: Secondary | ICD-10-CM

## 2015-10-21 DIAGNOSIS — R6889 Other general symptoms and signs: Secondary | ICD-10-CM

## 2015-10-21 DIAGNOSIS — R29898 Other symptoms and signs involving the musculoskeletal system: Secondary | ICD-10-CM

## 2015-10-21 DIAGNOSIS — R531 Weakness: Secondary | ICD-10-CM

## 2015-10-21 DIAGNOSIS — M25612 Stiffness of left shoulder, not elsewhere classified: Secondary | ICD-10-CM | POA: Diagnosis present

## 2015-10-21 DIAGNOSIS — M25512 Pain in left shoulder: Secondary | ICD-10-CM
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
## 2015-10-24 ENCOUNTER — Ambulatory Visit (INDEPENDENT_AMBULATORY_CARE_PROVIDER_SITE_OTHER): Payer: Medicare Other | Admitting: Physical Therapy

## 2015-10-24 DIAGNOSIS — R6889 Other general symptoms and signs: Secondary | ICD-10-CM

## 2015-10-24 DIAGNOSIS — M25512 Pain in left shoulder: Secondary | ICD-10-CM | POA: Diagnosis not present

## 2015-10-24 DIAGNOSIS — R29898 Other symptoms and signs involving the musculoskeletal system: Secondary | ICD-10-CM

## 2015-10-24 DIAGNOSIS — M25612 Stiffness of left shoulder, not elsewhere classified: Secondary | ICD-10-CM | POA: Diagnosis present

## 2015-10-24 DIAGNOSIS — Z7409 Other reduced mobility: Secondary | ICD-10-CM | POA: Diagnosis not present

## 2015-10-24 DIAGNOSIS — R531 Weakness: Secondary | ICD-10-CM

## 2015-10-24 NOTE — Therapy (Signed)
[place], [place], [address] Phone: [phone]   Fax:  [phone]  Physical Therapy Treatment  Patient Details  Name: Terri Small MRN: [id number] Date of Birth: [birth year] Referring Provider: Dr. Tamera Punt   Encounter Date: 10/24/2015      PT End of Session - 10/24/15 0850    Visit Number 18   Number of Visits 24   Date for PT Re-Evaluation 11/14/15   PT Start Time [id number]   PT Stop Time 0941   PT Time Calculation (min) 54 min   Activity Tolerance Patient tolerated treatment well      Past Medical History  Diagnosis Date  . Atrial fibrillation ([place])     s/p 2x ablation on chronic coumadin, previously on amiodarone but had pulmonary SE.  . Osteoporosis   . Herpes zoster   . Fracture, fibula 11/26/2013  . Fracture, sternum closed 11/26/13  . Fracture of lateral malleolus 11/17/2013  . Dysrhythmia   . CHF (congestive heart failure) ([place])   . Shortness of breath dyspnea   . GERD (gastroesophageal reflux disease)   . Diverticulitis   . Headache   . COPD (chronic obstructive pulmonary disease) [facility])     "pulmonologist told me last week my lungs are good" (07/10/2015)  . Arthritis     "everywhere"    Past Surgical History  Procedure Laterality Date  . Colonoscopy    . Total shoulder arthroplasty Left 07/10/2015  . Joint replacement    . Laparoscopic cholecystectomy  ~ 2009  . Vaginal hysterectomy    . Atrial fibrillation ablation  ~ 2002; ~ 2004  . Cardiac catheterization  ~ 2002; ~ 2004  . Total shoulder arthroplasty Left 07/10/2015    Procedure: LEFT TOTAL SHOULDER ARTHROPLASTY;  Surgeon: Tania Ade, MD;  Location: [place];  Service: Orthopedics;  Laterality: Left;  Left shoulder arthroplasty    There were no vitals filed for this visit.  Visit Diagnosis:  Stiffness of joint, shoulder region, left  Pain in shoulder region, left  Weakness of shoulder  Decreased strength, endurance,  and mobility      Subjective Assessment - 10/24/15 0848    Subjective Pt reports no new changes.  "I don't baby my arm anymore"    Currently in Pain? No/denies            [facility] PT Assessment - 10/24/15 0001    Assessment   Medical Diagnosis Lt shoulder arthroplasty   Referring Provider Dr. Tamera Punt    Onset Date/Surgical Date 07/10/15   Hand Dominance Right   Next MD Visit March 2017   AROM   Left Shoulder Flexion 124 Degrees  standing, 132 deg supine   Left Shoulder ABduction 105 Degrees  standing   Left Shoulder Internal Rotation --  to T12 behind back   Left Shoulder External Rotation 43 Degrees   PROM   Left Shoulder External Rotation 50 Degrees          [facility] Adult PT Treatment/Exercise - 10/24/15 0001    Shoulder Exercises: Supine   Horizontal ABduction Strengthening;Both;15 reps;Theraband   Theraband Level (Shoulder Horizontal ABduction) Level 2 (Red)   Other Supine Exercises Sash/diagonals with red band x 10 reps each arm x 2 sets   Shoulder Exercises: Seated   External Rotation Both;Theraband;20 reps   Theraband Level (Shoulder External Rotation) Level 1 (Yellow)   Other Seated Exercises Seated bicep curls with 3# bilat x 10 reps. (challenging)   Shoulder Exercises: Standing  Other Standing Exercises Lifting empty 1/2 pink jar at above head  shelf x 2 reps; 2# lift to chin level height x 8 reps, 5# lift to same shelf with assist from RUE x 4 reps.    Other Standing Exercises Wall ladder to #24 flexion LUE; repeated in Lt scaption up to #23. - 5 reps    Shoulder Exercises: ROM/Strengthening   UBE (Upper Arm Bike) L2x4' alt   Moist Heat Therapy   Number Minutes Moist Heat 15 Minutes   Moist Heat Location Shoulder  Lt   Electrical Stimulation   Electrical Stimulation Location --  pt declined.    Manual Therapy   Manual therapy comments pt supine   Soft tissue mobilization Contract/Relax ER/IR, TPR to Lt subscap, infraspinatus, teres major and  periscapular muscles to Lt UE.                   PT Short Term Goals - 09/30/15 1019    PT SHORT TERM GOAL #1   Title Improve posture and alignment working to strengtheing posterior shoulder girdle musculature 10/02/14   Time 6   Period Weeks   Status On-going   PT SHORT TERM GOAL #2   Title Patient I in initial HEP 09/19/15   Time 4   Period Weeks   Status Achieved   PT SHORT TERM GOAL #3   Title Patient to incorporate shoulder exercise to pool exercise program 09/19/15   Time 4   Period Weeks   Status Achieved   PT SHORT TERM GOAL #4   Title Increase AROM Lt shoudler by 10-20 degrees throughout 10/03/15   Time 6   Status Achieved           PT Long Term Goals - 10/24/15 0919    PT LONG TERM GOAL #1   Title Increase AROM Lt shoulder to [facility] 11/14/15   Time 12   Period Weeks   Status On-going   PT LONG TERM GOAL #2   Title Functional strength allowing patient to reach up into low cabinet 11/14/15   Time 12   Period Weeks   Status Achieved   PT LONG TERM GOAL #3   Title Use Lt UE for crafts and light household chores 11/14/15   Time 12   Period Weeks   Status Achieved   PT LONG TERM GOAL #4   Title I in HEP for discharge including water exercise 11/14/15   Time 12   Period Weeks   Status On-going   PT LONG TERM GOAL #5   Title FOTO </= 36% limitation 11/14/15   Time 12   Period Weeks   Status On-going               Plan - 10/24/15 0931    Clinical Impression Statement Pt tolerated increased resistance with functional task exercise of weight on shelf, with reduced difficulty. Pt point tender with manual therapy to Lt subscap/ infraspinatus reduced with TPR and MHP at and of session. Progressing towards remaining goals.    Pt will benefit from skilled therapeutic intervention in order to improve on the following deficits Postural dysfunction;Improper body mechanics;Decreased range of motion;Decreased mobility;Decreased strength;Pain;Increased  fascial restricitons;Decreased endurance;Decreased activity tolerance   Rehab Potential Good   PT Frequency 2x / week   PT Duration 12 weeks   PT Treatment/Interventions Patient/family education;ADLs/Self Care Home Management;Therapeutic exercise;Therapeutic activities;Manual techniques;Neuromuscular re-education;Dry needling;Cryotherapy;Electrical Stimulation;Moist Heat;Ultrasound   PT Next Visit Plan Continue PROM with ROM and Strengthening to LUE.  Consulted and Agree with Plan of Care Patient        Problem List Patient Active Problem List   Diagnosis Date Noted  . Postoperative hypotension 07/12/2015  . Glenohumeral arthritis 07/10/2015  . Upper airway cough syndrome 07/03/2015  . Fever blister 11/06/2014  . Breath shortness 02/01/2014  . Chronic diastolic heart failure ([place]) 12/25/2013  . Cardiomyopathy ([place]) 12/21/2013  . Pulmonary fibrosis ([place]) 10/16/2013  . TR (tricuspid regurgitation) 10/16/2013  . Congestive heart failure with left ventricular diastolic dysfunction ([place]) 08/27/2013  . Secondary pulmonary hypertension ([place]) 08/27/2013  . CAFL (chronic airflow limitation) ([place]) 08/16/2013  . Acid reflux 08/16/2013  . MI (mitral incompetence) 06/30/2011  . History of recurrent UTI (urinary tract infection) 04/21/2010  . Atrial fibrillation ([place]) 05/13/2009  . OSTEOPOROSIS 05/13/2009  . HERPES ZOSTER 02/28/2009    Kerin Perna, PTA 10/24/2015 9:38 AM  [facility] [place], [place], [address] Phone: [phone]   Fax:  [phone]  Name: Terri Small MRN: [id number] Date of Birth: 10-01-1931

## 2015-10-28 ENCOUNTER — Encounter: Payer: Medicare Other | Admitting: Physical Therapy

## 2015-10-28 NOTE — Therapy (Signed)
[place], [place], [address] Phone: [phone]   Fax:  [phone]  Physical Therapy Treatment  Patient Details  Name: Terri Small MRN: [id number] Date of Birth: 01/08/1931 Referring Provider: Dr. Tamera Punt   Encounter Date: 10/17/2015    Past Medical History  Diagnosis Date  . Atrial fibrillation ([place])     s/p 2x ablation on chronic coumadin, previously on amiodarone but had pulmonary SE.  . Osteoporosis   . Herpes zoster   . Fracture, fibula 11/26/2013  . Fracture, sternum closed 11/26/13  . Fracture of lateral malleolus 11/17/2013  . Dysrhythmia   . CHF (congestive heart failure) ([place])   . Shortness of breath dyspnea   . GERD (gastroesophageal reflux disease)   . Diverticulitis   . Headache   . COPD (chronic obstructive pulmonary disease) [facility])     "pulmonologist told me last week my lungs are good" (07/10/2015)  . Arthritis     "everywhere"    Past Surgical History  Procedure Laterality Date  . Colonoscopy    . Total shoulder arthroplasty Left 07/10/2015  . Joint replacement    . Laparoscopic cholecystectomy  ~ 2009  . Vaginal hysterectomy    . Atrial fibrillation ablation  ~ 2002; ~ 2004  . Cardiac catheterization  ~ 2002; ~ 2004  . Total shoulder arthroplasty Left 07/10/2015    Procedure: LEFT TOTAL SHOULDER ARTHROPLASTY;  Surgeon: Tania Ade, MD;  Location: [place];  Service: Orthopedics;  Laterality: Left;  Left shoulder arthroplasty    There were no vitals filed for this visit.  Visit Diagnosis:  Stiffness of joint, shoulder region, left  Pain in shoulder region, left  Weakness of shoulder  Decreased strength, endurance, and mobility                                 PT Short Term Goals - 09/30/15 1019    PT SHORT TERM GOAL #1   Title Improve posture and alignment working to strengtheing posterior shoulder girdle musculature 10/02/14   Time 6    Period Weeks   Status On-going   PT SHORT TERM GOAL #2   Title Patient I in initial HEP 09/19/15   Time 4   Period Weeks   Status Achieved   PT SHORT TERM GOAL #3   Title Patient to incorporate shoulder exercise to pool exercise program 09/19/15   Time 4   Period Weeks   Status Achieved   PT SHORT TERM GOAL #4   Title Increase AROM Lt shoudler by 10-20 degrees throughout 10/03/15   Time 6   Status Achieved           PT Long Term Goals - 10/24/15 0919    PT LONG TERM GOAL #1   Title Increase AROM Lt shoulder to [facility] 11/14/15   Time 12   Period Weeks   Status On-going   PT LONG TERM GOAL #2   Title Functional strength allowing patient to reach up into low cabinet 11/14/15   Time 12   Period Weeks   Status Achieved   PT LONG TERM GOAL #3   Title Use Lt UE for crafts and light household chores 11/14/15   Time 12   Period Weeks   Status Achieved   PT LONG TERM GOAL #4   Title I in HEP for discharge including water exercise 11/14/15   Time 12   Period  Weeks   Status On-going   PT LONG TERM GOAL #5   Title FOTO </= 36% limitation 11/14/15   Time 12   Period Weeks   Status On-going               Problem List Patient Active Problem List   Diagnosis Date Noted  . Postoperative hypotension 07/12/2015  . Glenohumeral arthritis 07/10/2015  . Upper airway cough syndrome 07/03/2015  . Fever blister 11/06/2014  . Breath shortness 02/01/2014  . Chronic diastolic heart failure ([place]) 12/25/2013  . Cardiomyopathy ([place]) 12/21/2013  . Pulmonary fibrosis ([place]) 10/16/2013  . TR (tricuspid regurgitation) 10/16/2013  . Congestive heart failure with left ventricular diastolic dysfunction ([place]) 08/27/2013  . Secondary pulmonary hypertension ([place]) 08/27/2013  . CAFL (chronic airflow limitation) ([place]) 08/16/2013  . Acid reflux 08/16/2013  . MI (mitral incompetence) 06/30/2011  . History of recurrent UTI (urinary tract infection) 04/21/2010  . Atrial fibrillation ([place])  05/13/2009  . OSTEOPOROSIS 05/13/2009  . HERPES ZOSTER 02/28/2009  [facility] Joint Mobs with Contract/Relax for all motions with distraction between each set. MHP/IFC for pain  Natividad Brood 10/28/2015, 9:17 AM  [facility] [place], [place], [address] Phone: [phone]   Fax:  [phone]  Name: Stela Dunsford MRN: [id number] Date of Birth: [birth year]

## 2015-10-31 ENCOUNTER — Ambulatory Visit (INDEPENDENT_AMBULATORY_CARE_PROVIDER_SITE_OTHER): Payer: Medicare Other | Admitting: Physical Therapy

## 2015-10-31 DIAGNOSIS — M25612 Stiffness of left shoulder, not elsewhere classified: Secondary | ICD-10-CM | POA: Diagnosis present

## 2015-10-31 DIAGNOSIS — R29898 Other symptoms and signs involving the musculoskeletal system: Secondary | ICD-10-CM

## 2015-10-31 DIAGNOSIS — Z7409 Other reduced mobility: Secondary | ICD-10-CM | POA: Diagnosis not present

## 2015-10-31 DIAGNOSIS — M25512 Pain in left shoulder: Secondary | ICD-10-CM

## 2015-10-31 DIAGNOSIS — R531 Weakness: Secondary | ICD-10-CM

## 2015-10-31 DIAGNOSIS — R6889 Other general symptoms and signs: Secondary | ICD-10-CM

## 2015-10-31 NOTE — Patient Instructions (Signed)
Laying down:  Diagonals x 10 reps x 2 sets with red band External rotation (elbows in, fists go out) red or yellow band x 10 x 2 sets Horizontal abduction (arms straight and out to side) with green band x 10   Standing:  Rowing (elbows bent, pull back) with green band x 10 reps Extension (elbows straight, pull back) with green band x 10 reps  (disregard resisted Internal rotation -pulling band to belly button)   Continue with pulleys and stretches.    [facility] at [facility] [place], [place] [address]  [phone] (office) [phone] (fax)

## 2015-10-31 NOTE — Therapy (Signed)
[place], [place], [address] Phone: [phone]   Fax:  [phone]  Physical Therapy Treatment  Patient Details  Name: Terri Small MRN: [id number] Date of Birth: 10/03/1931 Referring Provider: Dr. Tamera Punt  Encounter Date: 10/31/2015      PT End of Session - 10/31/15 0850    Visit Number 19   Number of Visits 24   Date for PT Re-Evaluation 11/14/15   PT Start Time 4627   PT Stop Time 0942   PT Time Calculation (min) 55 min   Activity Tolerance Patient tolerated treatment well;No increased pain      Past Medical History  Diagnosis Date  . Atrial fibrillation ([place])     s/p 2x ablation on chronic coumadin, previously on amiodarone but had pulmonary SE.  . Osteoporosis   . Herpes zoster   . Fracture, fibula 11/26/2013  . Fracture, sternum closed 11/26/13  . Fracture of lateral malleolus 11/17/2013  . Dysrhythmia   . CHF (congestive heart failure) ([place])   . Shortness of breath dyspnea   . GERD (gastroesophageal reflux disease)   . Diverticulitis   . Headache   . COPD (chronic obstructive pulmonary disease) [facility])     "pulmonologist told me last week my lungs are good" (07/10/2015)  . Arthritis     "everywhere"    Past Surgical History  Procedure Laterality Date  . Colonoscopy    . Total shoulder arthroplasty Left 07/10/2015  . Joint replacement    . Laparoscopic cholecystectomy  ~ 2009  . Vaginal hysterectomy    . Atrial fibrillation ablation  ~ 2002; ~ 2004  . Cardiac catheterization  ~ 2002; ~ 2004  . Total shoulder arthroplasty Left 07/10/2015    Procedure: LEFT TOTAL SHOULDER ARTHROPLASTY;  Surgeon: Tania Ade, MD;  Location: [facility];  Service: Orthopedics;  Laterality: Left;  Left shoulder arthroplasty    There were no vitals filed for this visit.  Visit Diagnosis:  Stiffness of joint, shoulder region, left  Pain in shoulder region, left  Weakness of shoulder  Decreased  strength, endurance, and mobility      Subjective Assessment - 10/31/15 0850    Subjective Pt reports she has noticed some gradual improvements in strength and motion.  She uses pulley every day and notices she is stiff initially, but it loosens up.     Currently in Pain? No/denies            [facility] PT Assessment - 10/31/15 0001    Assessment   Medical Diagnosis Lt shoulder arthroplasty   Referring Provider Dr. Tamera Punt   Onset Date/Surgical Date 07/10/15   Hand Dominance Right   Next MD Visit March 2017   Observation/Other Assessments   Focus on Therapeutic Outcomes (FOTO)  37% limited    AROM   Left Shoulder Flexion 126 Degrees  standing, 137 deg supine   Left Shoulder ABduction 108 Degrees  standing   Left Shoulder Internal Rotation --  to T12 behind back   Left Shoulder External Rotation 53 Degrees  60 deg abd supine                     [facility] Adult PT Treatment/Exercise - 10/31/15 0001    Shoulder Exercises: Supine   Horizontal ABduction Strengthening;Both;10 reps   Theraband Level (Shoulder Horizontal ABduction) Level 3 (Green)   External Rotation Both;10 reps;Theraband   Theraband Level (Shoulder External Rotation) Level 2 (Red)   Other Supine Exercises Sash/diagonals  with red band x 10 reps each arm  green too challenging   Shoulder Exercises: Standing   Horizontal ABduction AROM;Both;5 reps   Internal Rotation Strengthening;Left;5 reps   Theraband Level (Shoulder Internal Rotation) Level 3 (Green)   Extension Strengthening;Both;10 reps;Theraband   Theraband Level (Shoulder Extension) Level 3 (Green)   Row Both;10 reps;Strengthening;Theraband   Theraband Level (Shoulder Row) Level 3 (Green)   Shoulder Exercises: Pulleys   Flexion 2 minutes   ABduction 2 minutes   Shoulder Exercises: ROM/Strengthening   UBE (Upper Arm Bike) L2: 2 min forward, 1 min backward    Shoulder Exercises: Stretch   Other Shoulder Stretches supine Lt hand behind head  x  10 sec hold x 5 reps;  Standing IR Lt hand behind back with assistance from RUE x    Moist Heat Therapy   Number Minutes Moist Heat 12 Minutes   Moist Heat Location Shoulder  Lt   Manual Therapy   Manual therapy comments pt supine   Soft tissue mobilization Contract/Relax ER/IR, TPR to Lt subscap, infraspinatus, teres major and periscapular muscles to Lt UE.                 PT Education - 10/31/15 0940    Education provided Yes   Education Details HEP - included band color for each exercises of current HEP   Person(s) Educated Patient   Methods Handout;Demonstration;Explanation   Comprehension Returned demonstration;Verbalized understanding          PT Short Term Goals - 09/30/15 1019    PT SHORT TERM GOAL #1   Title Improve posture and alignment working to strengtheing posterior shoulder girdle musculature 10/02/14   Time 6   Period Weeks   Status On-going   PT SHORT TERM GOAL #2   Title Patient I in initial HEP 09/19/15   Time 4   Period Weeks   Status Achieved   PT SHORT TERM GOAL #3   Title Patient to incorporate shoulder exercise to pool exercise program 09/19/15   Time 4   Period Weeks   Status Achieved   PT SHORT TERM GOAL #4   Title Increase AROM Lt shoudler by 10-20 degrees throughout 10/03/15   Time 6   Status Achieved           PT Long Term Goals - 10/31/15 0941    PT LONG TERM GOAL #1   Title Increase AROM Lt shoulder to [facility] 11/14/15   Time 12   Period Weeks   Status Achieved   PT LONG TERM GOAL #2   Title Functional strength allowing patient to reach up into low cabinet 11/14/15   Time 12   Period Weeks   Status Achieved   PT LONG TERM GOAL #3   Title Use Lt UE for crafts and light household chores 11/14/15   Time 12   Period Weeks   Status Achieved   PT LONG TERM GOAL #4   Title I in HEP for discharge including water exercise 11/14/15   Time 12   Period Weeks   Status Achieved   PT LONG TERM GOAL #5   Title FOTO </= 36%  limitation 11/14/15   Time 12   Period Weeks   Status Not Met  pt scored 37% limited               Plan - 10/31/15 0949    Clinical Impression Statement Pt demonstrated improved Lt shoulder ROM this visit. Able to tolerate increased resistance with  LUE during exercise. Pt has partially met her goals, but reports she is satisfied with current level of function and requests to d/c to HEP.    Pt will benefit from skilled therapeutic intervention in order to improve on the following deficits Postural dysfunction;Improper body mechanics;Decreased range of motion;Decreased mobility;Decreased strength;Pain;Increased fascial restricitons;Decreased endurance;Decreased activity tolerance   Rehab Potential Good   PT Frequency 2x / week   PT Duration 12 weeks   PT Treatment/Interventions Patient/family education;ADLs/Self Care Home Management;Therapeutic exercise;Therapeutic activities;Manual techniques;Neuromuscular re-education;Dry needling;Cryotherapy;Electrical Stimulation;Moist Heat;Ultrasound   PT Next Visit Plan Spoke to supervising PT; will d/c to HEP.    Consulted and Agree with Plan of Care Patient        Problem List Patient Active Problem List   Diagnosis Date Noted  . Postoperative hypotension 07/12/2015  . Glenohumeral arthritis 07/10/2015  . Upper airway cough syndrome 07/03/2015  . Fever blister 11/06/2014  . Breath shortness 02/01/2014  . Chronic diastolic heart failure ([place]) 12/25/2013  . Cardiomyopathy ([place]) 12/21/2013  . Pulmonary fibrosis ([place]) 10/16/2013  . TR (tricuspid regurgitation) 10/16/2013  . Congestive heart failure with left ventricular diastolic dysfunction ([place]) 08/27/2013  . Secondary pulmonary hypertension ([place]) 08/27/2013  . CAFL (chronic airflow limitation) ([place]) 08/16/2013  . Acid reflux 08/16/2013  . MI (mitral incompetence) 06/30/2011  . History of recurrent UTI (urinary tract infection) 04/21/2010  . Atrial fibrillation ([place]) 05/13/2009  .  OSTEOPOROSIS 05/13/2009  . HERPES ZOSTER 02/28/2009    Kerin Perna, PTA 10/31/2015 9:51 AM  [facility] [place], [place], [address] Phone: [phone]   Fax:  [phone]  Name: Patrycja Mumpower MRN: [id number] Date of Birth: [birth year]    PHYSICAL THERAPY DISCHARGE SUMMARY  Visits from Start of Care: 19  Current functional level related to goals / functional outcomes: I in HEP and returned to functional activity level   Remaining deficits: Needs to continue with I HEP to gain additional strength   Education / Equipment: HEP/theraband Plan: Patient agrees to discharge.  Patient goals were partially met. Patient is being discharged due to being pleased with the current functional level.  ?????    Celyn P. Helene Kelp PT, MPH 10/31/2015 12:03 PM

## 2015-11-11 ENCOUNTER — Other Ambulatory Visit: Payer: Self-pay | Admitting: *Deleted

## 2015-11-11 DIAGNOSIS — I482 Chronic atrial fibrillation, unspecified: Secondary | ICD-10-CM

## 2015-11-13 LAB — PROTIME-INR
INR: 2.85 — ABNORMAL HIGH (ref <1.50)
Prothrombin Time: 30.3 seconds — ABNORMAL HIGH (ref 11.6–15.2)

## 2015-11-16 ENCOUNTER — Other Ambulatory Visit: Payer: Self-pay | Admitting: Family Medicine

## 2015-12-03 ENCOUNTER — Other Ambulatory Visit: Payer: Self-pay | Admitting: Family Medicine

## 2015-12-04 ENCOUNTER — Ambulatory Visit (INDEPENDENT_AMBULATORY_CARE_PROVIDER_SITE_OTHER): Payer: Medicare Other | Admitting: Family Medicine

## 2015-12-04 ENCOUNTER — Encounter: Payer: Self-pay | Admitting: Family Medicine

## 2015-12-04 ENCOUNTER — Ambulatory Visit (INDEPENDENT_AMBULATORY_CARE_PROVIDER_SITE_OTHER): Payer: Medicare Other

## 2015-12-04 VITALS — BP 130/92 | HR 83 | Ht 63.0 in | Wt 164.0 lb

## 2015-12-04 DIAGNOSIS — I482 Chronic atrial fibrillation, unspecified: Secondary | ICD-10-CM

## 2015-12-04 DIAGNOSIS — I5032 Chronic diastolic (congestive) heart failure: Secondary | ICD-10-CM

## 2015-12-04 DIAGNOSIS — M25572 Pain in left ankle and joints of left foot: Secondary | ICD-10-CM | POA: Diagnosis not present

## 2015-12-04 DIAGNOSIS — M25571 Pain in right ankle and joints of right foot: Secondary | ICD-10-CM

## 2015-12-04 DIAGNOSIS — R635 Abnormal weight gain: Secondary | ICD-10-CM

## 2015-12-04 LAB — BASIC METABOLIC PANEL WITH GFR
BUN: 19 mg/dL (ref 7–25)
CALCIUM: 9.2 mg/dL (ref 8.6–10.4)
CO2: 26 mmol/L (ref 20–31)
Chloride: 105 mmol/L (ref 98–110)
Creat: 0.89 mg/dL — ABNORMAL HIGH (ref 0.60–0.88)
GFR, EST AFRICAN AMERICAN: 69 mL/min (ref >=60)
GFR, Est Non African American: 60 mL/min (ref >=60)
GLUCOSE: 91 mg/dL (ref 65–99)
POTASSIUM: 4.4 mmol/L (ref 3.5–5.3)
Sodium: 138 mmol/L (ref 135–146)

## 2015-12-04 LAB — TSH: TSH: 1.48 m[IU]/L

## 2015-12-04 MED ORDER — MELOXICAM 7.5 MG PO TABS: 7.5000 mg | ORAL_TABLET | Freq: Every day | ORAL | Status: DC

## 2015-12-04 NOTE — Progress Notes (Addendum)
   Subjective:    Patient ID: Terri Small, female    DOB: [birth year], 82 y.o.   MRN: [id number]  HPI Bilateral foot pain x 2-3 weeks around both lateral ankles  . She also hurts in the bend of her foot on the left foot.  She also c/o of pain on the top of  Her foot slightly latearl that  feels like a sharp "hot" pain that has been going for years..  Rates her pain 9/10  Tylenol is not helping with her pain. No truamo or injury.  No recent swelling.  No hx of gouty, etc.  Hx of OA in multiple joints.  Had tried cold water soaks as well.   She is also very concerned about weight gain. He does try to be as active as she can and says she eats very small portion sizes and can't understand why she has gained about 4 pounds over the last 3-1/2 months. She is a lifetime member at [employer] but says she would really just prefer to have a plan to follow. She would really like to lose to tell her what to eat and then she follows it.  Review of Systems     Objective:   Physical Exam  Constitutional: She is oriented to person, place, and time. She appears well-developed and well-nourished.  HENT:  Head: Normocephalic and atraumatic.  Eyes: Conjunctivae and EOM are normal.  Cardiovascular: Normal rate.   Pulmonary/Chest: Effort normal.  Musculoskeletal:  Both ankles with trace edema.  Tender just anterior to the lateral malleolus bilaterally.  nontender posteriorly.  Non tender over the talus and flexor tendons. ROM is normal bilat no inc laxity with ant drawer. Strength is 5/5 bilarerall in all directions.   Neurological: She is alert and oriented to person, place, and time.  Skin: Skin is dry. No pallor.  Psychiatric: She has a normal mood and affect. Her behavior is normal.  Vitals reviewed.         Assessment & Plan:  Bilateral foot and ankle pain - she has a new pain around the latearal malleolus tha tis new.  Will get xrays to eval for OA.  Ok to use NSIAD PRN but warned about inc  bleeding risk with NSAIDs and coumadin. Use sparingly and only when tylenol is not working.  Pressure to make sure that she is taking it with food and stop immediately if any GI upset or irritation and only use sparingly.  Foot pain on dorsum x several years.  Consider tarsal tunnel syndrome though no pain posterior to the malleolus. She has no numbness but does get occasional sharp shooting pains.  Abnormal weight gain-will check thyroid just to rule out other causes. Consider nutrition referral. It's difficult because she is on Coumadin and also has heart failure so she has some special dietary measures and just wants to be able to eat a healthy diet and control her weight.

## 2015-12-05 NOTE — Addendum Note (Signed)
Addended by: Beatrice Lecher D on: 12/05/2015 07:43 AM   Modules accepted: Orders

## 2015-12-09 ENCOUNTER — Telehealth: Payer: Self-pay

## 2015-12-09 DIAGNOSIS — I482 Chronic atrial fibrillation, unspecified: Secondary | ICD-10-CM

## 2015-12-09 NOTE — Telephone Encounter (Signed)
Patient needed the PT released.

## 2015-12-10 ENCOUNTER — Ambulatory Visit (INDEPENDENT_AMBULATORY_CARE_PROVIDER_SITE_OTHER): Payer: Medicare Other | Admitting: Family Medicine

## 2015-12-10 ENCOUNTER — Ambulatory Visit (INDEPENDENT_AMBULATORY_CARE_PROVIDER_SITE_OTHER): Payer: Medicare Other

## 2015-12-10 ENCOUNTER — Encounter: Payer: Self-pay | Admitting: Family Medicine

## 2015-12-10 VITALS — BP 104/57 | HR 68 | Wt 163.0 lb

## 2015-12-10 DIAGNOSIS — M79671 Pain in right foot: Secondary | ICD-10-CM

## 2015-12-10 DIAGNOSIS — M79672 Pain in left foot: Secondary | ICD-10-CM | POA: Diagnosis not present

## 2015-12-10 LAB — URIC ACID: URIC ACID, SERUM: 5.9 mg/dL (ref 2.4–7.0)

## 2015-12-10 LAB — PROTIME-INR
INR: 3.04 — AB (ref <1.50)
Prothrombin Time: 31.9 seconds — ABNORMAL HIGH (ref 11.6–15.2)

## 2015-12-10 NOTE — Progress Notes (Signed)
Quick Note:  Arthritis present in the feet both sides like we thought ______

## 2015-12-10 NOTE — Progress Notes (Signed)
   Subjective:    I'm seeing this patient as a consultation for:  Dr. Madilyn Fireman  CC: Bilateral foot and ankle pain.  HPI: Patient has a history of chronic intermittent bilateral foot and ankle pain. This is for the most of the time very well controlled with Tylenol and does not interfere with her quality of life. Over the last several weeks she's had worsening bilateral ankle pain and swelling. She was evaluated by her PCP recently for ankle pain and swelling. She had x-rays that were largely unremarkable.  She was treated empirically with medium dose meloxicam. This has worked remarkably well. She notes her ankle pain has essentially resolved. She's actually stopped taking the meloxicam daily because her pain is gone away. She does note some mild intermittent mid foot pain and swelling. She uses orthotics in her shoes and does water aerobics. She is quite happy with how things are going.  Past medical history, Surgical history, Family history not pertinant except as noted below, Social history, Allergies, and medications have been entered into the medical record, reviewed, and no changes needed.   Review of Systems: No headache, visual changes, nausea, vomiting, diarrhea, constipation, dizziness, abdominal pain, skin rash, fevers, chills, night sweats, weight loss, swollen lymph nodes, body aches, joint swelling, muscle aches, chest pain, shortness of breath, mood changes, visual or auditory hallucinations.   Objective:    Filed Vitals:   12/10/15 0937  BP: 104/57  Pulse: 68   General: Well Developed, well nourished, and in no acute distress.  Neuro/Psych: Alert and oriented x3, extra-ocular muscles intact, able to move all 4 extremities, sensation grossly intact. Skin: Warm and dry, no rashes noted.  Respiratory: Not using accessory muscles, speaking in full sentences, trachea midline.  Cardiovascular: Pulses palpable, no extremity edema. Abdomen: Does not appear distended. MSK: Foot and  ankle have superficial venous varices bilaterally. There is mild swelling of the ankle joints bilaterally. There is also possibility and swelling of the tarsal metatarsal joints dorsally bilaterally. Additionally patient has pes planus and pronation bilaterally. She has several hammertoe deformities bilaterally as well. The lateral ankle bilaterally is mildly tender but not remarkably so. She has stable ligamentous exam. She additionally is some mild tenderness to palpation at the tarsometatarsal [place] and swelling sites bilaterally. Symptoms are mild. Pulses capillary refill and sensation intact distally.  X-ray ankles bilaterally dated 12/04/2015 reviewed.  No results found for this or any previous visit (from the past 24 hour(s)). No results found.  Impression and Recommendations:   This case required medical decision making of moderate complexity.

## 2015-12-10 NOTE — Assessment & Plan Note (Signed)
Likely related to DJD. Foot x-ray pending. Additionally urine casts attending to evaluate for possible gout. Patient is doing quite well with her current management. Continue intermittent meloxicam. Will call patient with results. Return as needed.

## 2015-12-10 NOTE — Patient Instructions (Signed)
Thank you for coming in today. Get xray of the foot today.  Get labs.  Return as needed.  We will call with results.

## 2015-12-11 ENCOUNTER — Ambulatory Visit: Payer: Self-pay | Admitting: Family Medicine

## 2015-12-11 DIAGNOSIS — I482 Chronic atrial fibrillation, unspecified: Secondary | ICD-10-CM

## 2015-12-11 NOTE — Progress Notes (Signed)
Quick Note:  Uric acid level is normal. ______

## 2015-12-12 ENCOUNTER — Telehealth: Payer: Self-pay | Admitting: *Deleted

## 2015-12-12 NOTE — Telephone Encounter (Signed)
Decrease dose. Take half a tab on Sat and Tue. Take 5 mg all other days.  Repeat in 2 weeks.    Pt informed of above med change.Terri Small

## 2015-12-23 ENCOUNTER — Other Ambulatory Visit: Payer: Self-pay | Admitting: Family Medicine

## 2015-12-23 DIAGNOSIS — I482 Chronic atrial fibrillation, unspecified: Secondary | ICD-10-CM

## 2015-12-26 ENCOUNTER — Ambulatory Visit (INDEPENDENT_AMBULATORY_CARE_PROVIDER_SITE_OTHER): Payer: Medicare Other | Admitting: Osteopathic Medicine

## 2015-12-26 ENCOUNTER — Encounter: Payer: Self-pay | Admitting: *Deleted

## 2015-12-26 ENCOUNTER — Telehealth: Payer: Self-pay

## 2015-12-26 ENCOUNTER — Encounter: Payer: Self-pay | Admitting: Osteopathic Medicine

## 2015-12-26 VITALS — BP 100/62 | HR 85 | Wt 161.0 lb

## 2015-12-26 DIAGNOSIS — L739 Follicular disorder, unspecified: Secondary | ICD-10-CM

## 2015-12-26 LAB — PROTIME-INR
INR: 2.13 — AB (ref <1.50)
PROTHROMBIN TIME: 24.1 s — AB (ref 11.6–15.2)

## 2015-12-26 MED ORDER — LIDOCAINE HCL 2 % EX GEL: 1.0000 "application " | CUTANEOUS | Status: DC | PRN

## 2015-12-26 MED ORDER — LIDOCAINE 5 % EX OINT: 1.0000 "application " | TOPICAL_OINTMENT | Freq: Four times a day (QID) | CUTANEOUS | Status: DC | PRN

## 2015-12-26 MED ORDER — CEPHALEXIN 500 MG PO CAPS: 500.0000 mg | ORAL_CAPSULE | Freq: Four times a day (QID) | ORAL | Status: DC

## 2015-12-26 NOTE — Addendum Note (Signed)
Addended by: Maryla Morrow on: 12/26/2015 11:57 AM   Modules accepted: Orders, Medications

## 2015-12-26 NOTE — Telephone Encounter (Signed)
Patient called state that she can not afford the ointment medication. Patient said tghat she was not going to get it and she wants to know if there is something that she buy over the counter and use. Please advise. Rhonda Cunningham,CMA

## 2015-12-26 NOTE — Patient Instructions (Signed)
We are increasing dose of keflex to treat this infection - see printed prescription. HOLD your usual dose of Keflex while you are on the higher dose for skin infection, and we can restart that lower dose for the UTI's once the high-dose course id finished.   Return to clinic for further evaluation if bump gets bigger and more painful and isn't draining, if you develops worse pain or fever/chills. Otherwise, ok to let this drain on its own if it does. You can use the topical lidocaine for pain relief. You can use warm compresses a few times per day to help ease pain and promote healing.

## 2015-12-26 NOTE — Progress Notes (Signed)
HPI: Terri Small is a 82 y.o. female who presents to [facility] today for chief complaint of: No chief complaint on file.   . Location: buttock to L of anus . Quality: bump . Context: pt concerned bc she is on blood thinners  . Modifying factors: on abx for UTI ppx . Assoc signs/symptoms: no fever/chlils, no drainage from the skin lesion   Past medical, social and family history reviewed: Past Medical History  Diagnosis Date  . Atrial fibrillation ([place])     s/p 2x ablation on chronic coumadin, previously on amiodarone but had pulmonary SE.  . Osteoporosis   . Herpes zoster   . Fracture, fibula 11/26/2013  . Fracture, sternum closed 11/26/13  . Fracture of lateral malleolus 11/17/2013  . Dysrhythmia   . CHF (congestive heart failure) ([place])   . Shortness of breath dyspnea   . GERD (gastroesophageal reflux disease)   . Diverticulitis   . Headache   . COPD (chronic obstructive pulmonary disease) [facility])     "pulmonologist told me last week my lungs are good" (07/10/2015)  . Arthritis     "everywhere"   Past Surgical History  Procedure Laterality Date  . Colonoscopy    . Total shoulder arthroplasty Left 07/10/2015  . Joint replacement    . Laparoscopic cholecystectomy  ~ 2009  . Vaginal hysterectomy    . Atrial fibrillation ablation  ~ 2002; ~ 2004  . Cardiac catheterization  ~ 2002; ~ 2004  . Total shoulder arthroplasty Left 07/10/2015    Procedure: LEFT TOTAL SHOULDER ARTHROPLASTY;  Surgeon: Tania Ade, MD;  Location: [facility];  Service: Orthopedics;  Laterality: Left;  Left shoulder arthroplasty   Social History  Substance Use Topics  . Smoking status: Former Smoker -- 1.00 packs/day for 6 years    Types: Cigarettes    Quit date: 10/18/1960  . Smokeless tobacco: Never Used  . Alcohol Use: 2.4 oz/week    2 Standard drinks or equivalent, 2 Glasses of wine per week   Family History  Problem Relation Age of Onset  . Stroke Father   .  Atrial fibrillation    . Emphysema Father     Current Outpatient Prescriptions  Medication Sig Dispense Refill  . diltiazem (CARDIZEM CD) 240 MG 24 hr capsule TAKE ONE CAPSULE BY MOUTH EVERY DAY 90 capsule 0  . furosemide (LASIX) 20 MG tablet TAKE 1 TABLET BY MOUTH DAILY 30 tablet 2  . glucosamine-chondroitin 500-400 MG tablet Take 1 tablet by mouth daily.     . meloxicam (MOBIC) 7.5 MG tablet Take 1 tablet (7.5 mg total) by mouth daily. 30 tablet 0  . metoprolol succinate (TOPROL-XL) 50 MG 24 hr tablet Take 50 mg by mouth daily.    . Multiple Vitamins-Minerals (PRESERVISION AREDS 2 PO) Take 1 tablet by mouth 2 (two) times daily.    Marland Kitchen warfarin (COUMADIN) 5 MG tablet TAKE 1 TABLET BY MOUTH EVERY DAY 90 tablet 2   No current facility-administered medications for this visit.   Allergies  Allergen Reactions  . Nexium [Esomeprazole Magnesium] Other (See Comments)    Chest heaviness.  . Sulfonamide Derivatives Other (See Comments)    REACTION: rash- burning on face  . Nitrofurantoin Other (See Comments)    Pulmonary fibrosis       Review of Systems: CONSTITUTIONAL:  No  fever, no chills, No  unintentional weight changes HEAD/EYES/EARS/NOSE/THROAT: No  headache, no vision change CARDIAC: No  chest pain RESPIRATORY: No  cough,  No  shortness of breath/wheeze GASTROINTESTINAL: No  nausea, No  vomiting, No  abdominal pain GENITOURINARY: No  abnormal genital bleeding/discharge SKIN: (+) rash/wounds/concerning lesions see HPI HEM/ONC: No  easy bruising/bleeding, No  abnormal lymph node  Exam:  BP 100/62 mmHg  Pulse 85  Wt 161 lb (73.029 kg) Constitutional: VS see above. General Appearance: alert, well-developed, well-nourished, NAD Skin: 1 cm induraded (+) erythema on skin to L of anus in gluteal cleft c/w mild cellulitis/folliculitis. SKin otherwise warm, dry, intact. No rash/ulcer. No concerning nevi or subq nodules on limited exam.   Psychiatric: Normal judgment/insight. Normal mood  and affect. Oriented x3.    ASSESSMENT/PLAN: Hold UTI ppx dose Keflex, use higher dose. Low risk MRSA. Home instructions given and reviewed w/ patient. RTC/ER precautions reviewed. Don't think it's worth it to try lancing, don't think we'll get much in the way of fluid or pus, risking cutting procedure on coumadin, probably would cause more pain than benefit. Trial watchful waiting, abx and pain control.   Folliculitis - Plan: lidocaine (XYLOCAINE) 5 % ointment, cephALEXin (KEFLEX) 500 MG capsule   Return if symptoms worsen or fail to improve.

## 2015-12-26 NOTE — Telephone Encounter (Signed)
Can send lidocaine in different formulation to [facility] which, when combined w/ GoodRx coupon, should be no more than $14 if she is ok with that.

## 2015-12-26 NOTE — Telephone Encounter (Signed)
Rx has been faxed to pharmacy. Janeal Abadi,CMA  

## 2015-12-29 NOTE — Addendum Note (Signed)
Addended by: Terance Hart on: 12/29/2015 08:57 AM   Modules accepted: Orders

## 2015-12-30 ENCOUNTER — Telehealth: Payer: Self-pay | Admitting: *Deleted

## 2015-12-30 NOTE — Telephone Encounter (Signed)
disregard PA. See previous phone note

## 2016-01-21 ENCOUNTER — Telehealth: Payer: Self-pay | Admitting: *Deleted

## 2016-01-21 ENCOUNTER — Ambulatory Visit: Payer: Self-pay | Admitting: Family Medicine

## 2016-01-21 DIAGNOSIS — I482 Chronic atrial fibrillation, unspecified: Secondary | ICD-10-CM

## 2016-01-21 LAB — PROTIME-INR
INR: 2.12 — ABNORMAL HIGH (ref <1.50)
PROTHROMBIN TIME: 24 s — AB (ref 11.6–15.2)

## 2016-01-21 NOTE — Telephone Encounter (Signed)
Pt given dosing and f/u recommendations by A. Tuttle.Terri Small

## 2016-02-16 ENCOUNTER — Other Ambulatory Visit: Payer: Self-pay | Admitting: *Deleted

## 2016-02-16 DIAGNOSIS — I482 Chronic atrial fibrillation, unspecified: Secondary | ICD-10-CM

## 2016-02-18 ENCOUNTER — Ambulatory Visit: Payer: Self-pay | Admitting: Family Medicine

## 2016-02-18 ENCOUNTER — Telehealth: Payer: Self-pay | Admitting: Family Medicine

## 2016-02-18 ENCOUNTER — Telehealth: Payer: Self-pay | Admitting: *Deleted

## 2016-02-18 LAB — PROTIME-INR
INR: 2.62 — AB (ref <1.50)
Prothrombin Time: 28.4 seconds — ABNORMAL HIGH (ref 11.6–15.2)

## 2016-02-18 NOTE — Telephone Encounter (Signed)
error 

## 2016-02-18 NOTE — Progress Notes (Signed)
   Subjective:    Patient ID: Terri Small, female    DOB: [birth year], 82 y.o.   MRN: [id number]  HPI    Review of Systems     Objective:   Physical Exam        Assessment & Plan:

## 2016-02-18 NOTE — Telephone Encounter (Signed)
Pt informed of results she is to rtn in 1 mo.Terri Small

## 2016-02-20 ENCOUNTER — Other Ambulatory Visit: Payer: Self-pay | Admitting: Family Medicine

## 2016-03-12 ENCOUNTER — Ambulatory Visit (INDEPENDENT_AMBULATORY_CARE_PROVIDER_SITE_OTHER): Payer: Medicare Other | Admitting: Family Medicine

## 2016-03-12 VITALS — BP 110/51 | HR 71 | Wt 151.0 lb

## 2016-03-12 DIAGNOSIS — Z0000 Encounter for general adult medical examination without abnormal findings: Secondary | ICD-10-CM

## 2016-03-12 DIAGNOSIS — I482 Chronic atrial fibrillation, unspecified: Secondary | ICD-10-CM

## 2016-03-12 DIAGNOSIS — D0462 Carcinoma in situ of skin of left upper limb, including shoulder: Secondary | ICD-10-CM | POA: Diagnosis not present

## 2016-03-12 DIAGNOSIS — C4492 Squamous cell carcinoma of skin, unspecified: Secondary | ICD-10-CM | POA: Diagnosis not present

## 2016-03-12 DIAGNOSIS — I5032 Chronic diastolic (congestive) heart failure: Secondary | ICD-10-CM

## 2016-03-12 DIAGNOSIS — L989 Disorder of the skin and subcutaneous tissue, unspecified: Secondary | ICD-10-CM

## 2016-03-12 MED ORDER — CEPHALEXIN 250 MG PO CAPS: 250.0000 mg | ORAL_CAPSULE | Freq: Every day | ORAL | Status: DC

## 2016-03-12 NOTE — Addendum Note (Signed)
Addended by: Teddy Spike on: 03/12/2016 03:14 PM   Modules accepted: Orders

## 2016-03-12 NOTE — Patient Instructions (Signed)
Keep wound covered for 24 hours if able. Okay to remove the bandage and get wet in the shower. Do not scrub at the area. Pat dry. Don't apply any alcohol or peroxide products.  Apply a small amount of Vaseline twice a day.

## 2016-03-12 NOTE — Progress Notes (Deleted)
Subjective:   Terri Small is a 82 y.o. female who presents for Medicare Annual (Subsequent) preventive examination.  Review of Systems:  ***       Objective:     Vitals: There were no vitals taken for this visit.  There is no weight on file to calculate BMI.   Tobacco History  Smoking status  . Former Smoker -- 1.00 packs/day for 6 years  . Types: Cigarettes  . Quit date: 10/18/1960  Smokeless tobacco  . Never Used     Counseling given: Not Answered   Past Medical History  Diagnosis Date  . Atrial fibrillation ([place])     s/p 2x ablation on chronic coumadin, previously on amiodarone but had pulmonary SE.  . Osteoporosis   . Herpes zoster   . Fracture, fibula 11/26/2013  . Fracture, sternum closed 11/26/13  . Fracture of lateral malleolus 11/17/2013  . Dysrhythmia   . CHF (congestive heart failure) ([place])   . Shortness of breath dyspnea   . GERD (gastroesophageal reflux disease)   . Diverticulitis   . Headache   . COPD (chronic obstructive pulmonary disease) [facility])     "pulmonologist told me last week my lungs are good" (07/10/2015)  . Arthritis     "everywhere"   Past Surgical History  Procedure Laterality Date  . Colonoscopy    . Total shoulder arthroplasty Left 07/10/2015  . Joint replacement    . Laparoscopic cholecystectomy  ~ 2009  . Vaginal hysterectomy    . Atrial fibrillation ablation  ~ 2002; ~ 2004  . Cardiac catheterization  ~ 2002; ~ 2004  . Total shoulder arthroplasty Left 07/10/2015    Procedure: LEFT TOTAL SHOULDER ARTHROPLASTY;  Surgeon: Tania Ade, MD;  Location: [place];  Service: Orthopedics;  Laterality: Left;  Left shoulder arthroplasty   Family History  Problem Relation Age of Onset  . Stroke Father   . Atrial fibrillation    . Emphysema Father    History  Sexual Activity  . Sexual Activity: No    Outpatient Encounter Prescriptions as of 03/12/2016  Medication Sig  . cephALEXin (KEFLEX) 250 MG capsule Take 250 mg by mouth daily.   . cephALEXin (KEFLEX) 500 MG capsule Take 1 capsule (500 mg total) by mouth 4 (four) times daily. FOR SKIN INFECTION - HOLD OTHER KEFLEX (CEPHALEXIN) DOSE USED FOR UTI PREVENTION  . diltiazem (CARDIZEM CD) 240 MG 24 hr capsule TAKE ONE CAPSULE BY MOUTH EVERY DAY  . furosemide (LASIX) 20 MG tablet TAKE 1 TABLET BY MOUTH DAILY  . glucosamine-chondroitin 500-400 MG tablet Take 1 tablet by mouth daily.   Marland Kitchen lidocaine (XYLOCAINE) 2 % jelly Apply 1 application topically as needed.  . meloxicam (MOBIC) 7.5 MG tablet Take 1 tablet (7.5 mg total) by mouth daily.  . metoprolol succinate (TOPROL-XL) 50 MG 24 hr tablet Take 50 mg by mouth daily.  . Multiple Vitamins-Minerals (PRESERVISION AREDS 2 PO) Take 1 tablet by mouth 2 (two) times daily.  Marland Kitchen warfarin (COUMADIN) 5 MG tablet TAKE 1 TABLET BY MOUTH EVERY DAY   No facility-administered encounter medications on file as of 03/12/2016.    Activities of Daily Living In your present state of health, do you have any difficulty performing the following activities: 07/10/2015 07/10/2015  Hearing? - N  Vision? - N  Difficulty concentrating or making decisions? - N  Walking or climbing stairs? - Y  Dressing or bathing? - N  Doing errands, shopping? N -    Patient Care  Team: Hali Marry, MD as PCP - General Elsie Stain, MD as Consulting Physician (Pulmonary Disease) Ellis Parents, MD as Attending Physician (Cardiology) Bethann Goo, MD as Physician Assistant (Urology)    Assessment:    Medicare Wellness Exam  Exercise Activities and Dietary recommendations    Goals    None     Fall Risk Fall Risk  06/17/2014 05/31/2013  Falls in the past year? No No   Depression Screen PHQ 2/9 Scores 06/17/2014 05/31/2013  PHQ - 2 Score 0 0     Cognitive Testing No flowsheet data found.  Immunization History  Administered Date(s) Administered  . Influenza Split 08/21/2012  . Influenza Whole 08/13/2009, 07/24/2010  . Influenza,inj,Quad  PF,36+ Mos 07/03/2013, 08/14/2014, 06/27/2015  . Pneumococcal Conjugate-13 10/29/2014  . Pneumococcal Polysaccharide-23 10/18/2002  . Tdap 02/24/2009  . Zoster 10/18/2012   Screening Tests Health Maintenance  Topic Date Due  . INFLUENZA VACCINE  05/18/2016  . COLONOSCOPY  06/05/2017  . TETANUS/TDAP  02/25/2019  . DEXA SCAN  Completed  . ZOSTAVAX  Completed  . PNA vac Low Risk Adult  Completed      Plan:   *** During the course of the visit the patient was educated and counseled about the following appropriate screening and preventive services:   Vaccines to include Pneumoccal, Influenza, Hepatitis B, Td, Zostavax, HCV  Electrocardiogram  Cardiovascular Disease  Colorectal cancer screening  Bone density screening  Diabetes screening  Glaucoma screening  Mammography/PAP  Nutrition counseling   Patient Instructions (the written plan) was given to the patient.   Nickolaus Bordelon, MD  03/12/2016

## 2016-03-12 NOTE — Progress Notes (Signed)
   Subjective:    Patient ID: Terri Small, female    DOB: 30-Jan-1931, 82 y.o.   MRN: [id number]  HPI Here for rash on both upper arms.  His noticed a red scaly lesion on the left upper arm that's been there for several months. She sees a new one on the right upper arm that's not quite as large. She says it they get itchy and irritated at times. No bleeding. No prior history of skin cancers.  She also wanted to know if I would be willing to refill her Keflex 250 mg that she takes daily for UTI prophylaxis. She is actually going out of town to visit her daughter in [place] for a couple of months and wants to stay on it until she can get back in with the urologist when she gets back to town. He had only given her six-month supply.  Atrial fibrillation-she's currently on Coumadin and doing well without any side effects or problems. She denies any easy bleeding or bruising.  She is planning on traveling this summer to visit her daughter in [place].  Review of Systems     Objective:   Physical Exam  Constitutional: She is oriented to person, place, and time. She appears well-developed and well-nourished.  HENT:  Head: Normocephalic and atraumatic.  Eyes: Conjunctivae and EOM are normal.  Cardiovascular: Normal rate.   Pulmonary/Chest: Effort normal.  Neurological: She is alert and oriented to person, place, and time.  Skin: Skin is dry. No pallor.  She has a red scaled lesion on left upper arm and similar lesion on the right upper outer arm but smaller.    Psychiatric: She has a normal mood and affect. Her behavior is normal.  Vitals reviewed.         Assessment & Plan:  Actinic keratosis versus early squamous cell skin cancer-recommend shave biopsy of the lesion on the left upper arm since it's been there longer and its larger. If it does come back to stent actinic keratosis and we can treat the one on the right shoulder but just cryotherapy.  UTI prophylaxis-we'll continue Keflex. Did  encourage her to get back in with urology once she is back in town at the end of the summer.  A. fib and congestive heart failure-she is due for up-to-date blood work. Lab slip printed.  Shave Biopsy Procedure Note  Pre-operative Diagnosis: Actinic keratosis?  Post-operative Diagnosis: same  Locations:left upper outer arm.   Indications: itching and pain  Anesthesia: Lidocaine 1% with epinephrine with added sodium bicarbonate  Procedure Details  Patient informed of the risks (including bleeding and infection) and benefits of the  procedure and Verbal informed consent obtained.  The lesion and surrounding area were given a sterile prep using chlorhexidine and draped in the usual sterile fashion. A scalpel was used to shave an area of skin approximately 1cm by 0.5cm.  Hemostasis achieved with alumuninum chloride. Antibiotic ointment and a sterile dressing applied.  The specimen was sent for pathologic examination. The patient tolerated the procedure well.  EBL: trace  Findings: Await pathology  Condition: Stable  Complications: none.  Plan: 1. Instructed to keep the wound dry and covered for 24-48h and clean thereafter. 2. Warning signs of infection were reviewed.   3. Recommended that the patient use OTC acetaminophen as needed for pain.  4. Return PRN.

## 2016-03-17 MED ORDER — IMIQUIMOD 5 % EX CREA: TOPICAL_CREAM | CUTANEOUS | Status: DC

## 2016-03-17 NOTE — Addendum Note (Signed)
Addended by: Beatrice Lecher D on: 03/17/2016 04:13 PM   Modules accepted: Orders

## 2016-03-18 ENCOUNTER — Other Ambulatory Visit: Payer: Self-pay | Admitting: Family Medicine

## 2016-03-18 DIAGNOSIS — C4492 Squamous cell carcinoma of skin, unspecified: Secondary | ICD-10-CM

## 2016-03-18 NOTE — Addendum Note (Signed)
Addended by: Beatrice Lecher D on: 03/18/2016 12:07 PM   Modules accepted: Orders

## 2016-03-22 ENCOUNTER — Ambulatory Visit (INDEPENDENT_AMBULATORY_CARE_PROVIDER_SITE_OTHER): Payer: Medicare Other | Admitting: Osteopathic Medicine

## 2016-03-22 ENCOUNTER — Encounter: Payer: Self-pay | Admitting: Osteopathic Medicine

## 2016-03-22 VITALS — BP 84/54 | HR 66 | Ht 63.0 in | Wt 150.0 lb

## 2016-03-22 DIAGNOSIS — L989 Disorder of the skin and subcutaneous tissue, unspecified: Secondary | ICD-10-CM | POA: Diagnosis not present

## 2016-03-22 DIAGNOSIS — C4492 Squamous cell carcinoma of skin, unspecified: Secondary | ICD-10-CM | POA: Diagnosis not present

## 2016-03-22 NOTE — Progress Notes (Signed)
HPI: Terri Small is a 82 y.o. female who presents to [facility] today for chief complaint of:  Chief Complaint  Patient presents with  . Wound Infection     . Location: L upper arm . Quality: soreness, concerned about redness and yellowish look . Context: recent shave biopsu, (+) squamous cell skin cancer, on Imiquimod and referred to [facility] . Modifying factors: Imiquimod, also using vaseline between those days.  . Assoc signs/symptoms: no fever/chills, no drainage. Irritation from band-aid adhseive.        Past medical, social and family history reviewed: Past Medical History  Diagnosis Date  . Atrial fibrillation ([place])     s/p 2x ablation on chronic coumadin, previously on amiodarone but had pulmonary SE.  . Osteoporosis   . Herpes zoster   . Fracture, fibula 11/26/2013  . Fracture, sternum closed 11/26/13  . Fracture of lateral malleolus 11/17/2013  . Dysrhythmia   . CHF (congestive heart failure) ([place])   . Shortness of breath dyspnea   . GERD (gastroesophageal reflux disease)   . Diverticulitis   . Headache   . COPD (chronic obstructive pulmonary disease) [facility])     "pulmonologist told me last week my lungs are good" (07/10/2015)  . Arthritis     "everywhere"   Past Surgical History  Procedure Laterality Date  . Colonoscopy    . Total shoulder arthroplasty Left 07/10/2015  . Joint replacement    . Laparoscopic cholecystectomy  ~ 2009  . Vaginal hysterectomy    . Atrial fibrillation ablation  ~ 2002; ~ 2004  . Cardiac catheterization  ~ 2002; ~ 2004  . Total shoulder arthroplasty Left 07/10/2015    Procedure: LEFT TOTAL SHOULDER ARTHROPLASTY;  Surgeon: Tania Ade, MD;  Location: [place];  Service: Orthopedics;  Laterality: Left;  Left shoulder arthroplasty   Social History  Substance Use Topics  . Smoking status: Former Smoker -- 1.00 packs/day for 6 years    Types: Cigarettes    Quit date: 10/18/1960  . Smokeless tobacco: Never  Used  . Alcohol Use: 2.4 oz/week    2 Standard drinks or equivalent, 2 Glasses of wine per week   Family History  Problem Relation Age of Onset  . Stroke Father   . Atrial fibrillation    . Emphysema Father     Current Outpatient Prescriptions  Medication Sig Dispense Refill  . cephALEXin (KEFLEX) 250 MG capsule Take 1 capsule (250 mg total) by mouth daily. 90 capsule 0  . diltiazem (CARDIZEM CD) 240 MG 24 hr capsule TAKE ONE CAPSULE BY MOUTH EVERY DAY 90 capsule 0  . furosemide (LASIX) 20 MG tablet TAKE 1 TABLET BY MOUTH DAILY 30 tablet 2  . glucosamine-chondroitin 500-400 MG tablet Take 1 tablet by mouth daily.     . imiquimod (ALDARA) 5 % cream Apply topically 3 (three) times a week. X 12 weeks 24 each 1  . meloxicam (MOBIC) 7.5 MG tablet Take 1 tablet (7.5 mg total) by mouth daily. 30 tablet 0  . metoprolol succinate (TOPROL-XL) 50 MG 24 hr tablet Take 50 mg by mouth daily.    . Multiple Vitamins-Minerals (PRESERVISION AREDS 2 PO) Take 1 tablet by mouth 2 (two) times daily.    Marland Kitchen warfarin (COUMADIN) 5 MG tablet TAKE 1 TABLET BY MOUTH EVERY DAY 90 tablet 2   No current facility-administered medications for this visit.   Allergies  Allergen Reactions  . Nexium [Esomeprazole Magnesium] Other (See Comments)    Chest  heaviness.  . Sulfonamide Derivatives Other (See Comments)    REACTION: rash- burning on face  . Nitrofurantoin Other (See Comments)    Pulmonary fibrosis       Review of Systems: CONSTITUTIONAL:  No  fever, no chills, No  unintentional weight changes CARDIAC: No  chest pain RESPIRATORY: No  cough GASTROINTESTINAL: No  nausea, No  vomiting, No  abdominal pain MUSCULOSKELETAL: No  myalgia/arthralgia SKIN: (+) rash/wounds/concerning lesions as per HPI   Exam:  BP 84/54 mmHg  Pulse 66  Ht 5\' 3"  (1.6 m)  Wt 150 lb (68.04 kg)  BMI 26.58 kg/m2 Constitutional: VS see above. General Appearance: alert, well-developed, well-nourished, NAD Respiratory: Normal  respiratory effort.  Skin: lesion as in photo s/p shave biopy, mild erythema at edges and granulation tissue is present - no drainage, no extension of erythema beyond what would be expected for normal healing. Skin is otherwise warm, dry, intact. No rash/ulcer. No concerning nevi or subq nodules on limited exam.   Psychiatric: Normal judgment/insight. Normal mood and affect. Oriented x3.      ASSESSMENT/PLAN: Reassured re: normal healing, there may be additional irritation due to the Imiquimod but the skin does not appear to be infected. Keep clean, continue treatment, use nonstick gauze dressing with paper tape may be less irritating. Watch for extension of erythema, drainage, fever or other concerns. 03/31/16 Dermatology appt be sure to keep this. Follow up with Dr. Madilyn Fireman as directed for routine care.   Squamous cell skin cancer  Skin lesion of left arm     All questions were answered. Visit summary with updated medication list and pertinent instructions was printed for patient. ER/RTC precautions were reviewed with the patient. Return if symptoms worsen or fail to improve and for routine follow pu with Dr Madilyn Fireman.

## 2016-03-23 ENCOUNTER — Telehealth: Payer: Self-pay

## 2016-03-23 DIAGNOSIS — H9190 Unspecified hearing loss, unspecified ear: Secondary | ICD-10-CM

## 2016-03-23 NOTE — Telephone Encounter (Signed)
Patient called and would like a referral to [place].

## 2016-03-24 ENCOUNTER — Other Ambulatory Visit: Payer: Self-pay | Admitting: *Deleted

## 2016-03-24 DIAGNOSIS — I482 Chronic atrial fibrillation, unspecified: Secondary | ICD-10-CM

## 2016-03-25 LAB — PROTIME-INR
INR: 1.88 — ABNORMAL HIGH (ref <1.50)
Prothrombin Time: 21.9 seconds — ABNORMAL HIGH (ref 11.6–15.2)

## 2016-03-26 ENCOUNTER — Ambulatory Visit: Payer: Self-pay | Admitting: Family Medicine

## 2016-03-26 DIAGNOSIS — I482 Chronic atrial fibrillation, unspecified: Secondary | ICD-10-CM

## 2016-03-26 LAB — COMPLETE METABOLIC PANEL WITH GFR
ALBUMIN: 3.7 g/dL (ref 3.6–5.1)
ALK PHOS: 73 U/L (ref 33–130)
ALT: 24 U/L (ref 6–29)
AST: 27 U/L (ref 10–35)
BILIRUBIN TOTAL: 0.8 mg/dL (ref 0.2–1.2)
BUN: 22 mg/dL (ref 7–25)
CO2: 26 mmol/L (ref 20–31)
CREATININE: 0.78 mg/dL (ref 0.60–0.88)
Calcium: 9.1 mg/dL (ref 8.6–10.4)
Chloride: 103 mmol/L (ref 98–110)
GFR, EST NON AFRICAN AMERICAN: 71 mL/min (ref >=60)
GFR, Est African American: 81 mL/min (ref >=60)
GLUCOSE: 91 mg/dL (ref 65–99)
Potassium: 4.9 mmol/L (ref 3.5–5.3)
SODIUM: 138 mmol/L (ref 135–146)
TOTAL PROTEIN: 6.3 g/dL (ref 6.1–8.1)

## 2016-03-26 LAB — LIPID PANEL
Cholesterol: 140 mg/dL (ref 125–200)
HDL: 62 mg/dL (ref >=46)
LDL CALC: 66 mg/dL (ref <130)
Total CHOL/HDL Ratio: 2.3 Ratio (ref <=5.0)
Triglycerides: 62 mg/dL (ref <150)
VLDL: 12 mg/dL (ref <30)

## 2016-03-26 NOTE — Progress Notes (Signed)
Left message advising recommendations.

## 2016-04-05 NOTE — Progress Notes (Signed)
Patient advised and scheduled.  

## 2016-04-19 ENCOUNTER — Ambulatory Visit: Payer: Self-pay | Admitting: Family Medicine

## 2016-04-19 ENCOUNTER — Ambulatory Visit (INDEPENDENT_AMBULATORY_CARE_PROVIDER_SITE_OTHER): Payer: Medicare Other | Admitting: Family Medicine

## 2016-04-19 ENCOUNTER — Encounter: Payer: Self-pay | Admitting: Family Medicine

## 2016-04-19 VITALS — BP 131/88 | HR 72 | Wt 153.0 lb

## 2016-04-19 DIAGNOSIS — I4891 Unspecified atrial fibrillation: Secondary | ICD-10-CM

## 2016-04-19 DIAGNOSIS — C4492 Squamous cell carcinoma of skin, unspecified: Secondary | ICD-10-CM | POA: Diagnosis not present

## 2016-04-19 DIAGNOSIS — I482 Chronic atrial fibrillation, unspecified: Secondary | ICD-10-CM

## 2016-04-19 LAB — POCT INR: INR: 2.2

## 2016-04-19 NOTE — Progress Notes (Signed)
Subjective:    CC: wound.   HPI:  Patient had a lesion biopsied on her left upper arm it turned out to be a squamous cell skin cancer. She's been using Aldara on it but it got to the point where was so irritated it actually started bleeding. She is going to the hospital have it checked out when she was out of town. They started putting Xeroform on it and she stopped her Aldara about 4 days ago.  Past medical history, Surgical history, Family history not pertinant except as noted below, Social history, Allergies, and medications have been entered into the medical record, reviewed, and corrections made.   Review of Systems: No fevers, chills, night sweats, weight loss, chest pain, or shortness of breath.   Objective:    General: Well Developed, well nourished, and in no acute distress.  Neuro: Alert and oriented x3, extra-ocular muscles intact, sensation grossly intact.  HEENT: Normocephalic, atraumatic  Skin: Warm and dry, no rashes. Cardiac: Regular rate and rhythm, no murmurs rubs or gallops, no lower extremity edema.  Respiratory: Clear to auscultation bilaterally. Not using accessory muscles, speaking in full sentences. Skin: She does have an approximately 2 cm erythematous lesion with some granulation tissue in the center on her left upper arm. No streaking erythema. Active drainage.   Impression and Recommendations:    Squamous Cell skin cancer-skin appears to be healing well. Just encourage her to discontinue use for now just apply small amount of Vaseline.   Atrial fibrillation-Coumadin level looks great today. Continue current regimen. Recheck in 4 weeks.

## 2016-04-21 ENCOUNTER — Other Ambulatory Visit: Payer: Self-pay | Admitting: *Deleted

## 2016-04-21 ENCOUNTER — Telehealth: Payer: Self-pay | Admitting: Family Medicine

## 2016-04-21 ENCOUNTER — Ambulatory Visit: Payer: Medicare Other

## 2016-04-21 DIAGNOSIS — I482 Chronic atrial fibrillation, unspecified: Secondary | ICD-10-CM

## 2016-04-21 NOTE — Telephone Encounter (Signed)
Pt called. She is going to [place] today/tomorrow and would like to have Order to have blood draws she going to gone for a couple of months. She would like to come by to collect today.  Thanks

## 2016-04-21 NOTE — Telephone Encounter (Signed)
Pt given lab slip.Audelia Hives [place]

## 2016-04-21 NOTE — Telephone Encounter (Signed)
Ok to place and printe standing order for every 2 weeks.

## 2016-05-19 LAB — POCT INR: INR: 2.4 — AB (ref 0.9–1.1)

## 2016-05-20 ENCOUNTER — Encounter: Payer: Self-pay | Admitting: *Deleted

## 2016-05-20 ENCOUNTER — Telehealth: Payer: Self-pay | Admitting: *Deleted

## 2016-05-20 LAB — PROTIME-INR
INR: 2.4 — AB (ref 0.9–1.1)
PT: 24.7

## 2016-05-20 NOTE — Telephone Encounter (Signed)
Pt advised to continue with current dosage and recheck in 1 month.Terri Small

## 2016-05-24 ENCOUNTER — Other Ambulatory Visit: Payer: Self-pay | Admitting: Family Medicine

## 2016-06-08 ENCOUNTER — Encounter: Payer: Self-pay | Admitting: Family Medicine

## 2016-06-08 ENCOUNTER — Ambulatory Visit (INDEPENDENT_AMBULATORY_CARE_PROVIDER_SITE_OTHER): Payer: Medicare Other | Admitting: Family Medicine

## 2016-06-08 ENCOUNTER — Telehealth: Payer: Self-pay | Admitting: *Deleted

## 2016-06-08 VITALS — BP 119/66 | HR 97 | Wt 158.0 lb

## 2016-06-08 DIAGNOSIS — S8002XA Contusion of left knee, initial encounter: Secondary | ICD-10-CM | POA: Diagnosis not present

## 2016-06-08 DIAGNOSIS — Z23 Encounter for immunization: Secondary | ICD-10-CM | POA: Diagnosis not present

## 2016-06-08 DIAGNOSIS — I4891 Unspecified atrial fibrillation: Secondary | ICD-10-CM | POA: Diagnosis not present

## 2016-06-08 DIAGNOSIS — I5032 Chronic diastolic (congestive) heart failure: Secondary | ICD-10-CM

## 2016-06-08 LAB — POCT INR: INR: 1.7

## 2016-06-08 NOTE — Progress Notes (Signed)
Pt here for INR check, pt reports that she did not take coumadin on Sunday or Monday, diet changes,,bleeding,CP,SOB.Audelia Hives [place]

## 2016-06-08 NOTE — Telephone Encounter (Signed)
Error

## 2016-06-08 NOTE — Patient Instructions (Signed)
Recheck coumadin in 10 days. Restart regular regimen.

## 2016-06-08 NOTE — Progress Notes (Signed)
Subjective:    CC: Hospital F/U  HPI: Patient is following up today for recent ED visit.  She feel in her daughter's house. She was lifting lid on garbage can and slipped on marble floors.  She was seen at a hospital in [place]. She reports that she did not fracture anything that it was just badly bruised and they recommended that she wear compression sleeve. She was told to take naproxen at the emergency department says she's been using that for pain relief.  Atrial fibrillation-INR on Sunday was around 3 so they held her dose on Sunday and Monday.  She was told to follow-up to decide when to restart Coumadin.  CHF- no recent palms or increase in swelling. She said she hasn't taken her Lasix and months as she fell she hasn't needed to.  Also planning on having right hip replacement probably in early October. Recurrent urinary tract infections-she's actually been weaning her Keflex down on her own. She started taking it every other day and eventually every third day and now she has been off of it for about a week. She started to get some urgency and frequency and did a home kit. It tested positive only for leukocytes. She gave a few days and the symptoms actually resolved on their own. No fevers or chills etc.    Past medical history, Surgical history, Family history not pertinant except as noted below, Social history, Allergies, and medications have been entered into the medical record, reviewed, and corrections made.   Review of Systems: No fevers, chills, night sweats, weight loss, chest pain, or shortness of breath.   Objective:    General: Well Developed, well nourished, and in no acute distress.  Neuro: Alert and oriented x3, extra-ocular muscles intact, sensation grossly intact.  HEENT: Normocephalic, atraumatic  Skin: Warm and dry, no rashes. Cardiac: Regular rate and rhythm, no murmurs rubs or gallops, no lower extremity edema.  Respiratory: Clear to auscultation bilaterally. Not  using accessory muscles, speaking in full sentences. Ext: Very large contusion over her left knee. Feels warm and hot to touch. She is tender directly over the patella and just below the patella. Mild tenderness over the joint lines. No tenderness over the anterior tibia for Muscle.   Impression and Recommendations:   Left knee contusion-she did have the orthopedist look at it today. Comparison continue with compression wrappings. Recommend stop naproxen and just switch to Tylenol for pain control. Continue with elevation icing and compression.  Atrial fibrillation-restart Coumadin and recheck level in 10 days.  Congestive heart failure with diastolic dysfunction-she really does have some trace edema in her right ankle and her left ankle. We can restart the Lasix short-term especially for the swelling in the left leg which is secondary to the large contusion over her left knee. But we'll see if she really needs it more long-term. Says she still has some at home so encouraged her to take it every other day for the next week.  Recurrent UTI-she wants to see how she does off of the Keflex. Certainly think that's reasonable. We can monitor for frequency and restart if needed.

## 2016-06-18 ENCOUNTER — Ambulatory Visit (INDEPENDENT_AMBULATORY_CARE_PROVIDER_SITE_OTHER): Payer: Medicare Other | Admitting: Family Medicine

## 2016-06-18 VITALS — BP 106/67 | HR 73 | Temp 98.1°F | Resp 16

## 2016-06-18 DIAGNOSIS — I482 Chronic atrial fibrillation, unspecified: Secondary | ICD-10-CM

## 2016-06-18 DIAGNOSIS — R300 Dysuria: Secondary | ICD-10-CM

## 2016-06-18 LAB — POCT URINALYSIS DIPSTICK
Bilirubin, UA: NEGATIVE
GLUCOSE UA: NEGATIVE
Nitrite, UA: NEGATIVE
PROTEIN UA: 30
RBC UA: NEGATIVE
Urobilinogen, UA: 1
pH, UA: 5.5

## 2016-06-18 LAB — POCT INR: INR: 2.5

## 2016-06-18 NOTE — Progress Notes (Signed)
Patient here for INR check for anticoagulation. She's currently on Coumadin. She also mentioned that she was having some dysuria started this morning. No pelvic pain. No fever. She's had a prior history of recurrent UTIs and was on prophylaxis until more recently. She was only able to get a minimal urine sample

## 2016-06-23 ENCOUNTER — Other Ambulatory Visit: Payer: Self-pay | Admitting: Orthopedic Surgery

## 2016-07-03 ENCOUNTER — Other Ambulatory Visit: Payer: Self-pay | Admitting: Family Medicine

## 2016-07-09 ENCOUNTER — Encounter: Payer: Self-pay | Admitting: Family Medicine

## 2016-07-12 ENCOUNTER — Encounter (HOSPITAL_COMMUNITY): Payer: Self-pay

## 2016-07-12 ENCOUNTER — Encounter (HOSPITAL_COMMUNITY)
Admission: RE | Admit: 2016-07-12 | Discharge: 2016-07-12 | Disposition: A | Discharge status: Home / Self Care | Payer: Medicare Other | Source: Ambulatory Visit | Attending: Orthopedic Surgery | Admitting: Orthopedic Surgery

## 2016-07-12 ENCOUNTER — Ambulatory Visit (HOSPITAL_COMMUNITY)
Admission: RE | Admit: 2016-07-12 | Discharge: 2016-07-12 | Disposition: A | Discharge status: Home / Self Care | Payer: Medicare Other | Source: Ambulatory Visit | Attending: Orthopedic Surgery | Admitting: Orthopedic Surgery

## 2016-07-12 DIAGNOSIS — I700 Atherosclerosis of aorta: Secondary | ICD-10-CM | POA: Diagnosis not present

## 2016-07-12 DIAGNOSIS — Z01810 Encounter for preprocedural cardiovascular examination: Secondary | ICD-10-CM | POA: Insufficient documentation

## 2016-07-12 DIAGNOSIS — Z01818 Encounter for other preprocedural examination: Secondary | ICD-10-CM | POA: Insufficient documentation

## 2016-07-12 DIAGNOSIS — I482 Chronic atrial fibrillation: Secondary | ICD-10-CM | POA: Insufficient documentation

## 2016-07-12 DIAGNOSIS — Z01812 Encounter for preprocedural laboratory examination: Secondary | ICD-10-CM | POA: Insufficient documentation

## 2016-07-12 DIAGNOSIS — Z7901 Long term (current) use of anticoagulants: Secondary | ICD-10-CM | POA: Diagnosis not present

## 2016-07-12 LAB — SURGICAL PCR SCREEN
MRSA, PCR: NEGATIVE
STAPHYLOCOCCUS AUREUS: NEGATIVE

## 2016-07-12 LAB — CBC WITH DIFFERENTIAL/PLATELET
BASOS PCT: 0 %
Basophils Absolute: 0 10*3/uL (ref 0.0–0.1)
EOS ABS: 0.1 10*3/uL (ref 0.0–0.7)
Eosinophils Relative: 2 %
HEMATOCRIT: 41.1 % (ref 36.0–46.0)
HEMOGLOBIN: 13.3 g/dL (ref 12.0–15.0)
Lymphocytes Relative: 26 %
Lymphs Abs: 1.6 10*3/uL (ref 0.7–4.0)
MCH: 32.3 pg (ref 26.0–34.0)
MCHC: 32.4 g/dL (ref 30.0–36.0)
MCV: 99.8 fL (ref 78.0–100.0)
MONO ABS: 0.5 10*3/uL (ref 0.1–1.0)
MONOS PCT: 7 %
NEUTROS ABS: 3.9 10*3/uL (ref 1.7–7.7)
NEUTROS PCT: 65 %
Platelets: 213 10*3/uL (ref 150–400)
RBC: 4.12 MIL/uL (ref 3.87–5.11)
RDW: 13.6 % (ref 11.5–15.5)
WBC: 6.1 10*3/uL (ref 4.0–10.5)

## 2016-07-12 LAB — BASIC METABOLIC PANEL
ANION GAP: 7 (ref 5–15)
BUN: 26 mg/dL — ABNORMAL HIGH (ref 6–20)
CALCIUM: 9.3 mg/dL (ref 8.9–10.3)
CO2: 25 mmol/L (ref 22–32)
CREATININE: 1 mg/dL (ref 0.44–1.00)
Chloride: 102 mmol/L (ref 101–111)
GFR, EST AFRICAN AMERICAN: 59 mL/min — AB (ref >60)
GFR, EST NON AFRICAN AMERICAN: 51 mL/min — AB (ref >60)
GLUCOSE: 113 mg/dL — AB (ref 65–99)
Potassium: 4.6 mmol/L (ref 3.5–5.1)
Sodium: 134 mmol/L — ABNORMAL LOW (ref 135–145)

## 2016-07-12 LAB — URINALYSIS, ROUTINE W REFLEX MICROSCOPIC
Bilirubin Urine: NEGATIVE
Glucose, UA: NEGATIVE mg/dL
Hgb urine dipstick: NEGATIVE
KETONES UR: NEGATIVE mg/dL
LEUKOCYTES UA: NEGATIVE
NITRITE: NEGATIVE
PROTEIN: NEGATIVE mg/dL
Specific Gravity, Urine: 1.022 (ref 1.005–1.030)
pH: 5.5 (ref 5.0–8.0)

## 2016-07-12 LAB — APTT: APTT: 66 s — AB (ref 24–36)

## 2016-07-12 LAB — TYPE AND SCREEN
ABO/RH(D): O POS
Antibody Screen: NEGATIVE

## 2016-07-12 LAB — PROTIME-INR
INR: 2.69
Prothrombin Time: 29.2 seconds — ABNORMAL HIGH (ref 11.4–15.2)

## 2016-07-12 NOTE — Pre-Procedure Instructions (Signed)
    Terri Small  07/12/2016      [id number] - [place], [place] - [address] [place] [address] Phone: [phone] Fax: [place] [place] [address] [address] [place] [address] Phone: [phone] Fax: [phone]    Your procedure is scheduled on 07/12/16.  Report to [facility] Admitting at 730 A.M.  Call this number if you have problems the morning of surgery:  [phone]   Remember:  Do not eat food or drink liquids after midnight.  Take these medicines the morning of surgery with A SIP OF WATER --tylenol,cardizem,metoprolol   Do not wear jewelry, make-up or nail polish.  Do not wear lotions, powders, or perfumes, or deoderant.  Do not shave 48 hours prior to surgery.  Men may shave face and neck.  Do not bring valuables to the hospital.  [facility] is not responsible for any belongings or valuables.  Contacts, dentures or bridgework may not be worn into surgery.  Leave your suitcase in the car.  After surgery it may be brought to your room.  For patients admitted to the hospital, discharge time will be determined by your treatment team.  Patients discharged the day of surgery will not be allowed to drive home.   Name and phone number of your driver:    Special instructions:  Do not take any aspirin,anti-inflammatories,vitamins,or herbal supplements 5-7 days prior to surgery.  Please read over the following fact sheets that you were given. MRSA Information

## 2016-07-13 LAB — ABO/RH: ABO/RH(D): O POS

## 2016-07-13 NOTE — Progress Notes (Addendum)
Anesthesia Chart Review:  Pt is an 82 year old female scheduled for R total hip arthroplasty anterior approach on 07/23/2016 with Frederik Pear, MD.   - PCP is Beatrice Lecher, MD.  - Cardiologist is Dr. Lamar Blinks, last office visit 06/28/16, who has cleared pt for surgery at low risk.  PMH includes: chronic atrial fibrillation (s/p ablation x2), CHF, COPD, GERD. Former smoker. BMI 27. S/p L total shoulder arthroplasty 07/10/15.  Medications include: diltiazem, metoprolol, coumadin. Pt to stop coumadin 5 days before surgery.   Preoperative labs reviewed.  PT 29.2, PTT 66. These will be repeated DOS after pt has been off coumadin.   Chest x-ray 07/03/2015 reviewed. Basal pleural-parenchymal thickening consistent with scarring. No acute cardiopulmonary disease. Stable cardiomegaly.  EKG 06/28/16 ([facility]): atrial fibrillation (55 bpm). Poor R wave progression. Low voltage, possible pulmonary disease  Echo 09/08/15 ([facility]):  - LV diastolic function normal - LA severely dilated - RA moderately dilated - Moderate (2+) mitral regurgitation - Moderately severe to severe (3-4+) tricuspid regurgitation - RV systolic pressure is elevated between 50-60 mmHg, consistent with moderately severe pulmonary HTN - Mild aortic sclerosis is present with good valvular opening - LV is normal in size. There is normal wall thickness. Mild diffuse hypokinesis of LV. EF mildly reduced 45-50% * Note by Cathlean Sauer, NP 09/08/15 in care everywhere indicates echo not significantly changed from 2015 echo (see my note dated 07/04/15 for that report)  Nuclear stress test 09/26/2013: -There is a mild, small anterior apical and midanterior defect that is fixed and consistent with breast attenuation. No reversible perfusion defects are identified.  If no changes, I anticipate pt can proceed with surgery as scheduled.   Willeen Cass, FNP-BC [facility]iology Phone:  [phone] 07/14/2016 3:43 PM

## 2016-07-16 ENCOUNTER — Ambulatory Visit (INDEPENDENT_AMBULATORY_CARE_PROVIDER_SITE_OTHER): Payer: Medicare Other | Admitting: Family Medicine

## 2016-07-16 DIAGNOSIS — I482 Chronic atrial fibrillation, unspecified: Secondary | ICD-10-CM

## 2016-07-16 LAB — POCT INR: INR: 2.9

## 2016-07-20 NOTE — Progress Notes (Signed)
Patient advised of results and recommendations.  

## 2016-07-20 NOTE — Progress Notes (Signed)
Left message advising of recommendations.  

## 2016-07-21 NOTE — H&P (Signed)
TOTAL HIP ADMISSION H&P  Patient is admitted for right total hip arthroplasty.  Subjective:  Chief Complaint: right hip pain  HPI: Terri Small, 82 y.o. female, has a history of pain and functional disability in the right hip(s) due to arthritis and patient has failed non-surgical conservative treatments for greater than 12 weeks to include NSAID's and/or analgesics, corticosteriod injections, use of assistive devices and activity modification.  Onset of symptoms was gradual starting 2 years ago with gradually worsening course since that time.The patient noted no past surgery on the right hip(s).  Patient currently rates pain in the right hip at 10 out of 10 with activity. Patient has night pain, worsening of pain with activity and weight bearing, trendelenberg gait, pain that interfers with activities of daily living and pain with passive range of motion. Patient has evidence of subchondral sclerosis and joint space narrowing by imaging studies. This condition presents safety issues increasing the risk of falls.  There is no current active infection.  Patient Active Problem List   Diagnosis Date Noted  . Skin lesion of left arm 03/22/2016  . Squamous cell skin cancer 03/22/2016  . Foot pain, bilateral 12/10/2015  . Glenohumeral arthritis 07/10/2015  . Upper airway cough syndrome 07/03/2015  . Fever blister 11/06/2014  . Chronic diastolic heart failure ([place]) 12/25/2013  . Cardiomyopathy ([place]) 12/21/2013  . Pulmonary fibrosis ([place]) 10/16/2013  . TR (tricuspid regurgitation) 10/16/2013  . Congestive heart failure with left ventricular diastolic dysfunction ([place]) 08/27/2013  . Secondary pulmonary hypertension 08/27/2013  . CAFL (chronic airflow limitation) ([place]) 08/16/2013  . Acid reflux 08/16/2013  . MI (mitral incompetence) 06/30/2011  . History of recurrent UTI (urinary tract infection) 04/21/2010  . Atrial fibrillation ([place]) 05/13/2009  . OSTEOPOROSIS 05/13/2009  . HERPES ZOSTER  02/28/2009   Past Medical History:  Diagnosis Date  . Arthritis    "everywhere"  . Atrial fibrillation ([place])    s/p 2x ablation on chronic coumadin, previously on amiodarone but had pulmonary SE.  Marland Kitchen CHF (congestive heart failure) ([place])   . COPD (chronic obstructive pulmonary disease) [facility])    "pulmonologist told me last week my lungs are good" (07/10/2015)  . Diverticulitis   . Dysrhythmia   . Fracture of lateral malleolus 11/17/2013  . Fracture, fibula 11/26/2013  . Fracture, sternum closed 11/26/13  . GERD (gastroesophageal reflux disease)   . Headache   . Herpes zoster   . Osteoporosis   . Shortness of breath dyspnea     Past Surgical History:  Procedure Laterality Date  . ATRIAL FIBRILLATION ABLATION  ~ 2002; ~ 2004  . CARDIAC CATHETERIZATION  ~ 2002; ~ 2004  . COLONOSCOPY    . JOINT REPLACEMENT    . LAPAROSCOPIC CHOLECYSTECTOMY  ~ 2009  . TOTAL SHOULDER ARTHROPLASTY Left 07/10/2015  . TOTAL SHOULDER ARTHROPLASTY Left 07/10/2015   Procedure: LEFT TOTAL SHOULDER ARTHROPLASTY;  Surgeon: Tania Ade, MD;  Location: [facility];  Service: Orthopedics;  Laterality: Left;  Left shoulder arthroplasty  . VAGINAL HYSTERECTOMY      No prescriptions prior to admission.   Allergies  Allergen Reactions  . Nexium [Esomeprazole Magnesium] Other (See Comments)    Chest heaviness.  . Sulfonamide Derivatives Other (See Comments)    REACTION: rash- burning on face  . Nitrofurantoin Other (See Comments)    Pulmonary fibrosis     Social History  Substance Use Topics  . Smoking status: Former Smoker    Packs/day: 1.00    Years: 6.00    Types:  Cigarettes    Quit date: 10/18/1960  . Smokeless tobacco: Never Used  . Alcohol use 2.4 oz/week    2 Glasses of wine, 2 Standard drinks or equivalent per week    Family History  Problem Relation Age of Onset  . Stroke Father   . Emphysema Father   . Atrial fibrillation       Review of Systems  Constitutional: Negative.   HENT: Negative.    Eyes: Negative.   Respiratory: Negative.   Cardiovascular: Negative.   Gastrointestinal: Negative.   Genitourinary: Positive for urgency.  Musculoskeletal: Positive for joint pain.  Skin: Negative.   Neurological: Negative.   Endo/Heme/Allergies: Negative.   Psychiatric/Behavioral: Negative.     Objective:  Physical Exam  Constitutional: She is oriented to person, place, and time. She appears well-developed and well-nourished.  HENT:  Head: Normocephalic and atraumatic.  Eyes: Pupils are equal, round, and reactive to light.  Neck: Normal range of motion. Neck supple.  Cardiovascular: Intact distal pulses.   Respiratory: Effort normal.  Musculoskeletal: She exhibits tenderness.  the patient's left hip has no pain with flexion extension internal/external rotation and log roll.  Patient's right hip does have obvious discomfort with internal rotation.  Internal rotation to approximately 10-15.    Neurological: She is alert and oriented to person, place, and time.  Skin: Skin is warm and dry.  Psychiatric: She has a normal mood and affect. Her behavior is normal. Judgment and thought content normal.    Vital signs in last 24 hours:    Labs:   Estimated body mass index is 26.91 kg/m as calculated from the following:   Height as of 07/12/16: 5\' 3"  (1.6 m).   Weight as of 07/12/16: 68.9 kg (151 lb 14.4 oz).   Imaging Review Plain radiographs demonstrate significant medial pole arthritis with subchondral sclerosis of the right hip.   Assessment/Plan:  End stage arthritis, right hip(s)  The patient history, physical examination, clinical judgement of the provider and imaging studies are consistent with end stage degenerative joint disease of the right hip(s) and total hip arthroplasty is deemed medically necessary. The treatment options including medical management, injection therapy, arthroscopy and arthroplasty were discussed at length. The risks and benefits of total hip  arthroplasty were presented and reviewed. The risks due to aseptic loosening, infection, stiffness, dislocation/subluxation,  thromboembolic complications and other imponderables were discussed.  The patient acknowledged the explanation, agreed to proceed with the plan and consent was signed. Patient is being admitted for inpatient treatment for surgery, pain control, PT, OT, prophylactic antibiotics, VTE prophylaxis, progressive ambulation and ADL's and discharge planning.The patient is planning to be discharged to skilled nursing facility

## 2016-07-22 DIAGNOSIS — M1611 Unilateral primary osteoarthritis, right hip: Secondary | ICD-10-CM | POA: Diagnosis present

## 2016-07-23 ENCOUNTER — Inpatient Hospital Stay (HOSPITAL_COMMUNITY): Payer: Medicare Other | Admitting: Emergency Medicine

## 2016-07-23 ENCOUNTER — Inpatient Hospital Stay (HOSPITAL_COMMUNITY): Payer: Medicare Other

## 2016-07-23 ENCOUNTER — Inpatient Hospital Stay (HOSPITAL_COMMUNITY)
Admission: RE | Admit: 2016-07-23 | Discharge: 2016-07-25 | DRG: 470 | Disposition: A | Discharge status: Home Health | Payer: Medicare Other | Source: Ambulatory Visit | Attending: Orthopedic Surgery | Admitting: Orthopedic Surgery

## 2016-07-23 ENCOUNTER — Encounter (HOSPITAL_COMMUNITY)
Admission: RE | Disposition: A | Discharge status: Home Health | Payer: Self-pay | Source: Ambulatory Visit | Attending: Orthopedic Surgery

## 2016-07-23 ENCOUNTER — Encounter (HOSPITAL_COMMUNITY): Payer: Self-pay | Admitting: Urology

## 2016-07-23 DIAGNOSIS — Z882 Allergy status to sulfonamides status: Secondary | ICD-10-CM | POA: Diagnosis not present

## 2016-07-23 DIAGNOSIS — Z87891 Personal history of nicotine dependence: Secondary | ICD-10-CM

## 2016-07-23 DIAGNOSIS — I5032 Chronic diastolic (congestive) heart failure: Secondary | ICD-10-CM | POA: Diagnosis present

## 2016-07-23 DIAGNOSIS — Z888 Allergy status to other drugs, medicaments and biological substances status: Secondary | ICD-10-CM

## 2016-07-23 DIAGNOSIS — I110 Hypertensive heart disease with heart failure: Secondary | ICD-10-CM | POA: Diagnosis present

## 2016-07-23 DIAGNOSIS — Z419 Encounter for procedure for purposes other than remedying health state, unspecified: Secondary | ICD-10-CM

## 2016-07-23 DIAGNOSIS — Z96612 Presence of left artificial shoulder joint: Secondary | ICD-10-CM | POA: Diagnosis present

## 2016-07-23 DIAGNOSIS — J449 Chronic obstructive pulmonary disease, unspecified: Secondary | ICD-10-CM | POA: Diagnosis present

## 2016-07-23 DIAGNOSIS — Z90710 Acquired absence of both cervix and uterus: Secondary | ICD-10-CM

## 2016-07-23 DIAGNOSIS — M810 Age-related osteoporosis without current pathological fracture: Secondary | ICD-10-CM | POA: Diagnosis present

## 2016-07-23 DIAGNOSIS — I2729 Other secondary pulmonary hypertension: Secondary | ICD-10-CM | POA: Diagnosis present

## 2016-07-23 DIAGNOSIS — Y92234 Operating room of hospital as the place of occurrence of the external cause: Secondary | ICD-10-CM | POA: Diagnosis not present

## 2016-07-23 DIAGNOSIS — R2689 Other abnormalities of gait and mobility: Secondary | ICD-10-CM

## 2016-07-23 DIAGNOSIS — M1611 Unilateral primary osteoarthritis, right hip: Principal | ICD-10-CM | POA: Diagnosis present

## 2016-07-23 DIAGNOSIS — Z7901 Long term (current) use of anticoagulants: Secondary | ICD-10-CM | POA: Diagnosis not present

## 2016-07-23 DIAGNOSIS — I429 Cardiomyopathy, unspecified: Secondary | ICD-10-CM | POA: Diagnosis present

## 2016-07-23 DIAGNOSIS — J8410 Pulmonary fibrosis, unspecified: Secondary | ICD-10-CM | POA: Diagnosis present

## 2016-07-23 DIAGNOSIS — I959 Hypotension, unspecified: Secondary | ICD-10-CM | POA: Diagnosis present

## 2016-07-23 DIAGNOSIS — T8189XA Other complications of procedures, not elsewhere classified, initial encounter: Secondary | ICD-10-CM | POA: Diagnosis not present

## 2016-07-23 DIAGNOSIS — M9669 Fracture of other bone following insertion of orthopedic implant, joint prosthesis, or bone plate: Secondary | ICD-10-CM | POA: Diagnosis not present

## 2016-07-23 DIAGNOSIS — I4891 Unspecified atrial fibrillation: Secondary | ICD-10-CM | POA: Diagnosis present

## 2016-07-23 DIAGNOSIS — Y838 Other surgical procedures as the cause of abnormal reaction of the patient, or of later complication, without mention of misadventure at the time of the procedure: Secondary | ICD-10-CM | POA: Diagnosis not present

## 2016-07-23 HISTORY — PX: TOTAL HIP ARTHROPLASTY: SHX124

## 2016-07-23 LAB — PROTIME-INR
INR: 1.18
Prothrombin Time: 15 seconds (ref 11.4–15.2)

## 2016-07-23 LAB — APTT: aPTT: 41 seconds — ABNORMAL HIGH (ref 24–36)

## 2016-07-23 SURGERY — ARTHROPLASTY, HIP, TOTAL, ANTERIOR APPROACH
Anesthesia: Spinal | Site: Hip | Laterality: Right

## 2016-07-23 MED ORDER — METOPROLOL SUCCINATE ER 25 MG PO TB24
25.0000 mg | ORAL_TABLET | Freq: Every evening | ORAL | Status: DC
  Administered 2016-07-23 – 2016-07-24 (×2): 25 mg via ORAL
  Filled 2016-07-23 (×2): qty 1

## 2016-07-23 MED ORDER — PHENYLEPHRINE HCL 10 MG/ML IJ SOLN
INTRAMUSCULAR | Status: DC | PRN
  Administered 2016-07-23: 40 ug via INTRAVENOUS
  Administered 2016-07-23 (×2): 80 ug via INTRAVENOUS

## 2016-07-23 MED ORDER — BUPIVACAINE IN DEXTROSE 0.75-8.25 % IT SOLN
INTRATHECAL | Status: DC | PRN
  Administered 2016-07-23: 2 mL via INTRATHECAL

## 2016-07-23 MED ORDER — KCL IN DEXTROSE-NACL 20-5-0.45 MEQ/L-%-% IV SOLN
INTRAVENOUS | Status: DC
  Administered 2016-07-23: 17:00:00 via INTRAVENOUS
  Filled 2016-07-23: qty 1000

## 2016-07-23 MED ORDER — ACETAMINOPHEN 650 MG RE SUPP: 650.0000 mg | Freq: Four times a day (QID) | RECTAL | Status: DC | PRN

## 2016-07-23 MED ORDER — SOD FLUORIDE-POTASSIUM NITRATE 1.1-5 % DT PSTE: 1.0000 "application " | PASTE | Freq: Every day | DENTAL | Status: DC

## 2016-07-23 MED ORDER — FENTANYL CITRATE (PF) 100 MCG/2ML IJ SOLN: 25.0000 ug | INTRAMUSCULAR | Status: DC | PRN

## 2016-07-23 MED ORDER — TIZANIDINE HCL 2 MG PO TABS: 2.0000 mg | ORAL_TABLET | Freq: Four times a day (QID) | ORAL | 0 refills | Status: DC | PRN

## 2016-07-23 MED ORDER — PROMETHAZINE HCL 25 MG/ML IJ SOLN: 6.2500 mg | INTRAMUSCULAR | Status: DC | PRN

## 2016-07-23 MED ORDER — FLEET ENEMA 7-19 GM/118ML RE ENEM: 1.0000 | ENEMA | Freq: Once | RECTAL | Status: DC | PRN

## 2016-07-23 MED ORDER — COUMADIN BOOK
Freq: Once | Status: AC
  Administered 2016-07-23: 17:00:00
  Filled 2016-07-23: qty 1

## 2016-07-23 MED ORDER — LACTATED RINGERS IV SOLN
INTRAVENOUS | Status: DC
  Administered 2016-07-23 (×2): via INTRAVENOUS

## 2016-07-23 MED ORDER — HYDROMORPHONE HCL 1 MG/ML IJ SOLN: 1.0000 mg | INTRAMUSCULAR | Status: DC | PRN

## 2016-07-23 MED ORDER — ACETAMINOPHEN 325 MG PO TABS
650.0000 mg | ORAL_TABLET | Freq: Four times a day (QID) | ORAL | Status: DC | PRN
  Administered 2016-07-23 – 2016-07-24 (×2): 650 mg via ORAL
  Filled 2016-07-23 (×3): qty 2

## 2016-07-23 MED ORDER — ONDANSETRON HCL 4 MG/2ML IJ SOLN
INTRAMUSCULAR | Status: AC
  Filled 2016-07-23: qty 2

## 2016-07-23 MED ORDER — BUPIVACAINE-EPINEPHRINE 0.25% -1:200000 IJ SOLN
INTRAMUSCULAR | Status: AC
  Filled 2016-07-23: qty 1

## 2016-07-23 MED ORDER — ONDANSETRON HCL 4 MG/2ML IJ SOLN
INTRAMUSCULAR | Status: DC | PRN
  Administered 2016-07-23: 4 mg via INTRAVENOUS

## 2016-07-23 MED ORDER — PROPOFOL 500 MG/50ML IV EMUL
INTRAVENOUS | Status: DC | PRN
  Administered 2016-07-23: 25 ug/kg/min via INTRAVENOUS

## 2016-07-23 MED ORDER — PHENYLEPHRINE 40 MCG/ML (10ML) SYRINGE FOR IV PUSH (FOR BLOOD PRESSURE SUPPORT)
PREFILLED_SYRINGE | INTRAVENOUS | Status: AC
  Filled 2016-07-23: qty 10

## 2016-07-23 MED ORDER — MIDAZOLAM HCL 5 MG/5ML IJ SOLN
INTRAMUSCULAR | Status: DC | PRN
  Administered 2016-07-23 (×2): 1 mg via INTRAVENOUS

## 2016-07-23 MED ORDER — TRANEXAMIC ACID 1000 MG/10ML IV SOLN
INTRAVENOUS | Status: DC | PRN
  Administered 2016-07-23: 2000 mg via TOPICAL

## 2016-07-23 MED ORDER — OXYCODONE HCL 5 MG PO TABS
5.0000 mg | ORAL_TABLET | ORAL | Status: DC | PRN
  Administered 2016-07-23 – 2016-07-24 (×2): 5 mg via ORAL
  Filled 2016-07-23 (×2): qty 1

## 2016-07-23 MED ORDER — ONDANSETRON HCL 4 MG PO TABS: 4.0000 mg | ORAL_TABLET | Freq: Four times a day (QID) | ORAL | Status: DC | PRN

## 2016-07-23 MED ORDER — WARFARIN VIDEO
Freq: Once | Status: AC
  Administered 2016-07-23: 17:00:00

## 2016-07-23 MED ORDER — MIDAZOLAM HCL 2 MG/2ML IJ SOLN
INTRAMUSCULAR | Status: AC
  Filled 2016-07-23: qty 2

## 2016-07-23 MED ORDER — CEFAZOLIN SODIUM-DEXTROSE 2-4 GM/100ML-% IV SOLN
2.0000 g | INTRAVENOUS | Status: AC
  Administered 2016-07-23: 2 g via INTRAVENOUS
  Filled 2016-07-23: qty 100

## 2016-07-23 MED ORDER — SENNOSIDES-DOCUSATE SODIUM 8.6-50 MG PO TABS: 1.0000 | ORAL_TABLET | Freq: Every evening | ORAL | Status: DC | PRN

## 2016-07-23 MED ORDER — CHLORHEXIDINE GLUCONATE 4 % EX LIQD: 60.0000 mL | Freq: Once | CUTANEOUS | Status: DC

## 2016-07-23 MED ORDER — SODIUM CHLORIDE 0.9 % IV SOLN
2000.0000 mg | Freq: Once | INTRAVENOUS | Status: DC
  Filled 2016-07-23: qty 20

## 2016-07-23 MED ORDER — OXYCODONE-ACETAMINOPHEN 5-325 MG PO TABS: 1.0000 | ORAL_TABLET | ORAL | 0 refills | Status: DC | PRN

## 2016-07-23 MED ORDER — WARFARIN SODIUM 5 MG PO TABS
5.0000 mg | ORAL_TABLET | Freq: Once | ORAL | Status: AC
  Administered 2016-07-23: 5 mg via ORAL
  Filled 2016-07-23: qty 1

## 2016-07-23 MED ORDER — MENTHOL 3 MG MT LOZG: 1.0000 | LOZENGE | OROMUCOSAL | Status: DC | PRN

## 2016-07-23 MED ORDER — METOCLOPRAMIDE HCL 5 MG PO TABS: 5.0000 mg | ORAL_TABLET | Freq: Three times a day (TID) | ORAL | Status: DC | PRN

## 2016-07-23 MED ORDER — WARFARIN - PHARMACIST DOSING INPATIENT
Freq: Every day | Status: DC
  Administered 2016-07-23 – 2016-07-24 (×2)

## 2016-07-23 MED ORDER — ONDANSETRON HCL 4 MG/2ML IJ SOLN: 4.0000 mg | Freq: Four times a day (QID) | INTRAMUSCULAR | Status: DC | PRN

## 2016-07-23 MED ORDER — ALUM & MAG HYDROXIDE-SIMETH 200-200-20 MG/5ML PO SUSP: 30.0000 mL | ORAL | Status: DC | PRN

## 2016-07-23 MED ORDER — WARFARIN SODIUM 5 MG PO TABS: 5.0000 mg | ORAL_TABLET | Freq: Every day | ORAL | Status: DC

## 2016-07-23 MED ORDER — DEXAMETHASONE SODIUM PHOSPHATE 10 MG/ML IJ SOLN
10.0000 mg | Freq: Once | INTRAMUSCULAR | Status: AC
  Administered 2016-07-24: 10 mg via INTRAVENOUS
  Filled 2016-07-23: qty 1

## 2016-07-23 MED ORDER — DIPHENHYDRAMINE HCL 12.5 MG/5ML PO ELIX: 12.5000 mg | ORAL_SOLUTION | ORAL | Status: DC | PRN

## 2016-07-23 MED ORDER — PHENYLEPHRINE HCL 10 MG/ML IJ SOLN
INTRAVENOUS | Status: DC | PRN
  Administered 2016-07-23: 20 ug/min via INTRAVENOUS

## 2016-07-23 MED ORDER — PHENOL 1.4 % MT LIQD: 1.0000 | OROMUCOSAL | Status: DC | PRN

## 2016-07-23 MED ORDER — FENTANYL CITRATE (PF) 100 MCG/2ML IJ SOLN
INTRAMUSCULAR | Status: AC
  Filled 2016-07-23: qty 2

## 2016-07-23 MED ORDER — BUPIVACAINE LIPOSOME 1.3 % IJ SUSP
INTRAMUSCULAR | Status: DC | PRN
Start: 2016-07-23 — End: 2016-07-23
  Administered 2016-07-23: 20 mL

## 2016-07-23 MED ORDER — METHOCARBAMOL 1000 MG/10ML IJ SOLN
500.0000 mg | Freq: Four times a day (QID) | INTRAVENOUS | Status: DC | PRN
  Filled 2016-07-23: qty 5

## 2016-07-23 MED ORDER — DEXTROSE-NACL 5-0.45 % IV SOLN: INTRAVENOUS | Status: DC

## 2016-07-23 MED ORDER — DILTIAZEM HCL ER COATED BEADS 120 MG PO CP24
120.0000 mg | ORAL_CAPSULE | Freq: Every day | ORAL | Status: DC
  Administered 2016-07-23 – 2016-07-24 (×2): 120 mg via ORAL
  Filled 2016-07-23 (×4): qty 1

## 2016-07-23 MED ORDER — BISACODYL 5 MG PO TBEC: 5.0000 mg | DELAYED_RELEASE_TABLET | Freq: Every day | ORAL | Status: DC | PRN

## 2016-07-23 MED ORDER — METHOCARBAMOL 500 MG PO TABS
500.0000 mg | ORAL_TABLET | Freq: Four times a day (QID) | ORAL | Status: DC | PRN
  Filled 2016-07-23: qty 1

## 2016-07-23 MED ORDER — BUPIVACAINE LIPOSOME 1.3 % IJ SUSP
20.0000 mL | Freq: Once | INTRAMUSCULAR | Status: DC
Start: 2016-07-23 — End: 2016-07-23
  Filled 2016-07-23: qty 20

## 2016-07-23 MED ORDER — 0.9 % SODIUM CHLORIDE (POUR BTL) OPTIME
TOPICAL | Status: DC | PRN
  Administered 2016-07-23: 1000 mL

## 2016-07-23 MED ORDER — BUPIVACAINE-EPINEPHRINE 0.25% -1:200000 IJ SOLN
INTRAMUSCULAR | Status: DC | PRN
  Administered 2016-07-23: 30 mL

## 2016-07-23 MED ORDER — METOCLOPRAMIDE HCL 5 MG/ML IJ SOLN: 5.0000 mg | Freq: Three times a day (TID) | INTRAMUSCULAR | Status: DC | PRN

## 2016-07-23 MED ORDER — DOCUSATE SODIUM 100 MG PO CAPS
100.0000 mg | ORAL_CAPSULE | Freq: Two times a day (BID) | ORAL | Status: DC
  Administered 2016-07-23 – 2016-07-24 (×3): 100 mg via ORAL
  Filled 2016-07-23 (×5): qty 1

## 2016-07-23 SURGICAL SUPPLY — 47 items
BLADE SURG ROTATE 9660 (MISCELLANEOUS) IMPLANT
CAPT HIP TOTAL 2 ×2 IMPLANT
COVER PERINEAL POST (MISCELLANEOUS) ×2 IMPLANT
COVER SURGICAL LIGHT HANDLE (MISCELLANEOUS) ×2 IMPLANT
DRAPE C-ARM 42X72 X-RAY (DRAPES) ×2 IMPLANT
DRAPE STERI IOBAN 125X83 (DRAPES) ×2 IMPLANT
DRAPE U-SHAPE 47X51 STRL (DRAPES) ×4 IMPLANT
DRSG AQUACEL AG ADV 3.5X10 (GAUZE/BANDAGES/DRESSINGS) ×2 IMPLANT
DURAPREP 26ML APPLICATOR (WOUND CARE) ×2 IMPLANT
ELECT BLADE 4.0 EZ CLEAN MEGAD (MISCELLANEOUS) ×2
ELECT REM PT RETURN 9FT ADLT (ELECTROSURGICAL) ×2
ELECTRODE BLDE 4.0 EZ CLN MEGD (MISCELLANEOUS) ×1 IMPLANT
ELECTRODE REM PT RTRN 9FT ADLT (ELECTROSURGICAL) ×1 IMPLANT
FACESHIELD WRAPAROUND (MASK) ×4 IMPLANT
GLOVE BIO SURGEON STRL SZ7.5 (GLOVE) ×2 IMPLANT
GLOVE BIO SURGEON STRL SZ8.5 (GLOVE) ×2 IMPLANT
GLOVE BIOGEL PI IND STRL 8 (GLOVE) ×1 IMPLANT
GLOVE BIOGEL PI IND STRL 9 (GLOVE) ×1 IMPLANT
GLOVE BIOGEL PI INDICATOR 8 (GLOVE) ×1
GLOVE BIOGEL PI INDICATOR 9 (GLOVE) ×1
GOWN STRL REUS W/ TWL LRG LVL3 (GOWN DISPOSABLE) ×1 IMPLANT
GOWN STRL REUS W/ TWL XL LVL3 (GOWN DISPOSABLE) ×2 IMPLANT
GOWN STRL REUS W/TWL LRG LVL3 (GOWN DISPOSABLE) ×1
GOWN STRL REUS W/TWL XL LVL3 (GOWN DISPOSABLE) ×2
KIT BASIN OR (CUSTOM PROCEDURE TRAY) ×2 IMPLANT
KIT ROOM TURNOVER OR (KITS) ×2 IMPLANT
MANIFOLD NEPTUNE II (INSTRUMENTS) ×2 IMPLANT
NEEDLE 22X1 1/2 OR ONLY (MISCELLANEOUS) ×2
NEEDLE 22X1.5 STRL (OR ONLY) (MISCELLANEOUS) ×2 IMPLANT
NS IRRIG 1000ML POUR BTL (IV SOLUTION) ×2 IMPLANT
PACK TOTAL JOINT (CUSTOM PROCEDURE TRAY) ×2 IMPLANT
PAD ARMBOARD 7.5X6 YLW CONV (MISCELLANEOUS) ×4 IMPLANT
PIN SECT CUP 50MM (Hips) ×2 IMPLANT
SAW OSC TIP CART 19.5X105X1.3 (SAW) ×2 IMPLANT
SCREW PINN CAN BOWN 6.5X8 HIP (Screw) ×2 IMPLANT
SUT VIC AB 1 CTX 36 (SUTURE) ×1
SUT VIC AB 1 CTX36XBRD ANBCTR (SUTURE) ×1 IMPLANT
SUT VIC AB 2-0 CT1 27 (SUTURE) ×2
SUT VIC AB 2-0 CT1 TAPERPNT 27 (SUTURE) ×2 IMPLANT
SUT VIC AB 3-0 PS2 18 (SUTURE) ×1
SUT VIC AB 3-0 PS2 18XBRD (SUTURE) ×1 IMPLANT
SYR CONTROL 10ML LL (SYRINGE) ×4 IMPLANT
TOWEL OR 17X24 6PK STRL BLUE (TOWEL DISPOSABLE) ×2 IMPLANT
TOWEL OR 17X26 10 PK STRL BLUE (TOWEL DISPOSABLE) ×2 IMPLANT
TRAY CATH 16FR W/PLASTIC CATH (SET/KITS/TRAYS/PACK) ×2 IMPLANT
TRAY FOLEY CATH 14FR (SET/KITS/TRAYS/PACK) IMPLANT
WATER STERILE IRR 1000ML POUR (IV SOLUTION) ×2 IMPLANT

## 2016-07-23 NOTE — Anesthesia Preprocedure Evaluation (Signed)
Anesthesia Evaluation  Patient identified by MRN, date of birth, ID band Patient awake    Reviewed: Allergy & Precautions, NPO status , Patient's Chart, lab work & pertinent test results, reviewed documented beta blocker date and time   Airway Mallampati: II  TM Distance: >3 FB Neck ROM: Full    Dental  (+) Teeth Intact, Dental Advisory Given   Pulmonary COPD, former smoker,    breath sounds clear to auscultation       Cardiovascular hypertension, Pt. on medications and Pt. on home beta blockers +CHF  + dysrhythmias Atrial Fibrillation  Rhythm:Irregular Rate:Normal  '15 ECHO; EF 45-50%, mild-mod MR, mod-severe TR   Neuro/Psych    GI/Hepatic GERD  ,  Endo/Other    Renal/GU      Musculoskeletal  (+) Arthritis , Osteoarthritis,    Abdominal   Peds  Hematology  (+) Blood dyscrasia (Coumadin), ,   Anesthesia Other Findings   Reproductive/Obstetrics                             Anesthesia Physical  Anesthesia Plan  ASA: III  Anesthesia Plan: Spinal   Post-op Pain Management:    Induction: Intravenous  Airway Management Planned: Simple Face Mask  Additional Equipment:   Intra-op Plan:   Post-operative Plan: Extubation in OR  Informed Consent: I have reviewed the patients History and Physical, chart, labs and discussed the procedure including the risks, benefits and alternatives for the proposed anesthesia with the patient or authorized representative who has indicated his/her understanding and acceptance.   Dental advisory given  Plan Discussed with: CRNA and Anesthesiologist  Anesthesia Plan Comments:         Anesthesia Quick Evaluation

## 2016-07-23 NOTE — Anesthesia Procedure Notes (Signed)
Procedure Name: MAC Date/Time: 07/23/2016 9:50 AM Performed by: Candis Shine Pre-anesthesia Checklist: Patient identified, Emergency Drugs available, Suction available, Patient being monitored and Timeout performed Patient Re-evaluated:Patient Re-evaluated prior to inductionOxygen Delivery Method: Simple face mask Dental Injury: Teeth and Oropharynx as per pre-operative assessment

## 2016-07-23 NOTE — OR Nursing (Signed)
1200: in&out cath=150cc cyu, no trauma, per protocol

## 2016-07-23 NOTE — Evaluation (Signed)
Physical Therapy Evaluation Patient Details Name: Terri Small MRN: [id number] DOB: 09-Jan-1931 Today's Date: 07/23/2016   History of Present Illness  Pt is an 82 y/o female s/p R THA. PMH including but not limited to CHF, COPD, and L TSA in 2016.  Clinical Impression  Pt presented supine in bed with HOB elevated, awake and willing to participate in therapy session. Prior to admission, pt was independent with all functional mobility. Pt would continue to benefit from skilled physical therapy services at this time while admitted and after d/c to address her below listed limitations in order to improve her overall safety and independence with functional mobility.     Follow Up Recommendations Home health PT;Supervision for mobility/OOB    Equipment Recommendations  Rolling walker with 5" wheels;3in1 (PT)    Recommendations for Other Services       Precautions / Restrictions Precautions Precautions: Fall Restrictions Weight Bearing Restrictions: Yes RLE Weight Bearing: Partial weight bearing RLE Partial Weight Bearing Percentage or Pounds: 50%      Mobility  Bed Mobility Overal bed mobility: Needs Assistance Bed Mobility: Supine to Sit     Supine to sit: Min assist;HOB elevated     General bed mobility comments: pt required increased time and min A for R LE movement  Transfers Overall transfer level: Needs assistance Equipment used: Rolling walker (2 wheeled) Transfers: Sit to/from Stand Sit to Stand: Min guard         General transfer comment: pt required increased time, VC'ing for bilateral hand placement and min guard for safety  Ambulation/Gait Ambulation/Gait assistance: Min guard Ambulation Distance (Feet): 5 Feet Assistive device: Rolling walker (2 wheeled) Gait Pattern/deviations: Step-to pattern;Decreased step length - left;Decreased stance time - right;Decreased weight shift to right Gait velocity: decreased Gait velocity interpretation: Below normal speed  for age/gender General Gait Details: VC'ing to utilize bilateral UEs on RW when standing on R LE to step with L LE as to maintain PWB 50%  Stairs            Wheelchair Mobility    Modified Rankin (Stroke Patients Only)       Balance Overall balance assessment: Needs assistance Sitting-balance support: Feet supported;Bilateral upper extremity supported Sitting balance-Leahy Scale: Poor     Standing balance support: During functional activity;Bilateral upper extremity supported Standing balance-Leahy Scale: Poor Standing balance comment: pt reliant on bilateral UEs on RW                             Pertinent Vitals/Pain Pain Assessment: 0-10 Pain Score: 4  Pain Location: L hip Pain Descriptors / Indicators: Sore;Guarding Pain Intervention(s): Monitored during session;Repositioned;Ice applied    Home Living Family/patient expects to be discharged to:: Private residence Living Arrangements: Children Available Help at Discharge: Family;Available 24 hours/day Type of Home: House Home Access: Stairs to enter Entrance Stairs-Rails: None Entrance Stairs-Number of Steps: 1 Home Layout: One level Home Equipment: Cane - single point      Prior Function Level of Independence: Independent with assistive device(s)         Comments: pt stated she has been ambulating with SPC for ~3 months secondary to pain. She was independent with all ADLs prior to admission.     Hand Dominance        Extremity/Trunk Assessment   Upper Extremity Assessment: Overall WFL for tasks assessed           Lower Extremity Assessment: RLE deficits/detail RLE Deficits /  Details: Pt with decreased strength and ROM limitations secondary to post-op. Sensation grossly intact.       Communication   Communication: No difficulties  Cognition Arousal/Alertness: Awake/alert Behavior During Therapy: WFL for tasks assessed/performed Overall Cognitive Status: Within Functional  Limits for tasks assessed                      General Comments      Exercises Total Joint Exercises Ankle Circles/Pumps: AROM;Both;10 reps;Seated Quad Sets: AROM;Strengthening;Right;10 reps;Seated Hip ABduction/ADduction: AAROM;Right;10 reps;Seated   Assessment/Plan    PT Assessment Patient needs continued PT services  PT Problem List Decreased strength;Decreased range of motion;Decreased activity tolerance;Decreased balance;Decreased mobility;Decreased coordination;Decreased knowledge of use of DME;Pain          PT Treatment Interventions DME instruction;Gait training;Stair training;Functional mobility training;Therapeutic activities;Therapeutic exercise;Balance training;Neuromuscular re-education;Patient/family education    PT Goals (Current goals can be found in the Care Plan section)  Acute Rehab PT Goals Patient Stated Goal: return home PT Goal Formulation: With patient/family Time For Goal Achievement: 07/30/16 Potential to Achieve Goals: Good    Frequency 7X/week   Barriers to discharge        Co-evaluation               End of Session Equipment Utilized During Treatment: Gait belt Activity Tolerance: Patient limited by fatigue;Patient limited by pain Patient left: in chair;with call bell/phone within reach;with family/visitor present Nurse Communication: Mobility status         Time: [id number] PT Time Calculation (min) (ACUTE ONLY): 32 min   Charges:   PT Evaluation $PT Eval Moderate Complexity: 1 Procedure PT Treatments $Gait Training: 8-22 mins   PT G CodesClearnce Sorrel Kyros Salzwedel 07/23/2016, 6:26 PM Sherie Don, [place], DPT [phone]

## 2016-07-23 NOTE — Discharge Instructions (Signed)
INSTRUCTIONS AFTER JOINT REPLACEMENT   o Remove items at home which could result in a fall. This includes throw rugs or furniture in walking pathways o ICE to the affected joint every three hours while awake for 30 minutes at a time, for at least the first 3-5 days, and then as needed for pain and swelling.  Continue to use ice for pain and swelling. You may notice swelling that will progress down to the foot and ankle.  This is normal after surgery.  Elevate your leg when you are not up walking on it.   o Continue to use the breathing machine you got in the hospital (incentive spirometer) which will help keep your temperature down.  It is common for your temperature to cycle up and down following surgery, especially at night when you are not up moving around and exerting yourself.  The breathing machine keeps your lungs expanded and your temperature down.   DIET:  As you were doing prior to hospitalization, we recommend a well-balanced diet.  DRESSING / WOUND CARE / SHOWERING  Keep the surgical dressing until follow up.  The dressing is water proof, so you can shower without any extra covering.  IF THE DRESSING FALLS OFF or the wound gets wet inside, change the dressing with sterile gauze.  Please use good hand washing techniques before changing the dressing.  Do not use any lotions or creams on the incision until instructed by your surgeon.    ACTIVITY  o Increase activity slowly as tolerated, but follow the weight bearing instructions below.   o No driving for 6 weeks or until further direction given by your physician.  You cannot drive while taking narcotics.  o No lifting or carrying greater than 10 lbs. until further directed by your surgeon. o Avoid periods of inactivity such as sitting longer than an hour when not asleep. This helps prevent blood clots.  o You may return to work once you are authorized by your doctor.     WEIGHT BEARING   Partial weight bearing with assist device as  directed.  50% to the right leg   EXERCISES  Results after joint replacement surgery are often greatly improved when you follow the exercise, range of motion and muscle strengthening exercises prescribed by your doctor. Safety measures are also important to protect the joint from further injury. Any time any of these exercises cause you to have increased pain or swelling, decrease what you are doing until you are comfortable again and then slowly increase them. If you have problems or questions, call your caregiver or physical therapist for advice.   Rehabilitation is important following a joint replacement. After just a few days of immobilization, the muscles of the leg can become weakened and shrink (atrophy).  These exercises are designed to build up the tone and strength of the thigh and leg muscles and to improve motion. Often times heat used for twenty to thirty minutes before working out will loosen up your tissues and help with improving the range of motion but do not use heat for the first two weeks following surgery (sometimes heat can increase post-operative swelling).   These exercises can be done on a training (exercise) mat, on the floor, on a table or on a bed. Use whatever works the best and is most comfortable for you.    Use music or television while you are exercising so that the exercises are a pleasant break in your day. This will make your life  better with the exercises acting as a break in your routine that you can look forward to.   Perform all exercises about fifteen times, three times per day or as directed.  You should exercise both the operative leg and the other leg as well.  Exercises include:    Quad Sets - Tighten up the muscle on the front of the thigh (Quad) and hold for 5-10 seconds.    Straight Leg Raises - With your knee straight (if you were given a brace, keep it on), lift the leg to 60 degrees, hold for 3 seconds, and slowly lower the leg.  Perform this  exercise against resistance later as your leg gets stronger.   Leg Slides: Lying on your back, slowly slide your foot toward your buttocks, bending your knee up off the floor (only go as far as is comfortable). Then slowly slide your foot back down until your leg is flat on the floor again.   Angel Wings: Lying on your back spread your legs to the side as far apart as you can without causing discomfort.   Hamstring Strength:  Lying on your back, push your heel against the floor with your leg straight by tightening up the muscles of your buttocks.  Repeat, but this time bend your knee to a comfortable angle, and push your heel against the floor.  You may put a pillow under the heel to make it more comfortable if necessary.   A rehabilitation program following joint replacement surgery can speed recovery and prevent re-injury in the future due to weakened muscles. Contact your doctor or a physical therapist for more information on knee rehabilitation.    CONSTIPATION  Constipation is defined medically as fewer than three stools per week and severe constipation as less than one stool per week.  Even if you have a regular bowel pattern at home, your normal regimen is likely to be disrupted due to multiple reasons following surgery.  Combination of anesthesia, postoperative narcotics, change in appetite and fluid intake all can affect your bowels.   YOU MUST use at least one of the following options; they are listed in order of increasing strength to get the job done.  They are all available over the counter, and you may need to use some, POSSIBLY even all of these options:    Drink plenty of fluids (prune juice may be helpful) and high fiber foods Colace 100 mg by mouth twice a day  Senokot for constipation as directed and as needed Dulcolax (bisacodyl), take with full glass of water  Miralax (polyethylene glycol) once or twice a day as needed.  If you have tried all these things and are unable to  have a bowel movement in the first 3-4 days after surgery call either your surgeon or your primary doctor.    If you experience loose stools or diarrhea, hold the medications until you stool forms back up.  If your symptoms do not get better within 1 week or if they get worse, check with your doctor.  If you experience "the worst abdominal pain ever" or develop nausea or vomiting, please contact the office immediately for further recommendations for treatment.   ITCHING:  If you experience itching with your medications, try taking only a single pain pill, or even half a pain pill at a time.  You can also use Benadryl over the counter for itching or also to help with sleep.   TED HOSE STOCKINGS:  Use stockings on both legs  until for at least 2 weeks or as directed by physician office. They may be removed at night for sleeping.  MEDICATIONS:  See your medication summary on the After Visit Summary that nursing will review with you.  You may have some home medications which will be placed on hold until you complete the course of blood thinner medication.  It is important for you to complete the blood thinner medication as prescribed.  PRECAUTIONS:  If you experience chest pain or shortness of breath - call 911 immediately for transfer to the hospital emergency department.   If you develop a fever greater that 101 F, purulent drainage from wound, increased redness or drainage from wound, foul odor from the wound/dressing, or calf pain - CONTACT YOUR SURGEON.                                                   FOLLOW-UP APPOINTMENTS:  If you do not already have a post-op appointment, please call the office for an appointment to be seen by your surgeon.  Guidelines for how soon to be seen are listed in your After Visit Summary, but are typically between 1-4 weeks after surgery.  OTHER INSTRUCTIONS:   Knee Replacement:  Do not place pillow under knee, focus on keeping the knee straight while resting.  CPM instructions: 0-90 degrees, 2 hours in the morning, 2 hours in the afternoon, and 2 hours in the evening. Place foam block, curve side up under heel at all times except when in CPM or when walking.  DO NOT modify, tear, cut, or change the foam block in any way.  MAKE SURE YOU:   Understand these instructions.   Get help right away if you are not doing well or get worse.    Thank you for letting us be a part of your medical care team.  It is a privilege we respect greatly.  We hope these instructions will help you stay on track for a fast and full recovery!

## 2016-07-23 NOTE — Op Note (Signed)
OPERATIVE REPORT    DATE OF PROCEDURE:  07/23/2016       PREOPERATIVE DIAGNOSIS:  PRIMARY OSTEOARTHRITIS OF RIGHT HIP M16.11                                                          POSTOPERATIVE DIAGNOSIS:  PRIMARY OSTEOARTHRITIS OF RIGHT HIP M16.11, intraoperative acetabular fracture medial wall anterior rim                                                          PROCEDURE: Anterior R total hip arthroplasty using a 50 mm DePuy multihole Gryption Cup, 2 dome screws, one medial screw, and one screw in the ischium, 0-degree polyethylene liner, a +1.5 74mm ceramic head, a 3 Depuy Triloc stem. Acetabular fracture was fixed with a multi hole cup in the 4 screws.   SURGEON: Frederik Pear J    ASSISTANT:   Kerry Hough. [employer]  (present throughout entire procedure and necessary for timely completion of the procedure)   ANESTHESIA: Spinal BLOOD LOSS: 400 mL FLUID REPLACEMENT: 1800 mL crystalloid Antibiotic: 2 g Ancef Tranexamic Acid: 2 g topical COMPLICATIONS: none    INDICATIONS FOR PROCEDURE: A 82 y.o. year-old With  PRIMARY OSTEOARTHRITIS OF RIGHT HIP M16.11   for 3 years, x-rays show bone-on-bone arthritic changes, and osteophytes. Despite conservative measures with observation, anti-inflammatory medicine, narcotics, use of a cane, has severe unremitting pain and can ambulate only a few blocks before resting. Comorbidities include atrial fibrillation, controlled congestive heart failure, osteoporosis. Patient desires elective right total hip arthroplasty to decrease pain and increase function. The risks, benefits, and alternatives were discussed at length including but not limited to the risks of infection, bleeding, nerve injury, stiffness, blood clots, the need for revision surgery, cardiopulmonary complications, among others, and they were willing to proceed. Questions answered     PROCEDURE IN DETAIL: The patient was identified by armband,  received preoperative IV antibiotics in the  holding area at [facility], taken to the operating room , appropriate anesthetic monitors  were attached and  anesthesia was induced with the patienton the gurney. The HANA boots were applied to the feet and he was then transferred to the HANA table with a peroneal post and support underneath the non-operative le, which was locked in 5 lb traction. Theoperative lower extremity was then prepped and draped in the usual sterile fashion from just above the iliac crest to the knee. And a timeout procedure was performed. We then made a 10 cm incision along the interval at the leading edge of the tensor fascia lata of starting at 2 cm lateral to and 2 cm distal to the ASIS. Small bleeders in the skin and subcutaneous tissue identified and cauterized we dissected down to the fascia and made an incision in the fascia allowing [place] to elevate the fascia of the tensor muscle and exploited the interval between the rectus and the tensor fascia lata. A Hohmann retractor was then placed along the superior neck of the femur and a Cobra retractor along the inferior neck of the femur we teed the capsule starting out at the  superior anterior aspect of the acetabulum going distally and made the T along the neck both leaflets of the T were tagged with #2 Ethibond suture. Cobra retractors were then placed along the inferior and superior neck allowing [place] to perform a standard neck cut and removed the femoral head with a power corkscrew. We then placed a right angle Hohmann retractor along the anterior aspect of the acetabulum a spiked Cobra in the cotyloid notch and posteriorly a Muelller retractor. We then sequentially reamed up to a 49 mm basket reamer obtaining good coverage in all quadrants, verified by C-arm imaging. Under C-arm control with and hammered into place a 50 mm Pinnacle cup in 45 of abduction and 15 of anteversion. The cup seated nicely, but on intraoperative imaging a fracture was noted in the medial wall of  the acetabulum on C-arm imaging. In addition cup stability was less than expected. I remove the acetabular shell examine acetabulum and found a crack in the medial wall that extended anteriorly. At this point we elected to place a multi hole DePuy Gryption cup which was inserted in a similar fashion and noted to be stable. Because of the crack in the medial wall of elected to place 2 dome screws, a medial screw and a posterior medial screw into the ischium effectively plating the medial wall to anterior wall fracture.. We then placeda 0 32 mm polyethylene liner. The foot was then externally rotated to 110, the HANA elevator was placed around the flare of the greater trochanter and the limb was extended and abducted delivering the proximal femur up into the wound. A medium Hohmann retractor was placed over the greater trochanter and a Mueller retractor along the posterior femoral neck completing the exposure. We then performed releases superiorly and and inferiorly of the capsule going back to the pirformis fossa superiorly and to the lesser trochanter inferiorly. We then entered the proximal femur with the box cutting offset chisel followed by, a canal sounder, the chili pepper and broaching up to a 3 broach. This seated nicely and we reamed the calcar. A trial reduction was performed with a 1.5 mm 32 mm head.The limb lengths were excellent the hip was stable in 90 of external rotation. At this point the trial components removed and we hammered into place a # 3 Tri-Lock stem with Gryption coating. This was a std offset stem and a + 1.5 32 mm ceramic ball was then hammered into place the hip was reduced and final C-arm images obtained. The wound was thoroughly irrigated with normal saline solution. We repaired the ant capsule and the tensor fascia lot a with running 0 vicryl suture. the subcutaneous tissue was closed with 2-0 and 3-0 Vicryl suture followed by an Aquacil dressing. At this point the patient was  awaken and transferred to hospital gurney without difficulty. The subcutaneous tissue with 0 and 2-0 undyed Vicryl suture and the skin with running  3-0 vicryl subcuticular suture. Aquacil dressing was applied. The patient was then unclamped, rolled supine, awaken extubated and taken to recovery room without difficulty in stable condition.   Kimari Coudriet J 07/23/2016, 11:42 AM

## 2016-07-23 NOTE — Anesthesia Procedure Notes (Signed)
Spinal  Patient location during procedure: OR Start time: 07/23/2016 9:30 AM End time: 07/23/2016 9:54 AM Staffing Anesthesiologist: Reginal Lutes Performed: anesthesiologist  Preanesthetic Checklist Completed: patient identified, site marked, surgical consent, pre-op evaluation, timeout performed, IV checked, risks and benefits discussed and monitors and equipment checked Spinal Block Patient position: sitting Prep: ChloraPrep Patient monitoring: cardiac monitor, continuous pulse ox, blood pressure and heart rate Approach: midline Location: L4-5 Injection technique: single-shot Needle Needle type: Pencil-Tip  Needle gauge: 25 G Assessment Sensory level: T6

## 2016-07-23 NOTE — Progress Notes (Signed)
ekg reviewed by guidetti MDA/ no acute changes and pt states the sensation of discomfort in chest "is gone".

## 2016-07-23 NOTE — Progress Notes (Signed)
ANTICOAGULATION CONSULT NOTE - Initial Consult  Pharmacy Consult for Coumadin Indication: VTE prophylaxis  Allergies  Allergen Reactions  . Nexium [Esomeprazole Magnesium] Other (See Comments)    Chest heaviness.  . Sulfonamide Derivatives Other (See Comments)    REACTION: rash- burning on face  . Nitrofurantoin Other (See Comments)    Pulmonary fibrosis     Patient Measurements: Height: 5\' 3"  (160 cm) Weight: 151 lb 14 oz (68.9 kg) IBW/kg (Calculated) : 52.4 Heparin Dosing Weight:   Vital Signs: Temp: 97.9 F (36.6 C) (10/06 1500) Temp Source: Oral (10/06 1500) BP: 130/76 (10/06 1500) Pulse Rate: 80 (10/06 1444)  Labs:  Recent Labs  07/23/16 0807  APTT 41*  LABPROT 15.0  INR 1.18    Estimated Creatinine Clearance: 39.7 mL/min (by C-G formula based on SCr of 1 mg/dL).   Medical History: Past Medical History:  Diagnosis Date  . Arthritis    "everywhere"  . Atrial fibrillation ([place])    s/p 2x ablation on chronic coumadin, previously on amiodarone but had pulmonary SE.  Marland Kitchen CHF (congestive heart failure) ([place])   . COPD (chronic obstructive pulmonary disease) [facility])    "pulmonologist told me last week my lungs are good" (07/10/2015)  . Diverticulitis   . Dysrhythmia   . Fracture of lateral malleolus 11/17/2013  . Fracture, fibula 11/26/2013  . Fracture, sternum closed 11/26/13  . GERD (gastroesophageal reflux disease)   . Headache   . Herpes zoster   . Osteoporosis   . Shortness of breath dyspnea     Medications:  Prescriptions Prior to Admission  Medication Sig Dispense Refill Last Dose  . acetaminophen (TYLENOL) 650 MG CR tablet Take 1,300 mg by mouth daily as needed for pain.   Past Month at Unknown time  . diltiazem (CARDIZEM CD) 120 MG 24 hr capsule Take 120 mg by mouth daily.   07/22/2016 at Unknown time  . glucosamine-chondroitin 500-400 MG tablet Take 1 tablet by mouth daily.    07/22/2016 at Unknown time  . metoprolol succinate (TOPROL-XL) 25 MG 24 hr  tablet Take 25 mg by mouth every evening.  3 07/22/2016 at Unknown time  . Multiple Vitamins-Minerals (PRESERVISION AREDS 2 PO) Take 1 tablet by mouth 2 (two) times daily.   07/22/2016 at Unknown time  . PREVIDENT 5000 SENSITIVE 1.1-5 % PSTE Take 1 application by mouth at bedtime.   6 Taking  . warfarin (COUMADIN) 5 MG tablet TAKE 1 TABLET BY MOUTH EVERY DAY (Patient taking differently: Take 5mg s daily except on saturdays take 2.5mg ) 90 tablet 2 07/18/2016   Scheduled:  . [START ON 07/24/2016] dexamethasone  10 mg Intravenous Once  . diltiazem  120 mg Oral Daily  . docusate sodium  100 mg Oral BID  . metoprolol succinate  25 mg Oral QPM    Assessment: 82 y.o female s/p right THR.  PMH as noted above includes h/o Afib which the patient is on chronic coumadin PTA.  Couamdin dose PTA was 5 mg daily except 2.5 mg every Saturday, last taken PTA on 07/18/16. Pharmacy consulted to dose/monitor Coumadin for VTE prophylaxis and for h/o Afib.  Preop  INR 1.18 on 07/23/16 (coumadin held since 07/18/16 PTA)  Goal of Therapy:  INR 2-3 Monitor platelets by anticoagulation protocol: Yes   Plan:  Coumadin 5mg  po today x1 Daily PT/INR Monitor for s/sx of bleeding Coumadin education- book/video ordered.   Thank you for allowing pharmacy to be part of this patients care team. Nicole Cella, [place] Clinical Pharmacist  Pager: [phone] 07/23/2016,4:07 PM

## 2016-07-23 NOTE — Transfer of Care (Signed)
Immediate Anesthesia Transfer of Care Note  Patient: Terri Small  Procedure(s) Performed: Procedure(s): RIGHT TOTAL HIP ARTHROPLASTY ANTERIOR APPROACH (Right)  Patient Location: PACU  Anesthesia Type:MAC and Spinal  Level of Consciousness: awake, alert  and oriented  Airway & Oxygen Therapy: Patient Spontanous Breathing and Patient connected to face mask oxygen  Post-op Assessment: Report given to RN and Post -op Vital signs reviewed and stable  Post vital signs: Reviewed and stable  Last Vitals:  Vitals:   07/23/16 0818  BP: (!) 145/87  Pulse: 71  Resp: 20  Temp: 36.6 C    Last Pain:  Vitals:   07/23/16 0818  TempSrc: Oral         Complications: No apparent anesthesia complications

## 2016-07-23 NOTE — Anesthesia Postprocedure Evaluation (Signed)
Anesthesia Post Note  Patient: Terri Small  Procedure(s) Performed: Procedure(s) (LRB): RIGHT TOTAL HIP ARTHROPLASTY ANTERIOR APPROACH (Right)  Patient location during evaluation: PACU Anesthesia Type: Spinal Level of consciousness: oriented and awake and alert Pain management: pain level controlled Vital Signs Assessment: post-procedure vital signs reviewed and stable Respiratory status: spontaneous breathing, respiratory function stable and patient connected to nasal cannula oxygen Cardiovascular status: blood pressure returned to baseline and stable Postop Assessment: no headache and no backache Anesthetic complications: no    Last Vitals:  Vitals:   07/23/16 1444 07/23/16 1500  BP: 122/75 130/76  Pulse: 80   Resp: 17 18  Temp:  36.6 C    Last Pain:  Vitals:   07/23/16 1500  TempSrc: Oral  PainSc: 0-No pain                 Reginal Lutes

## 2016-07-24 LAB — CBC
HCT: 31.7 % — ABNORMAL LOW (ref 36.0–46.0)
HEMOGLOBIN: 10.3 g/dL — AB (ref 12.0–15.0)
MCH: 32.1 pg (ref 26.0–34.0)
MCHC: 32.5 g/dL (ref 30.0–36.0)
MCV: 98.8 fL (ref 78.0–100.0)
Platelets: 154 10*3/uL (ref 150–400)
RBC: 3.21 MIL/uL — AB (ref 3.87–5.11)
RDW: 13.5 % (ref 11.5–15.5)
WBC: 6.8 10*3/uL (ref 4.0–10.5)

## 2016-07-24 LAB — PROTIME-INR
INR: 1.36
PROTHROMBIN TIME: 16.9 s — AB (ref 11.4–15.2)

## 2016-07-24 MED ORDER — WARFARIN SODIUM 7.5 MG PO TABS
7.5000 mg | ORAL_TABLET | Freq: Once | ORAL | Status: AC
Start: 2016-07-24 — End: 2016-07-24
  Administered 2016-07-24: 7.5 mg via ORAL
  Filled 2016-07-24: qty 1

## 2016-07-24 NOTE — Progress Notes (Signed)
Physical Therapy Treatment Patient Details Name: Terri Small MRN: [id number] DOB: 1931/03/04 Today's Date: 07/24/2016    History of Present Illness Pt is an 82 y/o female s/p R THA. PMH including but not limited to CHF, COPD, and L TSA in 2016.    PT Comments    Pt presented supine in bed with HOB elevated, awake and willing to participate in therapy session. Pt making good progress; however, limited secondary to fatigue and required frequent standing rest breaks during ambulation. Pt would continue to benefit from skilled physical therapy services at this time while admitted and after d/c to address her limitations in order to improve her overall safety and independence with functional mobility.   Follow Up Recommendations  Home health PT;Supervision for mobility/OOB     Equipment Recommendations  Rolling walker with 5" wheels;3in1 (PT)    Recommendations for Other Services       Precautions / Restrictions Precautions Precautions: Fall Restrictions Weight Bearing Restrictions: Yes RLE Weight Bearing: Partial weight bearing RLE Partial Weight Bearing Percentage or Pounds: 50    Mobility  Bed Mobility Overal bed mobility: Needs Assistance Bed Mobility: Supine to Sit     Supine to sit: Min assist;HOB elevated     General bed mobility comments: pt required increased time and min A for R LE movement  Transfers Overall transfer level: Needs assistance Equipment used: Rolling walker (2 wheeled) Transfers: Sit to/from Stand Sit to Stand: Min guard         General transfer comment: pt required increased time, VC'ing for bilateral hand placement and min guard for safety  Ambulation/Gait Ambulation/Gait assistance: Min guard Ambulation Distance (Feet): 25 Feet Assistive device: Rolling walker (2 wheeled) Gait Pattern/deviations: Step-through pattern;Step-to pattern;Decreased step length - right;Decreased weight shift to right;Decreased stride length;Trunk flexed Gait  velocity: decreased Gait velocity interpretation: Below normal speed for age/gender General Gait Details: VC'ing to utilize bilateral UEs on RW when standing on R LE to step with L LE as to maintain PWB 50%; VC'ing for increased step length with R LE and maintaining safe distance from RW. pt required frequent standing rest breaks secondary to fatigue    Stairs            Wheelchair Mobility    Modified Rankin (Stroke Patients Only)       Balance Overall balance assessment: Needs assistance Sitting-balance support: Feet supported;No upper extremity supported Sitting balance-Leahy Scale: Fair     Standing balance support: During functional activity;Bilateral upper extremity supported Standing balance-Leahy Scale: Poor Standing balance comment: pt reliant on bilateral UEs on RW                    Cognition Arousal/Alertness: Awake/alert Behavior During Therapy: WFL for tasks assessed/performed Overall Cognitive Status: Within Functional Limits for tasks assessed                      Exercises Total Joint Exercises Ankle Circles/Pumps: AROM;Both;10 reps;Seated Quad Sets: AROM;Strengthening;Right;10 reps;Seated Gluteal Sets: AROM;Both;10 reps;Seated Heel Slides: AROM;Strengthening;Right;10 reps;Seated    General Comments        Pertinent Vitals/Pain Pain Assessment: Faces Faces Pain Scale: Hurts little more Pain Location: L hip Pain Descriptors / Indicators: Sore;Guarding Pain Intervention(s): Monitored during session;Repositioned    Home Living                      Prior Function            PT Goals (current  goals can now be found in the care plan section) Acute Rehab PT Goals Patient Stated Goal: return home PT Goal Formulation: With patient/family Time For Goal Achievement: 07/30/16 Potential to Achieve Goals: Good Progress towards PT goals: Progressing toward goals    Frequency    7X/week      PT Plan Current plan  remains appropriate    Co-evaluation             End of Session Equipment Utilized During Treatment: Gait belt Activity Tolerance: Patient limited by fatigue Patient left: in chair;with call bell/phone within reach     Time: 0915-0940 PT Time Calculation (min) (ACUTE ONLY): 25 min  Charges:  $Gait Training: 8-22 mins $Therapeutic Exercise: 8-22 mins                    G CodesClearnce Small Terri Small 08/10/16, 10:20 AM Terri Small, Terri Small, DPT [phone]

## 2016-07-24 NOTE — Progress Notes (Signed)
ANTICOAGULATION CONSULT NOTE - Follow Up  Pharmacy Consult for Coumadin Indication: VTE prophylaxis  Allergies  Allergen Reactions  . Nexium [Esomeprazole Magnesium] Other (See Comments)    Chest heaviness.  . Sulfonamide Derivatives Other (See Comments)    REACTION: rash- burning on face  . Nitrofurantoin Other (See Comments)    Pulmonary fibrosis     Patient Measurements: Height: 5\' 3"  (160 cm) Weight: 151 lb 14 oz (68.9 kg) IBW/kg (Calculated) : 52.4  Vital Signs: Temp: 98.2 F (36.8 C) (10/07 0546) Temp Source: Oral (10/07 0546) BP: 98/43 (10/07 0546) Pulse Rate: 81 (10/07 0546)  Labs:  Recent Labs  07/23/16 0807 07/24/16 0400  HGB  --  10.3*  HCT  --  31.7*  PLT  --  154  APTT 41*  --   LABPROT 15.0 16.9*  INR 1.18 1.36    Estimated Creatinine Clearance: 39.7 mL/min (by C-G formula based on SCr of 1 mg/dL).   Medical History: Past Medical History:  Diagnosis Date  . Arthritis    "everywhere"  . Atrial fibrillation ([place])    s/p 2x ablation on chronic coumadin, previously on amiodarone but had pulmonary SE.  Marland Kitchen CHF (congestive heart failure) ([place])   . COPD (chronic obstructive pulmonary disease) [facility])    "pulmonologist told me last week my lungs are good" (07/10/2015)  . Diverticulitis   . Dysrhythmia   . Fracture of lateral malleolus 11/17/2013  . Fracture, fibula 11/26/2013  . Fracture, sternum closed 11/26/13  . GERD (gastroesophageal reflux disease)   . Headache   . Herpes zoster   . Osteoporosis   . Shortness of breath dyspnea     Medications:  Prescriptions Prior to Admission  Medication Sig Dispense Refill Last Dose  . acetaminophen (TYLENOL) 650 MG CR tablet Take 1,300 mg by mouth daily as needed for pain.   Past Month at Unknown time  . diltiazem (CARDIZEM CD) 120 MG 24 hr capsule Take 120 mg by mouth daily.   07/22/2016 at Unknown time  . glucosamine-chondroitin 500-400 MG tablet Take 1 tablet by mouth daily.    07/22/2016 at Unknown time  .  metoprolol succinate (TOPROL-XL) 25 MG 24 hr tablet Take 25 mg by mouth every evening.  3 07/22/2016 at Unknown time  . Multiple Vitamins-Minerals (PRESERVISION AREDS 2 PO) Take 1 tablet by mouth 2 (two) times daily.   07/22/2016 at Unknown time  . PREVIDENT 5000 SENSITIVE 1.1-5 % PSTE Take 1 application by mouth at bedtime.   6 Taking  . warfarin (COUMADIN) 5 MG tablet TAKE 1 TABLET BY MOUTH EVERY DAY (Patient taking differently: Take 5mg s daily except on saturdays take 2.5mg ) 90 tablet 2 07/18/2016   Scheduled:  . dexamethasone  10 mg Intravenous Once  . diltiazem  120 mg Oral Daily  . docusate sodium  100 mg Oral BID  . metoprolol succinate  25 mg Oral QPM  . Warfarin - Pharmacist Dosing Inpatient   Does not apply q1800    Assessment: 82 yo f s/p right THR. Pt on coumadin PTA for h/o afib. Preop INR subtherapeutic at 1.18 (last dose of coumadin 10/1). INR 1.36 this AM after 5 mg dose last night.  CBC ok. No issues noted.   PTA coumadin dose - 5 mg daily except 2.5 mg every Saturday  Goal of Therapy:  INR 2-3 Monitor platelets by anticoagulation protocol: Yes   Plan:  - Coumdain 7.5 mg PO x 1 tonight - Daily INR - Monitor for s/sx of  bleeding  Cassie L. Nicole Kindred, PharmD Clinical Pharmacist Pager: [phone] 07/24/2016 9:02 AM

## 2016-07-24 NOTE — Plan of Care (Signed)
Problem: Pain Managment: Goal: General experience of comfort will improve Outcome: Progressing Medicated once for pain with full relief  Problem: Physical Regulation: Goal: Will remain free from infection Outcome: Progressing No S/S of infection noted  Problem: Activity: Goal: Risk for activity intolerance will decrease Outcome: Progressing OOB to [facility] with minimal assistance and tolerates well  Problem: Bowel/Gastric: Goal: Will not experience complications related to bowel motility Outcome: Progressing No bowel or gastric issues noted

## 2016-07-24 NOTE — Progress Notes (Signed)
Physical Therapy Treatment Patient Details Name: Terri Small MRN: [id number] DOB: [birth year] Today's Date: 07/24/2016    History of Present Illness Pt is an 82 y/o female s/p R THA. PMH including but not limited to CHF, COPD, and L TSA in 2016.    PT Comments    Pt presented standing in bathroom with OT, finishing up session by washing hands at sink. Pt making excellent progress towards achieving her functional goals and successfully completed stair training during this session. Pt would like to practice stair training again tomorrow morning prior to d/c home. Pt would continue to benefit from skilled physical therapy services at this time while admitted and after d/c to address her limitations in order to improve her overall safety and independence with functional mobility.   Follow Up Recommendations  Home health PT;Supervision for mobility/OOB     Equipment Recommendations  3in1 (PT) (pt brought in her RW from home)    Recommendations for Other Services       Precautions / Restrictions Precautions Precautions: Fall Precaution Comments: Anterior hip Restrictions Weight Bearing Restrictions: Yes RLE Weight Bearing: Partial weight bearing RLE Partial Weight Bearing Percentage or Pounds: 50    Mobility  Bed Mobility               General bed mobility comments: Pt OOB in chair upon arrival.  Transfers Overall transfer level: Needs assistance Equipment used: Rolling walker (2 wheeled) Transfers: Sit to/from Stand Sit to Stand: Supervision         General transfer comment: Supervision for safety. Good hand placement and technique. Increased time required.  Ambulation/Gait Ambulation/Gait assistance: Supervision Ambulation Distance (Feet): 75 Feet Assistive device: Rolling walker (2 wheeled) Gait Pattern/deviations: Step-to pattern;Decreased step length - left;Decreased stance time - right;Decreased weight shift to right Gait velocity: decreased Gait velocity  interpretation: Below normal speed for age/gender General Gait Details: pt demonstrated improved endurance during ambulation this session. Good technique with RW.   Stairs Stairs: Yes Stairs assistance: Min guard Stair Management: No rails;Backwards;With walker Number of Stairs: 1 (x2 trials) General stair comments: pt ascended with L LE leading and descended with R LE leading  Wheelchair Mobility    Modified Rankin (Stroke Patients Only)       Balance Overall balance assessment: Needs assistance Sitting-balance support: Feet supported;No upper extremity supported Sitting balance-Leahy Scale: Good     Standing balance support: During functional activity;No upper extremity supported Standing balance-Leahy Scale: Fair Standing balance comment: Pt able to stand at sink and wash hands without UE support                    Cognition Arousal/Alertness: Awake/alert Behavior During Therapy: WFL for tasks assessed/performed Overall Cognitive Status: Within Functional Limits for tasks assessed                      Exercises      General Comments        Pertinent Vitals/Pain Pain Assessment: Faces Faces Pain Scale: Hurts little more Pain Location: R thigh Pain Descriptors / Indicators: Sore;Guarding Pain Intervention(s): Monitored during session;Repositioned;Ice applied    Home Living Family/patient expects to be discharged to:: Private residence Living Arrangements: Children Available Help at Discharge: Family;Available 24 hours/day Type of Home: House Home Access: Stairs to enter Entrance Stairs-Rails: None Home Layout: One level Home Equipment: Cane - single point;Shower seat - built in;Grab bars - toilet;Grab bars - tub/shower      Prior Function Level of Independence:  Independent with assistive device(s)      Comments: pt stated she has been ambulating with SPC for ~3 months secondary to pain. She was independent with all ADLs prior to  admission.   PT Goals (current goals can now be found in the care plan section) Acute Rehab PT Goals Patient Stated Goal: return home PT Goal Formulation: With patient/family Time For Goal Achievement: 07/30/16 Potential to Achieve Goals: Good Progress towards PT goals: Progressing toward goals    Frequency    7X/week      PT Plan Current plan remains appropriate    Co-evaluation             End of Session Equipment Utilized During Treatment: Gait belt Activity Tolerance: Patient tolerated treatment well Patient left: in chair;with call bell/phone within reach     Time: 1635-1705 PT Time Calculation (min) (ACUTE ONLY): 30 min  Charges:  $Gait Training: 23-37 mins                    G CodesClearnce Sorrel Bahja Bence 07/29/16, 5:35 PM Sherie Don, [place], DPT [phone]

## 2016-07-24 NOTE — Evaluation (Signed)
Occupational Therapy Evaluation Patient Details Name: Terri Small MRN: [id number] DOB: 05/19/1931 Today's Date: 07/24/2016    History of Present Illness Pt is an 82 y/o female s/p R THA. PMH including but not limited to CHF, COPD, and L TSA in 2016.   Clinical Impression   Pt reports she was independent with ADL PTA. Currently pt overall supervision for ADL and functional mobility with the exception of mod assist for LB ADL. Pt able to maintain PWB 50% on RLE throughout functional activities during session. Pt planning to d/c home with 24/7 supervision from family. Pt would benefit from continued skilled OT to address established goals.    Follow Up Recommendations  No OT follow up;Supervision/Assistance - 24 hour (initially)    Equipment Recommendations  None recommended by OT    Recommendations for Other Services       Precautions / Restrictions Precautions Precautions: Fall Precaution Comments: Anterior hip Restrictions Weight Bearing Restrictions: Yes RLE Weight Bearing: Partial weight bearing RLE Partial Weight Bearing Percentage or Pounds: 50      Mobility Bed Mobility               General bed mobility comments: Pt OOB in chair upon arrival.  Transfers Overall transfer level: Needs assistance Equipment used: Rolling walker (2 wheeled) Transfers: Sit to/from Stand Sit to Stand: Supervision         General transfer comment: Supervision for safety. Good hand placement and technique. Increased time required.    Balance Overall balance assessment: Needs assistance Sitting-balance support: Feet supported;No upper extremity supported Sitting balance-Leahy Scale: Good     Standing balance support: No upper extremity supported;During functional activity Standing balance-Leahy Scale: Fair Standing balance comment: Pt able to stand at sink and wash hands without UE support                            ADL Overall ADL's : Needs  assistance/impaired Eating/Feeding: Set up;Sitting   Grooming: Supervision/safety;Standing;Wash/dry hands   Upper Body Bathing: Set up;Supervision/ safety;Sitting   Lower Body Bathing: Moderate assistance;Sit to/from stand   Upper Body Dressing : Set up;Supervision/safety;Sitting   Lower Body Dressing: Moderate assistance;Sit to/from stand Lower Body Dressing Details (indicate cue type and reason): Assist to adjust socks. Pt reports daughter can assist as needed. Toilet Transfer: Supervision/safety;Ambulation;BSC;RW Toilet Transfer Details (indicate cue type and reason): Pt able to maintain PWB 50% throughout Toileting- Clothing Manipulation and Hygiene: Supervision/safety;Sit to/from stand       Functional mobility during ADLs: Supervision/safety;Rolling walker General ADL Comments: Educated pt on home safety and fall prevention techniques. Pt able to maintain PWB status throughout functional activities during session.     Vision Vision Assessment?: No apparent visual deficits   Perception     Praxis      Pertinent Vitals/Pain Pain Assessment: Faces Faces Pain Scale: Hurts little more Pain Location: L hip Pain Descriptors / Indicators: Grimacing;Sore Pain Intervention(s): Monitored during session;Repositioned     Hand Dominance Right   Extremity/Trunk Assessment Upper Extremity Assessment Upper Extremity Assessment: Overall WFL for tasks assessed   Lower Extremity Assessment Lower Extremity Assessment: Defer to PT evaluation   Cervical / Trunk Assessment Cervical / Trunk Assessment: Kyphotic   Communication Communication Communication: No difficulties   Cognition Arousal/Alertness: Awake/alert Behavior During Therapy: WFL for tasks assessed/performed Overall Cognitive Status: Within Functional Limits for tasks assessed  General Comments       Exercises       Shoulder Instructions      Home Living Family/patient expects to  be discharged to:: Private residence Living Arrangements: Children Available Help at Discharge: Family;Available 24 hours/day Type of Home: House Home Access: Stairs to enter [employer] of Steps: 1 Entrance Stairs-Rails: None Home Layout: One level     Bathroom Shower/Tub: Occupational psychologist: Handicapped height Bathroom Accessibility: Yes How Accessible: Accessible via walker Home Equipment: [place] - single point;Shower seat - built in;Grab bars - toilet;Grab bars - tub/shower          Prior Functioning/Environment Level of Independence: Independent with assistive device(s)        Comments: pt stated she has been ambulating with SPC for ~3 months secondary to pain. She was independent with all ADLs prior to admission.        OT Problem List: Decreased strength;Impaired balance (sitting and/or standing);Decreased activity tolerance;Decreased knowledge of use of DME or AE;Decreased knowledge of precautions;Pain   OT Treatment/Interventions: Self-care/ADL training;DME and/or AE instruction;Therapeutic activities;Patient/family education;Balance training    OT Goals(Current goals can be found in the care plan section) Acute Rehab OT Goals Patient Stated Goal: return home OT Goal Formulation: With patient Time For Goal Achievement: 08/07/16 Potential to Achieve Goals: Good ADL Goals Pt Will Perform Lower Body Bathing: with supervision;sit to/from stand Pt Will Perform Lower Body Dressing: with supervision;sit to/from stand Pt Will Perform Tub/Shower Transfer: Shower transfer;with supervision;ambulating;shower seat;rolling walker  OT Frequency: Min 2X/week   Barriers to D/C:            Co-evaluation              End of Session Nurse Communication: Weight bearing status  Activity Tolerance: Patient tolerated treatment well Patient left: Other (comment) (with PT)   Time: [id number] OT Time Calculation (min): 13 min Charges:  OT General  Charges $OT Visit: 1 Procedure OT Evaluation $OT Eval Moderate Complexity: 1 Procedure G-Codes:     Binnie Kand M.S., OTR/L Pager: [phone]  07/24/2016, 4:48 PM

## 2016-07-24 NOTE — Progress Notes (Signed)
    Patient doing well. Minimal well controlled pain PO day 1 S/P R ant THR with acetabular fx. Has been up with PT excellent progress, only concern is step up she has to get into high bed. Eating and drinking well, has been pushing fluids and denies SOB/fatigue/N/V/yes passing gas, NL B/B function. Understands 50% WB.   Physical Exam: BP (!) 98/43 (BP Location: Left Arm)   Pulse 81   Temp 98.2 F (36.8 C) (Oral)   Resp 18   Ht 5\' 3"  (1.6 m)   Wt 68.9 kg (151 lb 14 oz)   SpO2 96%   BMI 26.90 kg/m   CBC Latest Ref Rng & Units 07/24/2016 07/12/2016 07/11/2015  WBC 4.0 - 10.5 K/uL 6.8 6.1 5.1  Hemoglobin 12.0 - 15.0 g/dL 10.3(L) 13.3 10.1(L)  Hematocrit 36.0 - 46.0 % 31.7(L) 41.1 30.0(L)  Platelets 150 - 400 K/uL 154 213 158  Some recent data might be hidden    Dressing in place, CDI, pt sitting up in recliner reading comfortably. Distal compartments soft, NVI  POD #1 s/p R ANT THR with acetabular fx by Dr Shaune Spittle team, doing wonderful  - up with PT/OT, encourage ambulation 50% WBAT - Percocet for pain, Robaxin for muscle spasms - Home coumadin resumed for anticoagulation  - Hypotension asymptomatic, pt drinking a lot of fluids, states she "always has low pressure after surgery and gets dehydrated" will continue to monitor, if symptomatic or lowers additionally IV bolus will be administered - likely d/c home tomorrow after PT

## 2016-07-25 LAB — PROTIME-INR
INR: 2.07
PROTHROMBIN TIME: 23.6 s — AB (ref 11.4–15.2)

## 2016-07-25 LAB — CBC
HCT: 30.9 % — ABNORMAL LOW (ref 36.0–46.0)
Hemoglobin: 10.1 g/dL — ABNORMAL LOW (ref 12.0–15.0)
MCH: 32.1 pg (ref 26.0–34.0)
MCHC: 32.7 g/dL (ref 30.0–36.0)
MCV: 98.1 fL (ref 78.0–100.0)
PLATELETS: 145 10*3/uL — AB (ref 150–400)
RBC: 3.15 MIL/uL — AB (ref 3.87–5.11)
RDW: 13.5 % (ref 11.5–15.5)
WBC: 10 10*3/uL (ref 4.0–10.5)

## 2016-07-25 MED ORDER — WARFARIN SODIUM 5 MG PO TABS: 5.0000 mg | ORAL_TABLET | Freq: Once | ORAL | Status: DC

## 2016-07-25 NOTE — Progress Notes (Signed)
Physical Therapy Treatment Patient Details Name: Terri Small MRN: [id number] DOB: May 13, 1931 Today's Date: 07/25/2016    History of Present Illness Pt is an 82 y/o female s/p R THA with acetabular fx. PMH including but not limited to CHF, COPD, and L TSA in 2016.    PT Comments    Patient doing well with gait and mobility.  Feel patient safe for d/c home with supervision of daughter from PT standpoint.    Follow Up Recommendations  Home health PT;Supervision for mobility/OOB     Equipment Recommendations  3in1 (PT)    Recommendations for Other Services       Precautions / Restrictions Precautions Precautions: Fall Precaution Comments: Anterior hip Restrictions Weight Bearing Restrictions: (P) Yes RLE Weight Bearing: Partial weight bearing RLE Partial Weight Bearing Percentage or Pounds: 50    Mobility  Bed Mobility Overal bed mobility: Modified Independent Bed Mobility: Supine to Sit     Supine to sit: Modified independent (Device/Increase time)        Transfers Overall transfer level: Needs assistance Equipment used: Rolling walker (2 wheeled) Transfers: Sit to/from Stand Sit to Stand: Supervision         General transfer comment: Supervision for safety. Good hand placement and technique. Increased time required.  Ambulation/Gait Ambulation/Gait assistance: Modified independent (Device/Increase time) Ambulation Distance (Feet): 75 Feet Assistive device: Rolling walker (2 wheeled) Gait Pattern/deviations: Step-to pattern;Decreased step length - left Gait velocity: decreased   General Gait Details: able to maintain PWB throughout ambulation   Stairs   Stairs assistance: Min guard Stair Management: No rails;Backwards;With walker Number of Stairs: 1 General stair comments: pt ascended with L LE leading and descended with R LE leading  Wheelchair Mobility    Modified Rankin (Stroke Patients Only)       Balance Overall balance assessment: Needs  assistance Sitting-balance support: No upper extremity supported;Feet supported Sitting balance-Leahy Scale: Good     Standing balance support: During functional activity;Bilateral upper extremity supported Standing balance-Leahy Scale: Poor Standing balance comment: reliant on RW for balance due to PWB status                    Cognition Arousal/Alertness: Awake/alert Behavior During Therapy: WFL for tasks assessed/performed Overall Cognitive Status: Within Functional Limits for tasks assessed                      Exercises Total Joint Exercises Ankle Circles/Pumps: AROM;Both;10 reps;Seated Quad Sets: AROM;Strengthening;Right;10 reps;Seated Gluteal Sets: AROM;Both;10 reps;Seated Long Arc Quad: AROM;Right;10 reps;Seated Marching in Standing: AROM;Right;5 reps;Standing Standing Hip Extension: AROM;Right;5 reps;Standing    General Comments        Pertinent Vitals/Pain Pain Assessment: 0-10 Pain Score: 2  Pain Location: right leg Pain Descriptors / Indicators: Sore;Guarding Pain Intervention(s): Limited activity within patient's tolerance;Monitored during session    Home Living                      Prior Function            PT Goals (current goals can now be found in the care plan section) Progress towards PT goals: Goals met and updated - see care plan    Frequency    7X/week      PT Plan Current plan remains appropriate    Co-evaluation             End of Session Equipment Utilized During Treatment: Gait belt Activity Tolerance: Patient tolerated treatment well;No increased pain  Patient left: in chair;with call bell/phone within reach     Time: 0845-0911 PT Time Calculation (min) (ACUTE ONLY): 26 min  Charges:  $Gait Training: 8-22 mins $Therapeutic Exercise: 8-22 mins                    G Codes:      Shanna Cisco 08/10/16, 9:22 AM  08/10/16 [place], [place]

## 2016-07-25 NOTE — Progress Notes (Signed)
    Patient doing well. Minimal well controlled pain PO day 2 S/P R ant THR with acetabular fx. Has been up with PT excellent progress, learned step up with PT for high bed. Cleared for D/C. Eating and drinking well, has been pushing fluids and denies SOB/fatigue/N/V/yes passing gas, NL B/B function. Understands 50% WB.   Physical Exam: Vitals:   07/25/16 0840 07/25/16 1152  BP: 118/81 117/61  Pulse:  67  Resp:  16  Temp:  98.4 F (36.9 C)    Dressing in place, CDI, distal compartments soft  NVI  POD #2 s/p R ANT THR with acetabular fx by Dr Damita Dunnings team, doing wonderful  - Cleared by PT/OT, encourage ambulation 50% WBAT  -HH ordered and established - Percocet for pain, Robaxin for muscle spasms - Home coumadin resumed for anticoagulation, pt comfortable resuming prior protocol  - d/c home today - 50% WB - F/U in office with Dr Mayer Camel, appt established

## 2016-07-25 NOTE — Progress Notes (Signed)
Reviewed discharge instructions/medications with patient.  Answered all of her questions.  Patient is waiting on her daughter to pick her up.

## 2016-07-25 NOTE — Progress Notes (Signed)
ANTICOAGULATION CONSULT NOTE - Follow Up  Pharmacy Consult for Coumadin Indication: VTE prophylaxis  Allergies  Allergen Reactions  . Nexium [Esomeprazole Magnesium] Other (See Comments)    Chest heaviness.  . Sulfonamide Derivatives Other (See Comments)    REACTION: rash- burning on face  . Nitrofurantoin Other (See Comments)    Pulmonary fibrosis     Patient Measurements: Height: 5\' 3"  (160 cm) Weight: 151 lb 14 oz (68.9 kg) IBW/kg (Calculated) : 52.4  Vital Signs: Temp: 97.8 F (36.6 C) (10/08 0504) Temp Source: Oral (10/08 0504) BP: 118/81 (10/08 0840) Pulse Rate: 78 (10/08 0504)  Labs:  Recent Labs  07/23/16 0807 07/24/16 0400 07/25/16 0344  HGB  --  10.3* 10.1*  HCT  --  31.7* 30.9*  PLT  --  154 145*  APTT 41*  --   --   LABPROT 15.0 16.9* 23.6*  INR 1.18 1.36 2.07    Estimated Creatinine Clearance: 39.7 mL/min (by C-G formula based on SCr of 1 mg/dL).   Medical History: Past Medical History:  Diagnosis Date  . Arthritis    "everywhere"  . Atrial fibrillation ([place])    s/p 2x ablation on chronic coumadin, previously on amiodarone but had pulmonary SE.  Marland Kitchen CHF (congestive heart failure) ([place])   . COPD (chronic obstructive pulmonary disease) [facility])    "pulmonologist told me last week my lungs are good" (07/10/2015)  . Diverticulitis   . Dysrhythmia   . Fracture of lateral malleolus 11/17/2013  . Fracture, fibula 11/26/2013  . Fracture, sternum closed 11/26/13  . GERD (gastroesophageal reflux disease)   . Headache   . Herpes zoster   . Osteoporosis   . Shortness of breath dyspnea     Medications:  Prescriptions Prior to Admission  Medication Sig Dispense Refill Last Dose  . acetaminophen (TYLENOL) 650 MG CR tablet Take 1,300 mg by mouth daily as needed for pain.   Past Month at Unknown time  . diltiazem (CARDIZEM CD) 120 MG 24 hr capsule Take 120 mg by mouth daily.   07/22/2016 at Unknown time  . glucosamine-chondroitin 500-400 MG tablet Take 1 tablet  by mouth daily.    07/22/2016 at Unknown time  . metoprolol succinate (TOPROL-XL) 25 MG 24 hr tablet Take 25 mg by mouth every evening.  3 07/22/2016 at Unknown time  . Multiple Vitamins-Minerals (PRESERVISION AREDS 2 PO) Take 1 tablet by mouth 2 (two) times daily.   07/22/2016 at Unknown time  . PREVIDENT 5000 SENSITIVE 1.1-5 % PSTE Take 1 application by mouth at bedtime.   6 Taking  . warfarin (COUMADIN) 5 MG tablet TAKE 1 TABLET BY MOUTH EVERY DAY (Patient taking differently: Take 5mg s daily except on saturdays take 2.5mg ) 90 tablet 2 07/18/2016   Scheduled:  . diltiazem  120 mg Oral Daily  . docusate sodium  100 mg Oral BID  . metoprolol succinate  25 mg Oral QPM  . Warfarin - Pharmacist Dosing Inpatient   Does not apply q1800    Assessment: 82 yo f s/p right THR. Pt on coumadin PTA for h/o afib. Preop INR subtherapeutic at 1.18 (last dose of coumadin 10/1). INR 1.36 then 2.07 this AM. CBC stable.  PTA coumadin dose - 5 mg daily except 2.5 mg every Saturday  Goal of Therapy:  INR 2-3 Monitor platelets by anticoagulation protocol: Yes   Plan:  - Coumadin 5 mg PO x 1 tonight - Daily INR - Monitor for s/sx of bleeding  Mihira Tozzi L. [place] Pres[place], PharmD Clinical  Pharmacist Pager: [phone] 07/25/2016 9:33 AM

## 2016-07-25 NOTE — Care Management Note (Addendum)
Case Management Note  Patient Details  Name: Terri Small MRN: [id number] Date of Birth: 04/11/1931  Subjective/Objective:                  s/p R THA with acetabular fx Action/Plan: Discharge planning Expected Discharge Date:  07/24/16               Expected Discharge Plan:  [place]  In-House Referral:     Discharge planning Services  CM Consult  Post Acute Care Choice:  Home Health Choice offered to:  Patient  DME Arranged:  3-N-1 DME Agency:  [place]:  RN, PT, OT, Social Work [employer] Agency:  [place]  Status of Service:  Completed, signed off  If discussed at [employer], dates discussed:    Additional CommentsCM received message from RN MD states no INR needs to be done by [facility].  CM spoke with pt to offer choice of home health agency.  Pt chooses [facility] to render HHPT/OT/RN/SW.  CM has asked RN to have MD place specific INR draw times and to call to PCP for dosing included in [facility] order.  Referral called to [facility] rep, Jermaine who is waiting for [facility] modification to be completed.  CM notified [place] DME rep, Reggie to please deliver the 3n1 to room. Pt states she has her own rolling walker in her room. No other CM needs were communicated. Dellie Catholic, RN 07/25/2016, 12:27 PM

## 2016-07-26 ENCOUNTER — Encounter (HOSPITAL_COMMUNITY): Payer: Self-pay | Admitting: Orthopedic Surgery

## 2016-07-26 ENCOUNTER — Telehealth: Payer: Self-pay

## 2016-07-26 NOTE — Telephone Encounter (Signed)
[facility] reports that Terri Small INR was 2.09 yesterday.  She would like to know when should this be repeated. Please advise.

## 2016-07-26 NOTE — Telephone Encounter (Signed)
Repeat in 10 days.

## 2016-07-26 NOTE — Telephone Encounter (Signed)
Diane [phone]) from [facility] notified

## 2016-08-04 ENCOUNTER — Ambulatory Visit (INDEPENDENT_AMBULATORY_CARE_PROVIDER_SITE_OTHER): Payer: Medicare Other | Admitting: Family Medicine

## 2016-08-04 DIAGNOSIS — I482 Chronic atrial fibrillation, unspecified: Secondary | ICD-10-CM

## 2016-08-04 LAB — POCT INR: INR: 2.5

## 2016-08-04 NOTE — Discharge Summary (Signed)
Patient ID: Terri Small MRN: [id number] DOB/AGE: 80-Feb-1932 80 y.o.  Admit date: 07/23/2016 Discharge date: 08/04/2016  Admission Diagnoses:  Principal Problem:   Primary osteoarthritis of right hip Active Problems:   Arthritis of right hip   Discharge Diagnoses:  Same  Past Medical History:  Diagnosis Date  . Arthritis    "everywhere"  . Atrial fibrillation ([place])    s/p 2x ablation on chronic coumadin, previously on amiodarone but had pulmonary SE.  Marland Kitchen CHF (congestive heart failure) ([place])   . COPD (chronic obstructive pulmonary disease) [facility])    "pulmonologist told me last week my lungs are good" (07/10/2015)  . Diverticulitis   . Dysrhythmia   . Fracture of lateral malleolus 11/17/2013  . Fracture, fibula 11/26/2013  . Fracture, sternum closed 11/26/13  . GERD (gastroesophageal reflux disease)   . Headache   . Herpes zoster   . Osteoporosis   . Shortness of breath dyspnea     Surgeries: Procedure(s): RIGHT TOTAL HIP ARTHROPLASTY ANTERIOR APPROACH on 07/23/2016   Consultants:   Discharged Condition: Improved  Hospital Course: Terri Small is an 82 y.o. female who was admitted 07/23/2016 for operative treatment ofPrimary osteoarthritis of right hip. Patient has severe unremitting pain that affects sleep, daily activities, and work/hobbies. After pre-op clearance the patient was taken to the operating room on 07/23/2016 and underwent  Procedure(s): RIGHT TOTAL HIP ARTHROPLASTY ANTERIOR APPROACH.    Patient was given perioperative antibiotics: Anti-infectives    Start     Dose/Rate Route Frequency Ordered Stop   07/23/16 0801  ceFAZolin (ANCEF) IVPB 2g/100 mL premix     2 g 200 mL/hr over 30 Minutes Intravenous On call to O.R. 07/23/16 0801 07/23/16 1008       Patient was given sequential compression devices, early ambulation, and chemoprophylaxis to prevent DVT.  Patient benefited maximally from hospital stay and there were no complications.    Recent vital signs: No data  found.    Recent laboratory studies:  Recent Labs  08/04/16 1108  INR 2.5     Discharge Medications:     Medication List    STOP taking these medications   acetaminophen 650 MG CR tablet Commonly known as:  TYLENOL     TAKE these medications   diltiazem 120 MG 24 hr capsule Commonly known as:  CARDIZEM CD Take 120 mg by mouth daily.   glucosamine-chondroitin 500-400 MG tablet Take 1 tablet by mouth daily.   metoprolol succinate 25 MG 24 hr tablet Commonly known as:  TOPROL-XL Take 25 mg by mouth every evening.   oxyCODONE-acetaminophen 5-325 MG tablet Commonly known as:  ROXICET Take 1 tablet by mouth every 4 (four) hours as needed.   PRESERVISION AREDS 2 PO Take 1 tablet by mouth 2 (two) times daily.   PREVIDENT 5000 SENSITIVE 1.1-5 % Pste Generic drug:  Sod Fluoride-Potassium Nitrate Take 1 application by mouth at bedtime.   tiZANidine 2 MG tablet Commonly known as:  ZANAFLEX Take 1 tablet (2 mg total) by mouth every 6 (six) hours as needed for muscle spasms.   warfarin 5 MG tablet Commonly known as:  COUMADIN TAKE 1 TABLET BY MOUTH EVERY DAY What changed:  See the new instructions.       Diagnostic Studies: Dg Chest 2 View  Result Date: 07/12/2016 CLINICAL DATA:  Preoperative examination. History of atrial fibrillation. EXAM: CHEST  2 VIEW COMPARISON:  07/03/2015 FINDINGS: The cardiac silhouette is mildly enlarged. The aorta is torturous and contains mild atherosclerotic calcifications. Mediastinal  contours appear intact. There is no evidence of focal airspace consolidation, pleural effusion or pneumothorax. Mild thickening of the interstitial markings may represent scarring. Osseous structures are without acute abnormality. Left humeral prosthesis noted. Soft tissues are grossly normal. IMPRESSION: No active cardiopulmonary disease. Mild enlargement of the cardiac silhouette. Atherosclerotic disease of the aorta. Mild interstitial thickening which may  represent scarring versus mild chronic interstitial lung disease. Electronically Signed   By: Fidela Salisbury M.D.   On: 07/12/2016 17:04   Dg C-arm 1-60 Min  Result Date: 07/23/2016 CLINICAL DATA:  Status post right total hip replacement EXAM: DG C-ARM 61-120 MIN; OPERATIVE RIGHT HIP WITH PELVIS COMPARISON:  None. FLUOROSCOPY TIME:  0 minutes 55 seconds; 6 submitted images FINDINGS: A series of frontal images as well as a lateral image show placement of acetabular component of right total hip prosthesis followed by femoral component of the right total hip prosthesis. On final submitted images, the right hip region prosthetic components appear well seated. No acute fracture or dislocation. Left hip joint region appears unremarkable. IMPRESSION: Total hip replacement on the right with prosthetic components appearing well-seated. No fracture or dislocation. Left hip joint appears grossly unremarkable on limited imaging. Electronically Signed   By: Lowella Grip III M.D.   On: 07/23/2016 14:11   Dg Hip Operative Unilat W Or W/o Pelvis Right  Result Date: 07/23/2016 CLINICAL DATA:  Status post right total hip replacement EXAM: DG C-ARM 61-120 MIN; OPERATIVE RIGHT HIP WITH PELVIS COMPARISON:  None. FLUOROSCOPY TIME:  0 minutes 55 seconds; 6 submitted images FINDINGS: A series of frontal images as well as a lateral image show placement of acetabular component of right total hip prosthesis followed by femoral component of the right total hip prosthesis. On final submitted images, the right hip region prosthetic components appear well seated. No acute fracture or dislocation. Left hip joint region appears unremarkable. IMPRESSION: Total hip replacement on the right with prosthetic components appearing well-seated. No fracture or dislocation. Left hip joint appears grossly unremarkable on limited imaging. Electronically Signed   By: Lowella Grip III M.D.   On: 07/23/2016 14:11    Disposition: 01-Home  or Self Care  Discharge Instructions    Partial weight bearing    Complete by:  As directed    % Body Weight:  50   Laterality:  right   Extremity:  Lower      Follow-up Information    ROWAN,FRANK J, MD Follow up in 2 week(s).   Specialty:  Orthopedic Surgery Contact information: [place] [address] [address] .   Why:  home health physical and occupational therapy, nurse for INR draws, social worker, 3n1 (over the commode seat) Contact information: [address] [place] [address] [phone]            Signed: Hardin Negus, Ravenne Wayment R 08/04/2016, 12:05 PM

## 2016-08-04 NOTE — Progress Notes (Signed)
Continue current treatment.  F/u with PCP as arranged.

## 2016-08-04 NOTE — Progress Notes (Signed)
INR is 2.5 today.

## 2016-08-06 ENCOUNTER — Ambulatory Visit: Payer: Medicare Other | Admitting: Rehabilitative and Restorative Service Providers"

## 2016-08-23 ENCOUNTER — Ambulatory Visit (INDEPENDENT_AMBULATORY_CARE_PROVIDER_SITE_OTHER): Payer: Medicare Other | Admitting: Physical Therapy

## 2016-08-23 ENCOUNTER — Encounter: Payer: Self-pay | Admitting: Physical Therapy

## 2016-08-23 DIAGNOSIS — M25551 Pain in right hip: Secondary | ICD-10-CM | POA: Diagnosis not present

## 2016-08-23 DIAGNOSIS — M6281 Muscle weakness (generalized): Secondary | ICD-10-CM

## 2016-08-23 DIAGNOSIS — R262 Difficulty in walking, not elsewhere classified: Secondary | ICD-10-CM | POA: Diagnosis not present

## 2016-08-23 NOTE — Therapy (Signed)
[place], [place], [address] Phone: [phone]   Fax:  [phone]  Physical Therapy Evaluation  Patient Details  Name: Nakayah Small MRN: [id number] Date of Birth: [birth year] Referring Provider: Frederik Pear  Encounter Date: 08/23/2016      PT End of Session - 08/23/16 0955    PT Start Time 0923   PT Stop Time 0953   PT Time Calculation (min) 30 min   Activity Tolerance Patient tolerated treatment well   Behavior During Therapy [facility] for tasks assessed/performed      Past Medical History:  Diagnosis Date  . Arthritis    "everywhere"  . Atrial fibrillation ([place])    s/p 2x ablation on chronic coumadin, previously on amiodarone but had pulmonary SE.  Marland Kitchen CHF (congestive heart failure) ([place])   . COPD (chronic obstructive pulmonary disease) [facility])    "pulmonologist told me last week my lungs are good" (07/10/2015)  . Diverticulitis   . Dysrhythmia   . Fracture of lateral malleolus 11/17/2013  . Fracture, fibula 11/26/2013  . Fracture, sternum closed 11/26/13  . GERD (gastroesophageal reflux disease)   . Headache   . Herpes zoster   . Osteoporosis   . Shortness of breath dyspnea     Past Surgical History:  Procedure Laterality Date  . ATRIAL FIBRILLATION ABLATION  ~ 2002; ~ 2004  . CARDIAC CATHETERIZATION  ~ 2002; ~ 2004  . COLONOSCOPY    . JOINT REPLACEMENT    . LAPAROSCOPIC CHOLECYSTECTOMY  ~ 2009  . TOTAL HIP ARTHROPLASTY Right 07/23/2016   Procedure: RIGHT TOTAL HIP ARTHROPLASTY ANTERIOR APPROACH;  Surgeon: Frederik Pear, MD;  Location: [place];  Service: Orthopedics;  Laterality: Right;  . TOTAL SHOULDER ARTHROPLASTY Left 07/10/2015  . TOTAL SHOULDER ARTHROPLASTY Left 07/10/2015   Procedure: LEFT TOTAL SHOULDER ARTHROPLASTY;  Surgeon: Tania Ade, MD;  Location: [place];  Service: Orthopedics;  Laterality: Left;  Left shoulder arthroplasty  . VAGINAL HYSTERECTOMY      There were no vitals filed  for this visit.       Subjective Assessment - 08/23/16 0924    Subjective Pt s/p THA 06/23/16. She has been using a SPC for gait at home, RW for long distances. Pt has completed HHPT and [facility] Pt recently cleared for full wt bearing on Rt hip   Limitations Walking   How long can you walk comfortably? 1 hour   Patient Stated Goals walk without the cane   Currently in Pain? Yes   Pain Score 3    Pain Location Hip   Pain Orientation Right   Pain Descriptors / Indicators Aching   Pain Type Surgical pain   Pain Frequency Intermittent   Aggravating Factors  walking   Pain Relieving Factors rest, heat            [facility] PT Assessment - 08/23/16 0001      Assessment   Medical Diagnosis Rt THA   Referring Provider Frederik Pear   Onset Date/Surgical Date 06/23/16   Hand Dominance Right     Precautions   Precautions Anterior Hip  anterior THA     Restrictions   Weight Bearing Restrictions No   Other Position/Activity Restrictions WBAT     Balance Screen   Has the patient fallen in the past 6 months No     Home Environment   Additional Comments pt in one level home, 1 step to enter, no  difficulties     Prior Function   Level of Independence Independent     Cognition   Overall Cognitive Status Within Functional Limits for tasks assessed     Observation/Other Assessments   Focus on Therapeutic Outcomes (FOTO)  49% limited     ROM / Strength   AROM / PROM / Strength AROM;Strength     AROM   Overall AROM Comments Rt hip WFL flex, ext to neutral (anterior hip precautions)     Strength   Overall Strength Comments Rt hip flex 3/5, abd 3-/5, ext 3-/5     Palpation   Palpation comment tender to palpation around surgical site     Special Tests    Special Tests --  5 time sit to stand: 18 seconds, TUG: 26.75 seconds     Ambulation/Gait   Assistive device Straight cane     6 minute walk test results    Aerobic Endurance Distance  Walked 650  with [facility]                           PT Education - 08/23/16 0949    Education provided Yes   Education Details PT POC   Person(s) Educated Patient   Methods Explanation   Comprehension Verbalized understanding          PT Short Term Goals - 08/23/16 0957      PT SHORT TERM GOAL #1   Title Pt will be independent in initial HEP   Time 3   Period Weeks   Status New     PT SHORT TERM GOAL #2   Title Pt will improve Rt hip strength to 3+/5 to improve functional mobility   Time 6   Period Weeks   Status New     PT SHORT TERM GOAL #3   Title Pt will improve TUG to <= 16.5 seconds to demo functional improvement   Time 6   Period Weeks   Status New           PT Long Term Goals - 08/23/16 [id number]      PT LONG TERM GOAL #1   Title Pt will improve Rt LE strength to 4/5 to be able to stand longer with decreased symptoms   Time 6   Period Weeks   Status New     PT LONG TERM GOAL #2   Title Pt will perform gait without AD for household distances   Time 6   Period Weeks   Status New     PT LONG TERM GOAL #3   Title Pt will improve 6 minute walk test to 750' to demo improved functional endurance   Time 6   Period Weeks   Status New     PT LONG TERM GOAL #4   Title Pt will improve FOTO to <= 39% to deom improved functional mobility   Time 6   Period Weeks   Status New               Plan - 08/23/16 0955    Rehab Potential Good   PT Frequency 2x / week   PT Duration 6 weeks   PT Treatment/Interventions Patient/family education;Therapeutic exercise;Therapeutic activities;Manual techniques;Neuromuscular re-education;Cryotherapy;Electrical Stimulation;Moist Heat;Ultrasound;DME Instruction;Gait training;Stair training;Functional mobility training;Balance training;Taping;Passive range of motion   PT Next Visit Plan Assess HEP, progress therex as appropriate, modalities as needed   PT Home Exercise Plan Pt with exercise plan from HHPT  including Rt hip  strength in supine, seated and standing   Consulted and Agree with Plan of Care Patient      Patient will benefit from skilled therapeutic intervention in order to improve the following deficits and impairments:  Abnormal gait, Decreased activity tolerance, Decreased strength, Pain, Difficulty walking, Decreased balance, Decreased endurance, Decreased mobility  Visit Diagnosis: Muscle weakness (generalized) - Plan: PT plan of care cert/re-cert  Pain of right hip joint - Plan: PT plan of care cert/re-cert  Difficulty in walking, not elsewhere classified - Plan: PT plan of care cert/re-cert     Problem List Patient Active Problem List   Diagnosis Date Noted  . Arthritis of right hip 07/23/2016  . Primary osteoarthritis of right hip 07/22/2016    Class: Chronic  . Skin lesion of left arm 03/22/2016  . Squamous cell skin cancer 03/22/2016  . Foot pain, bilateral 12/10/2015  . Glenohumeral arthritis 07/10/2015  . Upper airway cough syndrome 07/03/2015  . Fever blister 11/06/2014  . Chronic diastolic heart failure ([place]) 12/25/2013  . Cardiomyopathy ([place]) 12/21/2013  . Pulmonary fibrosis ([place]) 10/16/2013  . TR (tricuspid regurgitation) 10/16/2013  . Congestive heart failure with left ventricular diastolic dysfunction ([place]) 08/27/2013  . Secondary pulmonary hypertension 08/27/2013  . CAFL (chronic airflow limitation) ([place]) 08/16/2013  . Acid reflux 08/16/2013  . MI (mitral incompetence) 06/30/2011  . History of recurrent UTI (urinary tract infection) 04/21/2010  . Atrial fibrillation ([place]) 05/13/2009  . OSTEOPOROSIS 05/13/2009  . HERPES ZOSTER 02/28/2009    Isabelle Course, PT, DPT 08/23/2016, 10:05 AM  [facility] [place], [place], [address] Phone: [phone]   Fax:  [phone]  Name: Terri Small MRN: [id number] Date of Birth: May 29, 1931

## 2016-08-27 ENCOUNTER — Ambulatory Visit (INDEPENDENT_AMBULATORY_CARE_PROVIDER_SITE_OTHER): Payer: Medicare Other | Admitting: Physical Therapy

## 2016-08-27 DIAGNOSIS — R262 Difficulty in walking, not elsewhere classified: Secondary | ICD-10-CM

## 2016-08-27 DIAGNOSIS — M25551 Pain in right hip: Secondary | ICD-10-CM | POA: Diagnosis not present

## 2016-08-27 DIAGNOSIS — M6281 Muscle weakness (generalized): Secondary | ICD-10-CM | POA: Diagnosis present

## 2016-08-27 NOTE — Patient Instructions (Signed)
    Can eliminate these exercises:   On back - sliding leg out to side, Standing lifting leg to side and back.    For the bridges, hold longer - 8-10 sec with hips up.      [facility] at [facility] [place], [place] [address]  [phone] (office) [phone] (fax)

## 2016-08-27 NOTE — Therapy (Signed)
[place], [place], [address] Phone: [phone]   Fax:  [phone]  Physical Therapy Treatment  Patient Details  Name: Terri Small MRN: [id number] Date of Birth: Nov 14, 1930 Referring Provider: Frederik Pear  Encounter Date: 08/27/2016      PT End of Session - 08/27/16 1547    Visit Number 2   Number of Visits 12   Date for PT Re-Evaluation 10/04/16   PT Start Time [id number]   PT Stop Time 1625   PT Time Calculation (min) 50 min   Activity Tolerance Patient tolerated treatment well;No increased pain   Behavior During Therapy WFL for tasks assessed/performed      Past Medical History:  Diagnosis Date  . Arthritis    "everywhere"  . Atrial fibrillation ([place])    s/p 2x ablation on chronic coumadin, previously on amiodarone but had pulmonary SE.  Marland Kitchen CHF (congestive heart failure) ([place])   . COPD (chronic obstructive pulmonary disease) [facility])    "pulmonologist told me last week my lungs are good" (07/10/2015)  . Diverticulitis   . Dysrhythmia   . Fracture of lateral malleolus 11/17/2013  . Fracture, fibula 11/26/2013  . Fracture, sternum closed 11/26/13  . GERD (gastroesophageal reflux disease)   . Headache   . Herpes zoster   . Osteoporosis   . Shortness of breath dyspnea     Past Surgical History:  Procedure Laterality Date  . ATRIAL FIBRILLATION ABLATION  ~ 2002; ~ 2004  . CARDIAC CATHETERIZATION  ~ 2002; ~ 2004  . COLONOSCOPY    . JOINT REPLACEMENT    . LAPAROSCOPIC CHOLECYSTECTOMY  ~ 2009  . TOTAL HIP ARTHROPLASTY Right 07/23/2016   Procedure: RIGHT TOTAL HIP ARTHROPLASTY ANTERIOR APPROACH;  Surgeon: Frederik Pear, MD;  Location: [facility];  Service: Orthopedics;  Laterality: Right;  . TOTAL SHOULDER ARTHROPLASTY Left 07/10/2015  . TOTAL SHOULDER ARTHROPLASTY Left 07/10/2015   Procedure: LEFT TOTAL SHOULDER ARTHROPLASTY;  Surgeon: Tania Ade, MD;  Location: [place];  Service: Orthopedics;   Laterality: Left;  Left shoulder arthroplasty  . VAGINAL HYSTERECTOMY      There were no vitals filed for this visit.      Subjective Assessment - 08/27/16 1550    Subjective Pt reports she is now walking with SPC except in middle of night (uses RW).  She continues with HEP from HHPT (supine: SLR, hip ABD/ADD, bridge, seated: LAQ, clam shells with blue band, standing:  walking beside counter, SLR 3 way, marching, hamstring curls, heel/toe raises.)    Currently in Pain? Yes   Pain Score 2    Pain Location Hip   Pain Orientation Right   Pain Descriptors / Indicators Aching   Aggravating Factors  prolonged walking   Pain Relieving Factors rest, heat            [facility] PT Assessment - 08/27/16 0001      Assessment   Medical Diagnosis Rt THA   Referring Provider Frederik Pear   Onset Date/Surgical Date 06/23/16   Hand Dominance Right     Precautions   Precautions Anterior Hip  anterior THA - per Op report - no restrictions besides 50% WB after surgery.           [facility] Adult PT Treatment/Exercise - 08/27/16 0001      Exercises   Exercises Knee/Hip     Knee/Hip Exercises: Stretches   Passive Hamstring Stretch Right;Left;2 reps;20 seconds     Knee/Hip Exercises: Aerobic  Nustep L4: 4 mi n     Knee/Hip Exercises: Standing   Heel Raises Both;2 sets;10 reps   Hip Flexion Knee bent;10 reps   Functional Squat 10 reps;2 sets     Knee/Hip Exercises: Seated   Long Arc Quad Right;2 sets;10 reps  yellow band   Other Seated Knee/Hip Exercises hamstring curls x 15 reps with yellow band    Other Seated Knee/Hip Exercises clam shells with green band x 15 reps      Knee/Hip Exercises: Supine   Bridges 10 reps;2 sets  10 sec hold in ext     Knee/Hip Exercises: Sidelying   Hip ABduction Strengthening;Right;1 set;10 reps     Moist Heat Therapy   Number Minutes Moist Heat 15 Minutes   Moist Heat Location Hip  Rt                PT Education - 08/27/16 1639     Education provided Yes   Education Details HEP    Person(s) Educated Patient   Methods Explanation;Demonstration;Handout   Comprehension Verbalized understanding;Returned demonstration          PT Short Term Goals - 08/23/16 0957      PT SHORT TERM GOAL #1   Title Pt will be independent in initial HEP   Time 3   Period Weeks   Status New     PT SHORT TERM GOAL #2   Title Pt will improve Rt hip strength to 3+/5 to improve functional mobility   Time 6   Period Weeks   Status New     PT SHORT TERM GOAL #3   Title Pt will improve TUG to <= 16.5 seconds to demo functional improvement   Time 6   Period Weeks   Status New           PT Long Term Goals - 08/23/16 [id number]      PT LONG TERM GOAL #1   Title Pt will improve Rt LE strength to 4/5 to be able to stand longer with decreased symptoms   Time 6   Period Weeks   Status New     PT LONG TERM GOAL #2   Title Pt will perform gait without AD for household distances   Time 6   Period Weeks   Status New     PT LONG TERM GOAL #3   Title Pt will improve 6 minute walk test to 750' to demo improved functional endurance   Time 6   Period Weeks   Status New     PT LONG TERM GOAL #4   Title Pt will improve FOTO to <= 39% to deom improved functional mobility   Time 6   Period Weeks   Status New               Plan - 08/27/16 1630    Clinical Impression Statement Pt tolerated all exercises well, some of the existing exercises we added resistance to without any difficulty.  Pt will benefit from proprioceptive/balance exercises to increase safety with gait and functional mobility.    Rehab Potential Good   PT Frequency 2x / week   PT Duration 6 weeks   PT Treatment/Interventions Patient/family education;Therapeutic exercise;Therapeutic activities;Manual techniques;Neuromuscular re-education;Cryotherapy;Electrical Stimulation;Moist Heat;Ultrasound;DME Instruction;Gait training;Stair training;Functional mobility  training;Balance training;Taping;Passive range of motion   PT Next Visit Plan Continue progressive Rt hip strengthening / proprioceptive exercises.    Consulted and Agree with Plan of Care Patient      Patient will  benefit from skilled therapeutic intervention in order to improve the following deficits and impairments:  Abnormal gait, Decreased activity tolerance, Decreased strength, Pain, Difficulty walking, Decreased balance, Decreased endurance, Decreased mobility  Visit Diagnosis: Muscle weakness (generalized)  Pain of right hip joint  Difficulty in walking, not elsewhere classified     Problem List Patient Active Problem List   Diagnosis Date Noted  . Arthritis of right hip 07/23/2016  . Primary osteoarthritis of right hip 07/22/2016    Class: Chronic  . Skin lesion of left arm 03/22/2016  . Squamous cell skin cancer 03/22/2016  . Foot pain, bilateral 12/10/2015  . Glenohumeral arthritis 07/10/2015  . Upper airway cough syndrome 07/03/2015  . Fever blister 11/06/2014  . Chronic diastolic heart failure ([place]) 12/25/2013  . Cardiomyopathy ([place]) 12/21/2013  . Pulmonary fibrosis ([place]) 10/16/2013  . TR (tricuspid regurgitation) 10/16/2013  . Congestive heart failure with left ventricular diastolic dysfunction ([place]) 08/27/2013  . Secondary pulmonary hypertension 08/27/2013  . CAFL (chronic airflow limitation) ([place]) 08/16/2013  . Acid reflux 08/16/2013  . MI (mitral incompetence) 06/30/2011  . History of recurrent UTI (urinary tract infection) 04/21/2010  . Atrial fibrillation ([place]) 05/13/2009  . OSTEOPOROSIS 05/13/2009  . HERPES ZOSTER 02/28/2009    Shelbie Hutching 08/27/2016, 4:39 PM  [facility] [place], [place], [address] Phone: [phone]   Fax:  [phone]  Name: Syri Zenger MRN: [id number] Date of Birth: [birth year]

## 2016-08-30 ENCOUNTER — Ambulatory Visit (INDEPENDENT_AMBULATORY_CARE_PROVIDER_SITE_OTHER): Payer: Medicare Other | Admitting: Physical Therapy

## 2016-08-30 DIAGNOSIS — M6281 Muscle weakness (generalized): Secondary | ICD-10-CM

## 2016-08-30 DIAGNOSIS — R262 Difficulty in walking, not elsewhere classified: Secondary | ICD-10-CM

## 2016-08-30 DIAGNOSIS — M25551 Pain in right hip: Secondary | ICD-10-CM

## 2016-08-30 NOTE — Patient Instructions (Signed)
For the pool:   Side stepping, forward, and backward walking.   Single leg stance in pool holding on as needed.  Goal of 15 seconds each leg.   (or can do this at counter at home)  Tandem Stance    Right foot in front of left, heel touching toe both feet "straight ahead". Stand on Foot Triangle of Support with both feet. Balance in this position __15_ seconds. Do with left foot in front of right. If this is too easy, turn head left and right.  DO THIS BY COUNTER.    [facility]patient Rehab at [facility] [place], [place] [address]  [phone] (office) [phone] (fax)

## 2016-08-30 NOTE — Therapy (Signed)
[place], [place], [address] Phone: [phone]   Fax:  [phone]  Physical Therapy Treatment  Patient Details  Name: Terri Small MRN: [id number] Date of Birth: 08/10/1931 Referring Provider: Dr. Frederik Pear   Encounter Date: 08/30/2016      PT End of Session - 08/30/16 0856    Visit Number 3   Number of Visits 12   Date for PT Re-Evaluation 10/04/16   PT Start Time 0849   PT Stop Time 0945   PT Time Calculation (min) 56 min   Activity Tolerance Patient tolerated treatment well      Past Medical History:  Diagnosis Date  . Arthritis    "everywhere"  . Atrial fibrillation ([place])    s/p 2x ablation on chronic coumadin, previously on amiodarone but had pulmonary SE.  Marland Small CHF (congestive heart failure) ([place])   . COPD (chronic obstructive pulmonary disease) [facility])    "pulmonologist told me last week my lungs are good" (07/10/2015)  . Diverticulitis   . Dysrhythmia   . Fracture of lateral malleolus 11/17/2013  . Fracture, fibula 11/26/2013  . Fracture, sternum closed 11/26/13  . GERD (gastroesophageal reflux disease)   . Headache   . Herpes zoster   . Osteoporosis   . Shortness of breath dyspnea     Past Surgical History:  Procedure Laterality Date  . ATRIAL FIBRILLATION ABLATION  ~ 2002; ~ 2004  . CARDIAC CATHETERIZATION  ~ 2002; ~ 2004  . COLONOSCOPY    . JOINT REPLACEMENT    . LAPAROSCOPIC CHOLECYSTECTOMY  ~ 2009  . TOTAL HIP ARTHROPLASTY Right 07/23/2016   Procedure: RIGHT TOTAL HIP ARTHROPLASTY ANTERIOR APPROACH;  Surgeon: Frederik Pear, MD;  Location: [place];  Service: Orthopedics;  Laterality: Right;  . TOTAL SHOULDER ARTHROPLASTY Left 07/10/2015  . TOTAL SHOULDER ARTHROPLASTY Left 07/10/2015   Procedure: LEFT TOTAL SHOULDER ARTHROPLASTY;  Surgeon: Tania Ade, MD;  Location: [place];  Service: Orthopedics;  Laterality: Left;  Left shoulder arthroplasty  . VAGINAL HYSTERECTOMY       There were no vitals filed for this visit.      Subjective Assessment - 08/30/16 0859    Subjective Pt reports she had some increased pain in back of Rt hip on Saturday, unsure of what caused the pain. She used tylenol and heat to decrease pain.     Patient Stated Goals walk without the cane   Currently in Pain? Yes   Pain Score 1    Pain Location Hip   Pain Orientation Right;Posterior   Pain Descriptors / Indicators Aching   Aggravating Factors  prolonged walking    Pain Relieving Factors rest, heat, tylenol            [facility] PT Assessment - 08/30/16 0001      Assessment   Medical Diagnosis Rt THA   Referring Provider Dr. Frederik Pear    Onset Date/Surgical Date 06/23/16   Hand Dominance Right     Precautions   Precautions Anterior Hip  anterior THA          [facility] Adult PT Treatment/Exercise - 08/30/16 0001      Knee/Hip Exercises: Stretches   Passive Hamstring Stretch Right;Left;2 reps;20 seconds   Gastroc Stretch Right;Left;2 reps;20 seconds     Knee/Hip Exercises: Aerobic   Nustep L3: 5 min     Knee/Hip Exercises: Standing   Forward Step Up Right;1 set;10 reps;Hand Hold: 1;Step Height: 6"   SLS Rt/ Lt SLS.  multiple         Other Standing Knee Exercises On 3" blue pad:  high knee marching, tandem stance (challenging).  On bare floor:  tandem stance (not challenging), toe taps to 6" step - challenging with Lt stance, Rt toe tap x 10, repeated on 3" step (improved)   Other Standing Knee Exercises side stepping 15 ft x 4 reps with increased step height and occasional UE support.       Moist Heat Therapy   Number Minutes Moist Heat 15 Minutes   Moist Heat Location Hip  Rt     Manual Therapy   Manual Therapy Soft tissue mobilization   Manual therapy comments Pt in Lt sidelying    Soft tissue mobilization TPR to Rt piriformis                   PT Short Term Goals - 08/23/16 0957      PT SHORT TERM GOAL #1   Title Pt will be independent in  initial HEP   Time 3   Period Weeks   Status New     PT SHORT TERM GOAL #2   Title Pt will improve Rt hip strength to 3+/5 to improve functional mobility   Time 6   Period Weeks   Status New     PT SHORT TERM GOAL #3   Title Pt will improve TUG to <= 16.5 seconds to demo functional improvement   Time 6   Period Weeks   Status New           PT Long Term Goals - 08/23/16 [id number]      PT LONG TERM GOAL #1   Title Pt will improve Rt LE strength to 4/5 to be able to stand longer with decreased symptoms   Time 6   Period Weeks   Status New     PT LONG TERM GOAL #2   Title Pt will perform gait without AD for household distances   Time 6   Period Weeks   Status New     PT LONG TERM GOAL #3   Title Pt will improve 6 minute walk test to 750' to demo improved functional endurance   Time 6   Period Weeks   Status New     PT LONG TERM GOAL #4   Title Pt will improve FOTO to <= 39% to deom improved functional mobility   Time 6   Period Weeks   Status New               Plan - 08/30/16 1155    Clinical Impression Statement Pt tolerated all exercises well, without increase in pain. She demonstrated decreased balance with Lt stance activities with Rt hip flexion activities.  Pt point tender along Rt sacral border and Rt hip rotators with manual therapy.   Pt progressing towards established goals.    Rehab Potential Good   PT Frequency 2x / week   PT Duration 6 weeks   PT Treatment/Interventions Patient/family education;Therapeutic exercise;Therapeutic activities;Manual techniques;Neuromuscular re-education;Cryotherapy;Electrical Stimulation;Moist Heat;Ultrasound;DME Instruction;Gait training;Stair training;Functional mobility training;Balance training;Taping;Passive range of motion   PT Next Visit Plan Continue progressive Rt hip strengthening / proprioceptive exercises.    Consulted and Agree with Plan of Care Patient      Patient will benefit from skilled therapeutic  intervention in order to improve the following deficits and impairments:  Abnormal gait, Decreased activity tolerance, Decreased strength, Pain, Difficulty walking, Decreased balance, Decreased endurance, Decreased mobility  Visit Diagnosis: Muscle weakness (generalized)  Pain of right hip joint  Difficulty in walking, not elsewhere classified     Problem List Patient Active Problem List   Diagnosis Date Noted  . Arthritis of right hip 07/23/2016  . Primary osteoarthritis of right hip 07/22/2016    Class: Chronic  . Skin lesion of left arm 03/22/2016  . Squamous cell skin cancer 03/22/2016  . Foot pain, bilateral 12/10/2015  . Glenohumeral arthritis 07/10/2015  . Upper airway cough syndrome 07/03/2015  . Fever blister 11/06/2014  . Chronic diastolic heart failure ([place]) 12/25/2013  . Cardiomyopathy ([place]) 12/21/2013  . Pulmonary fibrosis ([place]) 10/16/2013  . TR (tricuspid regurgitation) 10/16/2013  . Congestive heart failure with left ventricular diastolic dysfunction ([place]) 08/27/2013  . Secondary pulmonary hypertension 08/27/2013  . CAFL (chronic airflow limitation) ([place]) 08/16/2013  . Acid reflux 08/16/2013  . MI (mitral incompetence) 06/30/2011  . History of recurrent UTI (urinary tract infection) 04/21/2010  . Atrial fibrillation ([place]) 05/13/2009  . OSTEOPOROSIS 05/13/2009  . HERPES ZOSTER 02/28/2009   Kerin Perna, PTA 08/30/16 1:18 PM  [place], [place], [address] Phone: [phone]   Fax:  [phone]  Name: Terri Small MRN: [id number] Date of Birth: [birth year]

## 2016-09-02 ENCOUNTER — Ambulatory Visit (INDEPENDENT_AMBULATORY_CARE_PROVIDER_SITE_OTHER): Payer: Medicare Other | Admitting: Family Medicine

## 2016-09-02 ENCOUNTER — Ambulatory Visit (INDEPENDENT_AMBULATORY_CARE_PROVIDER_SITE_OTHER): Payer: Medicare Other | Admitting: Physical Therapy

## 2016-09-02 DIAGNOSIS — M25551 Pain in right hip: Secondary | ICD-10-CM | POA: Diagnosis not present

## 2016-09-02 DIAGNOSIS — M6281 Muscle weakness (generalized): Secondary | ICD-10-CM

## 2016-09-02 DIAGNOSIS — I4891 Unspecified atrial fibrillation: Secondary | ICD-10-CM | POA: Diagnosis not present

## 2016-09-02 DIAGNOSIS — R262 Difficulty in walking, not elsewhere classified: Secondary | ICD-10-CM

## 2016-09-02 LAB — POCT INR: INR: 2.2

## 2016-09-02 MED ORDER — FUROSEMIDE 20 MG PO TABS: 20.0000 mg | ORAL_TABLET | Freq: Every day | ORAL | 2 refills | Status: DC

## 2016-09-02 NOTE — Progress Notes (Signed)
Pt advised of Rx and INR dosage. Will call us back to schedule.

## 2016-09-02 NOTE — Therapy (Signed)
[place], [place], [address] Phone: [phone]   Fax:  [phone]  Physical Therapy Treatment  Patient Details  Name: Terri Small MRN: [id number] Date of Birth: 07-Sep-1931 Referring Provider: Dr. Frederik Pear   Encounter Date: 09/02/2016      PT End of Session - 09/02/16 0851    Visit Number 4   Number of Visits 12   Date for PT Re-Evaluation 10/04/16   PT Start Time 0845   PT Stop Time 0938   PT Time Calculation (min) 53 min      Past Medical History:  Diagnosis Date  . Arthritis    "everywhere"  . Atrial fibrillation ([place])    s/p 2x ablation on chronic coumadin, previously on amiodarone but had pulmonary SE.  Marland Kitchen CHF (congestive heart failure) ([place])   . COPD (chronic obstructive pulmonary disease) [facility])    "pulmonologist told me last week my lungs are good" (07/10/2015)  . Diverticulitis   . Dysrhythmia   . Fracture of lateral malleolus 11/17/2013  . Fracture, fibula 11/26/2013  . Fracture, sternum closed 11/26/13  . GERD (gastroesophageal reflux disease)   . Headache   . Herpes zoster   . Osteoporosis   . Shortness of breath dyspnea     Past Surgical History:  Procedure Laterality Date  . ATRIAL FIBRILLATION ABLATION  ~ 2002; ~ 2004  . CARDIAC CATHETERIZATION  ~ 2002; ~ 2004  . COLONOSCOPY    . JOINT REPLACEMENT    . LAPAROSCOPIC CHOLECYSTECTOMY  ~ 2009  . TOTAL HIP ARTHROPLASTY Right 07/23/2016   Procedure: RIGHT TOTAL HIP ARTHROPLASTY ANTERIOR APPROACH;  Surgeon: Frederik Pear, MD;  Location: [facility];  Service: Orthopedics;  Laterality: Right;  . TOTAL SHOULDER ARTHROPLASTY Left 07/10/2015  . TOTAL SHOULDER ARTHROPLASTY Left 07/10/2015   Procedure: LEFT TOTAL SHOULDER ARTHROPLASTY;  Surgeon: Tania Ade, MD;  Location: [place] [place];  Service: Orthopedics;  Laterality: Left;  Left shoulder arthroplasty  . VAGINAL HYSTERECTOMY      There were no vitals filed for this visit.       Subjective Assessment - 09/02/16 0856    Subjective Pt reports she is achey in her Rt hip today.     Patient Stated Goals walk without the cane   Currently in Pain? Yes   Pain Score 5    Pain Location Hip   Pain Orientation Right;Anterior;Posterior;Lateral            [facility] PT Assessment - 09/02/16 0001      Assessment   Medical Diagnosis Rt THA   Referring Provider Dr. Frederik Pear    Onset Date/Surgical Date 06/23/16   Hand Dominance Right     Precautions   Precautions Anterior Hip  anterior THA          [facility] Adult PT Treatment/Exercise - 09/02/16 0001      Knee/Hip Exercises: Stretches   Passive Hamstring Stretch Right;Left;2 reps;20 seconds   Quad Stretch Right;Left;2 reps;20 seconds  seated, foot under chair   Piriformis Stretch Right;Left;30 seconds;4 reps   Gastroc Stretch Right;Left;2 reps;20 seconds     Knee/Hip Exercises: Aerobic   Nustep L3: 5 min     Knee/Hip Exercises: Standing   Forward Step Up 10 reps;Right;Hand Hold: 2  reciprocal pattern   SLS Tandem stance on blue pad with horiz head turns.    Other Standing Knee Exercises static stance on blue pad with eyes closed (feet shoulder width apart); then open eyes with perterbations  in various directions.      Knee/Hip Exercises: Seated   Other Seated Knee/Hip Exercises seated on dynadisc:  marching x 20, then slow controlled hip flex x 10 on RLE only.       Knee/Hip Exercises: Supine   Bridges 10 reps;1 set  10 sec hold in ext     Moist Heat Therapy   Number Minutes Moist Heat 15 Minutes   Moist Heat Location Hip  Rt                  PT Short Term Goals - 09/02/16 0934      PT SHORT TERM GOAL #1   Title Pt will be independent in initial HEP   Time 3   Period Weeks   Status Achieved     PT SHORT TERM GOAL #2   Title Pt will improve Rt hip strength to 3+/5 to improve functional mobility   Time 6   Period Weeks   Status On-going     PT SHORT TERM GOAL #3   Title Pt will  improve TUG to <= 16.5 seconds to demo functional improvement   Time 6   Period Weeks   Status On-going           PT Long Term Goals - 09/02/16 0935      PT LONG TERM GOAL #1   Title Pt will improve Rt LE strength to 4/5 to be able to stand longer with decreased symptoms   Time 6   Period Weeks   Status On-going     PT LONG TERM GOAL #2   Title Pt will perform gait without AD for household distances   Time 6   Period Weeks   Status Achieved     PT LONG TERM GOAL #3   Title Pt will improve 6 minute walk test to 750' to demo improved functional endurance   Time 6   Period Weeks   Status On-going     PT LONG TERM GOAL #4   Title Pt will improve FOTO to <= 39% to demo mproved functional mobility   Time 6   Period Weeks   Status On-going               Plan - 09/02/16 0927    Clinical Impression Statement Pt tolerated balance and strengthening exercises well, but did have some Rt knee pain with stairs.  She has met STG 1, LTG #2.    Rehab Potential Good   PT Frequency 2x / week   PT Duration 6 weeks   PT Treatment/Interventions Patient/family education;Therapeutic exercise;Therapeutic activities;Manual techniques;Neuromuscular re-education;Cryotherapy;Electrical Stimulation;Moist Heat;Ultrasound;DME Instruction;Gait training;Stair training;Functional mobility training;Balance training;Taping;Passive range of motion   PT Next Visit Plan manual therapy to Rt quad / post hip.   Add Rt hip flexion exercises to prepare for return to driving.  TUG.    Consulted and Agree with Plan of Care Patient      Patient will benefit from skilled therapeutic intervention in order to improve the following deficits and impairments:  Abnormal gait, Decreased activity tolerance, Decreased strength, Pain, Difficulty walking, Decreased balance, Decreased endurance, Decreased mobility  Visit Diagnosis: Muscle weakness (generalized)  Pain of right hip joint  Difficulty in walking, not  elsewhere classified     Problem List Patient Active Problem List   Diagnosis Date Noted  . Arthritis of right hip 07/23/2016  . Primary osteoarthritis of right hip 07/22/2016    Class: Chronic  . Skin lesion of left  arm 03/22/2016  . Squamous cell skin cancer 03/22/2016  . Foot pain, bilateral 12/10/2015  . Glenohumeral arthritis 07/10/2015  . Upper airway cough syndrome 07/03/2015  . Fever blister 11/06/2014  . Chronic diastolic heart failure ([place]) 12/25/2013  . Cardiomyopathy ([place]) 12/21/2013  . Pulmonary fibrosis ([place]) 10/16/2013  . TR (tricuspid regurgitation) 10/16/2013  . Congestive heart failure with left ventricular diastolic dysfunction ([place]) 08/27/2013  . Secondary pulmonary hypertension 08/27/2013  . CAFL (chronic airflow limitation) ([place]) 08/16/2013  . Acid reflux 08/16/2013  . MI (mitral incompetence) 06/30/2011  . History of recurrent UTI (urinary tract infection) 04/21/2010  . Atrial fibrillation ([place]) 05/13/2009  . OSTEOPOROSIS 05/13/2009  . HERPES ZOSTER 02/28/2009   Kerin Perna, PTA 09/02/16 9:36 AM  [facility] [place], [place], [address] Phone: [phone]   Fax:  [phone]  Name: Terri Small MRN: [id number] Date of Birth: 08/11/1931

## 2016-09-02 NOTE — Progress Notes (Signed)
Ok will refilll lasix. Just take when needed.

## 2016-09-03 ENCOUNTER — Ambulatory Visit: Payer: Medicare Other

## 2016-09-06 ENCOUNTER — Ambulatory Visit (INDEPENDENT_AMBULATORY_CARE_PROVIDER_SITE_OTHER): Payer: Medicare Other | Admitting: Physical Therapy

## 2016-09-06 DIAGNOSIS — M25551 Pain in right hip: Secondary | ICD-10-CM

## 2016-09-06 DIAGNOSIS — M6281 Muscle weakness (generalized): Secondary | ICD-10-CM | POA: Diagnosis present

## 2016-09-06 DIAGNOSIS — R262 Difficulty in walking, not elsewhere classified: Secondary | ICD-10-CM | POA: Diagnosis not present

## 2016-09-06 NOTE — Therapy (Signed)
[place], [place], [address] Phone: [phone]   Fax:  [phone]  Physical Therapy Treatment  Patient Details  Name: Terri Small MRN: [id number] Date of Birth: 1931/03/16 Referring Provider: Dr. Ulyses Southward  Encounter Date: 09/06/2016      PT End of Session - 09/06/16 0802    Visit Number 5   Number of Visits 12   Date for PT Re-Evaluation 10/04/16   PT Start Time 0800   PT Stop Time 0856   PT Time Calculation (min) 56 min   Activity Tolerance Patient tolerated treatment well      Past Medical History:  Diagnosis Date  . Arthritis    "everywhere"  . Atrial fibrillation ([place])    s/p 2x ablation on chronic coumadin, previously on amiodarone but had pulmonary SE.  Marland Kitchen CHF (congestive heart failure) ([place])   . COPD (chronic obstructive pulmonary disease) [facility])    "pulmonologist told me last week my lungs are good" (07/10/2015)  . Diverticulitis   . Dysrhythmia   . Fracture of lateral malleolus 11/17/2013  . Fracture, fibula 11/26/2013  . Fracture, sternum closed 11/26/13  . GERD (gastroesophageal reflux disease)   . Headache   . Herpes zoster   . Osteoporosis   . Shortness of breath dyspnea     Past Surgical History:  Procedure Laterality Date  . ATRIAL FIBRILLATION ABLATION  ~ 2002; ~ 2004  . CARDIAC CATHETERIZATION  ~ 2002; ~ 2004  . COLONOSCOPY    . JOINT REPLACEMENT    . LAPAROSCOPIC CHOLECYSTECTOMY  ~ 2009  . TOTAL HIP ARTHROPLASTY Right 07/23/2016   Procedure: RIGHT TOTAL HIP ARTHROPLASTY ANTERIOR APPROACH;  Surgeon: Frederik Pear, MD;  Location: [facility];  Service: Orthopedics;  Laterality: Right;  . TOTAL SHOULDER ARTHROPLASTY Left 07/10/2015  . TOTAL SHOULDER ARTHROPLASTY Left 07/10/2015   Procedure: LEFT TOTAL SHOULDER ARTHROPLASTY;  Surgeon: Tania Ade, MD;  Location: [place];  Service: Orthopedics;  Laterality: Left;  Left shoulder arthroplasty  . VAGINAL HYSTERECTOMY       There were no vitals filed for this visit.      Subjective Assessment - 09/06/16 0803    Subjective Pt took some walks this weekend, about 1/3 of mile.  "I'm still wobbly when I walk; I'd like to work on that".       Currently in Pain? No/denies            [facility] PT Assessment - 09/06/16 0001      Assessment   Medical Diagnosis Rt THA   Referring Provider Dr. Ulyses Southward   Onset Date/Surgical Date 06/23/16   Hand Dominance Right   Next MD Visit Dec.      Precautions   Precautions Anterior Hip  anterior THA     Standardized Balance Assessment   Standardized Balance Assessment Timed Up and Go Test     Timed Up and Go Test   Normal TUG (seconds) 12            [facility] Adult PT Treatment/Exercise - 09/06/16 0001      Knee/Hip Exercises: Stretches   Passive Hamstring Stretch Right;Left;2 reps;20 seconds   Quad Stretch Right;2 reps;20 seconds  seated, foot under chair   Piriformis Stretch Right;Left;30 seconds;4 reps   Gastroc Stretch Right;Left;2 reps;20 seconds     Knee/Hip Exercises: Aerobic   Nustep L4: 6.5 min     Knee/Hip Exercises: Standing   Other Standing Knee Exercises tandem stance with horiz head  turns x 30 sec, 3 reps each side.   Toe taps to 3" cup on 3" step x 12 reps (without UE support)      Knee/Hip Exercises: Seated   Other Seated Knee/Hip Exercises resisted hip flex, PF with RLE (to simulate driving) x 20 rep with green band, repeated with blue band.    Other Seated Knee/Hip Exercises Rt knee flex/ext with foot on fitter (2 blue bands, then 1 blue band x 15 reps each)      Moist Heat Therapy   Number Minutes Moist Heat 15 Minutes   Moist Heat Location Hip  Rt     Electrical Stimulation   Electrical Stimulation Location Rt piriformis   Electrical Stimulation Action premod   Electrical Stimulation Parameters to tolerance    Electrical Stimulation Goals Pain                  PT Short Term Goals - 09/06/16 0848      PT SHORT  TERM GOAL #1   Title Pt will be independent in initial HEP   Period Weeks   Status Achieved     PT SHORT TERM GOAL #2   Title Pt will improve Rt hip strength to 3+/5 to improve functional mobility   Time 6   Period Weeks   Status On-going     PT SHORT TERM GOAL #3   Title Pt will improve TUG to <= 16.5 seconds to demo functional improvement   Time 6   Period Weeks   Status Achieved           PT Long Term Goals - 09/02/16 0935      PT LONG TERM GOAL #1   Title Pt will improve Rt LE strength to 4/5 to be able to stand longer with decreased symptoms   Time 6   Period Weeks   Status On-going     PT LONG TERM GOAL #2   Title Pt will perform gait without AD for household distances   Time 6   Period Weeks   Status Achieved     PT LONG TERM GOAL #3   Title Pt will improve 6 minute walk test to 750' to demo improved functional endurance   Time 6   Period Weeks   Status On-going     PT LONG TERM GOAL #4   Title Pt will improve FOTO to <= 39% to demo mproved functional mobility   Time 6   Period Weeks   Status On-going               Plan - 09/06/16 0845    Clinical Impression Statement Pt completed TUG in 12 sec; improvement from initial eval.  She has met STG #3.  She tolerated all exercises well, without increase in pain.  Pt later reported her Rt posterior hip has been nagging; trial of estim with MHP to reduce pain.     Rehab Potential Good   PT Frequency 2x / week   PT Duration 6 weeks   PT Treatment/Interventions Patient/family education;Therapeutic exercise;Therapeutic activities;Manual techniques;Neuromuscular re-education;Cryotherapy;Electrical Stimulation;Moist Heat;Ultrasound;DME Instruction;Gait training;Stair training;Functional mobility training;Balance training;Taping;Passive range of motion   PT Next Visit Plan manual therapy to Rt quad / post hip.   Cont progressive Rt hip strengthening.    Consulted and Agree with Plan of Care Patient       Patient will benefit from skilled therapeutic intervention in order to improve the following deficits and impairments:  Abnormal gait, Decreased activity tolerance,  Decreased strength, Pain, Difficulty walking, Decreased balance, Decreased endurance, Decreased mobility  Visit Diagnosis: Muscle weakness (generalized)  Pain of right hip joint  Difficulty in walking, not elsewhere classified     Problem List Patient Active Problem List   Diagnosis Date Noted  . Arthritis of right hip 07/23/2016  . Primary osteoarthritis of right hip 07/22/2016    Class: Chronic  . Skin lesion of left arm 03/22/2016  . Squamous cell skin cancer 03/22/2016  . Foot pain, bilateral 12/10/2015  . Glenohumeral arthritis 07/10/2015  . Upper airway cough syndrome 07/03/2015  . Fever blister 11/06/2014  . Chronic diastolic heart failure ([place]) 12/25/2013  . Cardiomyopathy ([place]) 12/21/2013  . Pulmonary fibrosis ([place]) 10/16/2013  . TR (tricuspid regurgitation) 10/16/2013  . Congestive heart failure with left ventricular diastolic dysfunction ([place]) 08/27/2013  . Secondary pulmonary hypertension 08/27/2013  . CAFL (chronic airflow limitation) ([place]) 08/16/2013  . Acid reflux 08/16/2013  . MI (mitral incompetence) 06/30/2011  . History of recurrent UTI (urinary tract infection) 04/21/2010  . Atrial fibrillation ([place]) 05/13/2009  . OSTEOPOROSIS 05/13/2009  . HERPES ZOSTER 02/28/2009   Kerin Perna, PTA 09/06/16 8:53 AM  [facility] [place], [place], [address] Phone: [phone]   Fax:  [phone]  Name: Philomena Buttermore MRN: [id number] Date of Birth: 1931-07-20

## 2016-09-08 ENCOUNTER — Ambulatory Visit (INDEPENDENT_AMBULATORY_CARE_PROVIDER_SITE_OTHER): Payer: Medicare Other | Admitting: Physical Therapy

## 2016-09-08 DIAGNOSIS — R262 Difficulty in walking, not elsewhere classified: Secondary | ICD-10-CM

## 2016-09-08 DIAGNOSIS — M6281 Muscle weakness (generalized): Secondary | ICD-10-CM

## 2016-09-08 DIAGNOSIS — M25551 Pain in right hip: Secondary | ICD-10-CM | POA: Diagnosis not present

## 2016-09-08 NOTE — Therapy (Signed)
[place], [place], [address] Phone: [phone]   Fax:  [phone]  Physical Therapy Treatment  Patient Details  Name: Terri Small MRN: [id number] Date of Birth: 21-Jun-1931 Referring Provider: Dr. Ulyses Southward  Encounter Date: 09/08/2016      PT End of Session - 09/08/16 0806    Visit Number 6   Number of Visits 12   Date for PT Re-Evaluation 10/04/16   PT Start Time 0804   PT Stop Time 3212   PT Time Calculation (min) 53 min   Activity Tolerance Patient tolerated treatment well      Past Medical History:  Diagnosis Date  . Arthritis    "everywhere"  . Atrial fibrillation ([place])    s/p 2x ablation on chronic coumadin, previously on amiodarone but had pulmonary SE.  Marland Kitchen CHF (congestive heart failure) ([place])   . COPD (chronic obstructive pulmonary disease) [facility])    "pulmonologist told me last week my lungs are good" (07/10/2015)  . Diverticulitis   . Dysrhythmia   . Fracture of lateral malleolus 11/17/2013  . Fracture, fibula 11/26/2013  . Fracture, sternum closed 11/26/13  . GERD (gastroesophageal reflux disease)   . Headache   . Herpes zoster   . Osteoporosis   . Shortness of breath dyspnea     Past Surgical History:  Procedure Laterality Date  . ATRIAL FIBRILLATION ABLATION  ~ 2002; ~ 2004  . CARDIAC CATHETERIZATION  ~ 2002; ~ 2004  . COLONOSCOPY    . JOINT REPLACEMENT    . LAPAROSCOPIC CHOLECYSTECTOMY  ~ 2009  . TOTAL HIP ARTHROPLASTY Right 07/23/2016   Procedure: RIGHT TOTAL HIP ARTHROPLASTY ANTERIOR APPROACH;  Surgeon: Frederik Pear, MD;  Location: [place];  Service: Orthopedics;  Laterality: Right;  . TOTAL SHOULDER ARTHROPLASTY Left 07/10/2015  . TOTAL SHOULDER ARTHROPLASTY Left 07/10/2015   Procedure: LEFT TOTAL SHOULDER ARTHROPLASTY;  Surgeon: Tania Ade, MD;  Location: [place];  Service: Orthopedics;  Laterality: Left;  Left shoulder arthroplasty  . VAGINAL HYSTERECTOMY       There were no vitals filed for this visit.      Subjective Assessment - 09/08/16 0806    Subjective "I had such a good day yesterday, I didn't even use the heating pad".  Pt reports she has some more pain this morning in Rt hip; attributes it to the way she slept last night.    Currently in Pain? Yes   Pain Score 4   took pain med prior to therapy   Pain Location Hip   Pain Orientation Right;Lateral;Anterior   Pain Descriptors / Indicators Aching   Aggravating Factors  prolonged walking   Pain Relieving Factors rest, heat, tylenol.            [facility] PT Assessment - 09/08/16 0001      Assessment   Medical Diagnosis Rt THA   Referring Provider Dr. Ulyses Southward   Onset Date/Surgical Date 06/23/16   Hand Dominance Right   Next MD Visit 09/29/12     Precautions   Precautions Anterior Hip  anterior THA     6 minute walk test results    Aerobic Endurance Distance Walked 2482  without AD.             [place] Adult PT Treatment/Exercise - 09/08/16 0001      Knee/Hip Exercises: Stretches   Hip Flexor Stretch Right;2 reps;30 seconds  Rt leg off table, foot supported to control stretch.   Piriformis Stretch  Right;Left;30 seconds;4 reps   Gastroc Stretch Right;Left;2 reps;30 seconds     Knee/Hip Exercises: Aerobic   Nustep L3: 4 min    Other Aerobic walking laps around gym. (6 min)     Knee/Hip Exercises: Standing   Other Standing Knee Exercises tandem stance with horiz head turns x 30 sec, 3 reps each side.   Warrior 2 - with small knee bends x 8 reps each side.      Moist Heat Therapy   Number Minutes Moist Heat 15 Minutes   Moist Heat Location Hip  Rt     Electrical Stimulation   Electrical Stimulation Location Rt ant/ post hip   Electrical Stimulation Action IFC   Electrical Stimulation Parameters to tolerance    Electrical Stimulation Goals Pain     Manual Therapy   Manual Therapy Myofascial release   Myofascial Release to Rt ant and lateral prox quad to  release fascial tightness.                   PT Short Term Goals - 09/06/16 0848      PT SHORT TERM GOAL #1   Title Pt will be independent in initial HEP   Period Weeks   Status Achieved     PT SHORT TERM GOAL #2   Title Pt will improve Rt hip strength to 3+/5 to improve functional mobility   Time 6   Period Weeks   Status On-going     PT SHORT TERM GOAL #3   Title Pt will improve TUG to <= 16.5 seconds to demo functional improvement   Time 6   Period Weeks   Status Achieved           PT Long Term Goals - 09/08/16 4097      PT LONG TERM GOAL #1   Title Pt will improve Rt LE strength to 4/5 to be able to stand longer with decreased symptoms   Time 6   Period Weeks   Status On-going     PT LONG TERM GOAL #2   Title Pt will perform gait without AD for household distances   Time 6   Period Weeks   Status Achieved     PT LONG TERM GOAL #3   Title Pt will improve 6 minute walk test to 750' to demo improved functional endurance   Time 6   Period Weeks   Status Achieved  walked 1040 ft without AD     PT LONG TERM GOAL #4   Title Pt will improve FOTO to <= 39% to demo improved functional mobility   Time 6   Period Weeks   Status On-going               Plan - 09/08/16 3532    Clinical Impression Statement Pt ambulated 1040 ft in 6 min walk test; has met LTG # 3.  She had fascial tightness and tenderness with manual therapy to Rt ant quad; reduced tenderness with estim/MHP at end of session.  Pt making great progress towards remaining goals.    Rehab Potential Good   PT Frequency 2x / week   PT Duration 6 weeks   PT Treatment/Interventions Patient/family education;Therapeutic exercise;Therapeutic activities;Manual techniques;Neuromuscular re-education;Cryotherapy;Electrical Stimulation;Moist Heat;Ultrasound;DME Instruction;Gait training;Stair training;Functional mobility training;Balance training;Taping;Passive range of motion   PT Next Visit Plan  manual therapy to Rt quad / post hip.   Cont progressive Rt hip strengthening.    Consulted and Agree with Plan of Care Patient  Patient will benefit from skilled therapeutic intervention in order to improve the following deficits and impairments:  Abnormal gait, Decreased activity tolerance, Decreased strength, Pain, Difficulty walking, Decreased balance, Decreased endurance, Decreased mobility  Visit Diagnosis: Muscle weakness (generalized)  Pain of right hip joint  Difficulty in walking, not elsewhere classified     Problem List Patient Active Problem List   Diagnosis Date Noted  . Arthritis of right hip 07/23/2016  . Primary osteoarthritis of right hip 07/22/2016    Class: Chronic  . Skin lesion of left arm 03/22/2016  . Squamous cell skin cancer 03/22/2016  . Foot pain, bilateral 12/10/2015  . Glenohumeral arthritis 07/10/2015  . Upper airway cough syndrome 07/03/2015  . Fever blister 11/06/2014  . Chronic diastolic heart failure ([place]) 12/25/2013  . Cardiomyopathy ([place]) 12/21/2013  . Pulmonary fibrosis ([place]) 10/16/2013  . TR (tricuspid regurgitation) 10/16/2013  . Congestive heart failure with left ventricular diastolic dysfunction ([place]) 08/27/2013  . Secondary pulmonary hypertension 08/27/2013  . CAFL (chronic airflow limitation) ([place]) 08/16/2013  . Acid reflux 08/16/2013  . MI (mitral incompetence) 06/30/2011  . History of recurrent UTI (urinary tract infection) 04/21/2010  . Atrial fibrillation ([place]) 05/13/2009  . OSTEOPOROSIS 05/13/2009  . HERPES ZOSTER 02/28/2009   Kerin Perna, PTA 09/08/16 9:06 AM  [place], [place], [address] Phone: [phone]   Fax:  [phone]  Name: Brooklyne Radke MRN: [id number] Date of Birth: Aug 03, 1931

## 2016-09-13 ENCOUNTER — Ambulatory Visit (INDEPENDENT_AMBULATORY_CARE_PROVIDER_SITE_OTHER): Payer: Medicare Other | Admitting: Physical Therapy

## 2016-09-13 DIAGNOSIS — M25551 Pain in right hip: Secondary | ICD-10-CM | POA: Diagnosis not present

## 2016-09-13 DIAGNOSIS — R262 Difficulty in walking, not elsewhere classified: Secondary | ICD-10-CM | POA: Diagnosis not present

## 2016-09-13 DIAGNOSIS — M6281 Muscle weakness (generalized): Secondary | ICD-10-CM

## 2016-09-13 NOTE — Therapy (Signed)
[place], [place], [address] Phone: [phone]   Fax:  [phone]  Physical Therapy Treatment  Patient Details  Name: Terri Small MRN: [id number] Date of Birth: February 08, 1931 Referring Provider: Dr. Ulyses Southward  Encounter Date: 09/13/2016      PT End of Session - 09/13/16 0807    Visit Number 7   Number of Visits 12   Date for PT Re-Evaluation 10/04/16   PT Start Time 0802   PT Stop Time 5498   PT Time Calculation (min) 55 min   Activity Tolerance Patient tolerated treatment well      Past Medical History:  Diagnosis Date  . Arthritis    "everywhere"  . Atrial fibrillation ([place])    s/p 2x ablation on chronic coumadin, previously on amiodarone but had pulmonary SE.  Marland Kitchen CHF (congestive heart failure) ([place])   . COPD (chronic obstructive pulmonary disease) [facility])    "pulmonologist told me last week my lungs are good" (07/10/2015)  . Diverticulitis   . Dysrhythmia   . Fracture of lateral malleolus 11/17/2013  . Fracture, fibula 11/26/2013  . Fracture, sternum closed 11/26/13  . GERD (gastroesophageal reflux disease)   . Headache   . Herpes zoster   . Osteoporosis   . Shortness of breath dyspnea     Past Surgical History:  Procedure Laterality Date  . ATRIAL FIBRILLATION ABLATION  ~ 2002; ~ 2004  . CARDIAC CATHETERIZATION  ~ 2002; ~ 2004  . COLONOSCOPY    . JOINT REPLACEMENT    . LAPAROSCOPIC CHOLECYSTECTOMY  ~ 2009  . TOTAL HIP ARTHROPLASTY Right 07/23/2016   Procedure: RIGHT TOTAL HIP ARTHROPLASTY ANTERIOR APPROACH;  Surgeon: Frederik Pear, MD;  Location: [place];  Service: Orthopedics;  Laterality: Right;  . TOTAL SHOULDER ARTHROPLASTY Left 07/10/2015  . TOTAL SHOULDER ARTHROPLASTY Left 07/10/2015   Procedure: LEFT TOTAL SHOULDER ARTHROPLASTY;  Surgeon: Tania Ade, MD;  Location: [place];  Service: Orthopedics;  Laterality: Left;  Left shoulder arthroplasty  . VAGINAL HYSTERECTOMY       There were no vitals filed for this visit.      Subjective Assessment - 09/13/16 0807    Subjective Pt is up to 1/2 mile walking in neighborhood.  She has been massage incision and Rt thigh to help with tightness.    Currently in Pain? Yes   Pain Score 2    Pain Location Hip   Pain Orientation Right;Anterior;Lateral   Pain Descriptors / Indicators Aching   Aggravating Factors  prolonged walking    Pain Relieving Factors rest, heat, tylenol             [facility] PT Assessment - 09/13/16 0001      Assessment   Medical Diagnosis Rt THA   Referring Provider Dr. Ulyses Southward   Onset Date/Surgical Date 06/23/16   Hand Dominance Right   Next MD Visit 09/28/16     Precautions   Precautions Anterior Hip  anterior THA     ROM / Strength   AROM / PROM / Strength Strength     Strength   Strength Assessment Site Hip   Right/Left Hip Right   Right Hip Flexion 4+/5  with discomfort.  difficult initiating.    Right Hip Extension 3+/5   Right Hip ABduction 3+/5          [facility] Adult PT Treatment/Exercise - 09/13/16 0001      Knee/Hip Exercises: Stretches   Passive Hamstring Stretch Right;Left;2 reps;30 seconds  Piriformis Stretch Right;Left;30 seconds;4 reps   Gastroc Stretch Right;Left;2 reps;30 seconds     Knee/Hip Exercises: Aerobic   Nustep L4: 5 min      Knee/Hip Exercises: Standing   Other Standing Knee Exercises on 1/2 foam roller (flat side down): side stepping, tandem walk.  (round side down): balance with 2 feet- occasional UE support x 45 sec x 2 reps.      Knee/Hip Exercises: Seated   Other Seated Knee/Hip Exercises resisted hip flex, PF with RLE (to simulate driving) x 20 rep with blue band.      Knee/Hip Exercises: Supine   Bridges Strengthening;1 set;10 reps  5 sec in ext   Straight Leg Raises Right;1 set;5 reps     Knee/Hip Exercises: Sidelying   Clams 2 sets of 10 on RLE     Moist Heat Therapy   Number Minutes Moist Heat 15 Minutes   Moist Heat  Location Hip  Rt     Electrical Stimulation   Electrical Stimulation Location Rt ant/ post hip   Electrical Stimulation Action IFC   Electrical Stimulation Parameters  to tolerance    Electrical Stimulation Goals Pain                  PT Short Term Goals - 09/13/16 0848      PT SHORT TERM GOAL #1   Title Pt will be independent in initial HEP   Time 3   Period Weeks   Status Achieved     PT SHORT TERM GOAL #2   Title Pt will improve Rt hip strength to 3+/5 to improve functional mobility   Time 6   Period Weeks   Status Achieved     PT SHORT TERM GOAL #3   Title Pt will improve TUG to <= 16.5 seconds to demo functional improvement   Time 6   Period Weeks   Status Achieved           PT Long Term Goals - 09/13/16 7322      PT LONG TERM GOAL #1   Title Pt will improve Rt LE strength to 4/5 to be able to stand longer with decreased symptoms   Time 6   Period Weeks   Status On-going     PT LONG TERM GOAL #2   Title Pt will perform gait without AD for household distances   Time 6   Period Weeks   Status Achieved     PT LONG TERM GOAL #3   Title Pt will improve 6 minute walk test to 750' to demo improved functional endurance   Time 6   Period Weeks   Status Achieved     PT LONG TERM GOAL #4   Title Pt will improve FOTO to <= 39% to demo improved functional mobility   Time 6   Period Weeks   Status On-going               Plan - 09/13/16 0254    Clinical Impression Statement Pt's strength in RLE is improving as is balance.  She had some difficulty initiating and completing Rt SLR; surprised her because this was an exercise that had become easy.  She continues with fascial tightness in Rt ant quad. Has met STG #2 and is progressing towards remaining goals.    Rehab Potential Good   PT Frequency 2x / week   PT Duration 6 weeks   PT Treatment/Interventions Patient/family education;Therapeutic exercise;Therapeutic activities;Manual  techniques;Neuromuscular re-education;Cryotherapy;Electrical Stimulation;Moist Heat;Ultrasound;DME Instruction;Gait  training;Stair training;Functional mobility training;Balance training;Taping;Passive range of motion   PT Next Visit Plan manual therapy to Rt quad / post hip.   Cont progressive Rt hip strengthening.    Consulted and Agree with Plan of Care Patient      Patient will benefit from skilled therapeutic intervention in order to improve the following deficits and impairments:  Abnormal gait, Decreased activity tolerance, Decreased strength, Pain, Difficulty walking, Decreased balance, Decreased endurance, Decreased mobility  Visit Diagnosis: Muscle weakness (generalized)  Pain of right hip joint  Difficulty in walking, not elsewhere classified     Problem List Patient Active Problem List   Diagnosis Date Noted  . Arthritis of right hip 07/23/2016  . Primary osteoarthritis of right hip 07/22/2016    Class: Chronic  . Skin lesion of left arm 03/22/2016  . Squamous cell skin cancer 03/22/2016  . Foot pain, bilateral 12/10/2015  . Glenohumeral arthritis 07/10/2015  . Upper airway cough syndrome 07/03/2015  . Fever blister 11/06/2014  . Chronic diastolic heart failure ([place]) 12/25/2013  . Cardiomyopathy ([place]) 12/21/2013  . Pulmonary fibrosis ([place]) 10/16/2013  . TR (tricuspid regurgitation) 10/16/2013  . Congestive heart failure with left ventricular diastolic dysfunction ([place]) 08/27/2013  . Secondary pulmonary hypertension 08/27/2013  . CAFL (chronic airflow limitation) ([place]) 08/16/2013  . Acid reflux 08/16/2013  . MI (mitral incompetence) 06/30/2011  . History of recurrent UTI (urinary tract infection) 04/21/2010  . Atrial fibrillation ([place]) 05/13/2009  . OSTEOPOROSIS 05/13/2009  . HERPES ZOSTER 02/28/2009   Kerin Perna, PTA 09/13/16 8:49 AM  [facility] [place], [place],  [address] Phone: [phone]   Fax:  [phone]  Name: Terri Small MRN: [id number] Date of Birth: 01/11/1931

## 2016-09-15 ENCOUNTER — Ambulatory Visit (INDEPENDENT_AMBULATORY_CARE_PROVIDER_SITE_OTHER): Payer: Medicare Other | Admitting: Physical Therapy

## 2016-09-15 DIAGNOSIS — M6281 Muscle weakness (generalized): Secondary | ICD-10-CM | POA: Diagnosis present

## 2016-09-15 DIAGNOSIS — R262 Difficulty in walking, not elsewhere classified: Secondary | ICD-10-CM

## 2016-09-15 DIAGNOSIS — M25551 Pain in right hip: Secondary | ICD-10-CM | POA: Diagnosis not present

## 2016-09-15 NOTE — Therapy (Signed)
[place], [place], [address] Phone: [phone]   Fax:  [phone]  Physical Therapy Treatment  Patient Details  Name: Terri Small MRN: [id number] Date of Birth: 1930-11-11 Referring Provider: Dr. Ulyses Southward  Encounter Date: 09/15/2016      PT End of Session - 09/15/16 1602    Visit Number 8   Number of Visits 12   Date for PT Re-Evaluation 10/04/16   PT Start Time [id number]   PT Stop Time 1651   PT Time Calculation (min) 53 min   Activity Tolerance Patient tolerated treatment well   Behavior During Therapy [facility] for tasks assessed/performed      Past Medical History:  Diagnosis Date  . Arthritis    "everywhere"  . Atrial fibrillation ([place])    s/p 2x ablation on chronic coumadin, previously on amiodarone but had pulmonary SE.  Marland Kitchen CHF (congestive heart failure) ([place])   . COPD (chronic obstructive pulmonary disease) [facility])    "pulmonologist told me last week my lungs are good" (07/10/2015)  . Diverticulitis   . Dysrhythmia   . Fracture of lateral malleolus 11/17/2013  . Fracture, fibula 11/26/2013  . Fracture, sternum closed 11/26/13  . GERD (gastroesophageal reflux disease)   . Headache   . Herpes zoster   . Osteoporosis   . Shortness of breath dyspnea     Past Surgical History:  Procedure Laterality Date  . ATRIAL FIBRILLATION ABLATION  ~ 2002; ~ 2004  . CARDIAC CATHETERIZATION  ~ 2002; ~ 2004  . COLONOSCOPY    . JOINT REPLACEMENT    . LAPAROSCOPIC CHOLECYSTECTOMY  ~ 2009  . TOTAL HIP ARTHROPLASTY Right 07/23/2016   Procedure: RIGHT TOTAL HIP ARTHROPLASTY ANTERIOR APPROACH;  Surgeon: Frederik Pear, MD;  Location: [place];  Service: Orthopedics;  Laterality: Right;  . TOTAL SHOULDER ARTHROPLASTY Left 07/10/2015  . TOTAL SHOULDER ARTHROPLASTY Left 07/10/2015   Procedure: LEFT TOTAL SHOULDER ARTHROPLASTY;  Surgeon: Tania Ade, MD;  Location: [place];  Service: Orthopedics;  Laterality: Left;  Left  shoulder arthroplasty  . VAGINAL HYSTERECTOMY      There were no vitals filed for this visit.      Subjective Assessment - 09/15/16 1603    Subjective " I already walked 1/2 mile and did my exercise".   She states she is now able to do SLR on RLE x 10 reps with some effort.     Currently in Pain? Yes   Pain Score 3    Pain Location Hip   Pain Orientation Right;Posterior   Pain Descriptors / Indicators Aching   Aggravating Factors  prolonged walking    Pain Relieving Factors rest, heat, tylenol             [facility] PT Assessment - 09/15/16 0001      Assessment   Medical Diagnosis Rt THA   Referring Provider Dr. Ulyses Southward   Onset Date/Surgical Date 06/23/16   Hand Dominance Right   Next MD Visit 09/28/16         [facility] Adult PT Treatment/Exercise - 09/15/16 0001      Knee/Hip Exercises: Stretches   Passive Hamstring Stretch Right;2 reps;10 seconds   Gastroc Stretch Right;Left;2 reps;30 seconds     Knee/Hip Exercises: Aerobic   Nustep L4: 6 min      Knee/Hip Exercises: Standing   Heel Raises Both;3 sets;10 reps   Functional Squat 3 sets;10 reps   Other Standing Knee Exercises heel-toe forward/backward walking with light  UE support x 15 ft x 4 reps      Moist Heat Therapy   Number Minutes Moist Heat 15 Minutes   Moist Heat Location Hip  Rt     Electrical Stimulation   Electrical Stimulation Location Rt ant/post hip   Electrical Stimulation Action IFC   Electrical Stimulation Parameters to tolerance   Electrical Stimulation Goals Pain     Manual Therapy   Manual Therapy Myofascial release;Soft tissue mobilization   Manual therapy comments Pt in Lt sidelying    Soft tissue mobilization TPR to Rt piriformis    Myofascial Release to Rt glute med/ max, prox Rt quad                  PT Short Term Goals - 09/13/16 0848      PT SHORT TERM GOAL #1   Title Pt will be independent in initial HEP   Time 3   Period Weeks   Status Achieved     PT SHORT  TERM GOAL #2   Title Pt will improve Rt hip strength to 3+/5 to improve functional mobility   Time 6   Period Weeks   Status Achieved     PT SHORT TERM GOAL #3   Title Pt will improve TUG to <= 16.5 seconds to demo functional improvement   Time 6   Period Weeks   Status Achieved           PT Long Term Goals - 09/13/16 [id number]      PT LONG TERM GOAL #1   Title Pt will improve Rt LE strength to 4/5 to be able to stand longer with decreased symptoms   Time 6   Period Weeks   Status On-going     PT LONG TERM GOAL #2   Title Pt will perform gait without AD for household distances   Time 6   Period Weeks   Status Achieved     PT LONG TERM GOAL #3   Title Pt will improve 6 minute walk test to 750' to demo improved functional endurance   Time 6   Period Weeks   Status Achieved     PT LONG TERM GOAL #4   Title Pt will improve FOTO to <= 39% to demo improved functional mobility   Time 6   Period Weeks   Status On-going               Plan - 09/15/16 1657    Clinical Impression Statement Pt continues to have complaints of posterior hip pain with walking.  Addressed this today with manual therapy; reported decreased tenderness and pain at end of session.  Her balance is improving well; no difficulty with tandem walking.  She is progressing well towards remaining goals.    Rehab Potential Good   PT Frequency 2x / week   PT Duration 6 weeks   PT Treatment/Interventions Patient/family education;Therapeutic exercise;Therapeutic activities;Manual techniques;Neuromuscular re-education;Cryotherapy;Electrical Stimulation;Moist Heat;Ultrasound;DME Instruction;Gait training;Stair training;Functional mobility training;Balance training;Taping;Passive range of motion   PT Next Visit Plan Assess response to manual therapy to hip.  Assess hip strength.    Consulted and Agree with Plan of Care Patient      Patient will benefit from skilled therapeutic intervention in order to improve  the following deficits and impairments:  Abnormal gait, Decreased activity tolerance, Decreased strength, Pain, Difficulty walking, Decreased balance, Decreased endurance, Decreased mobility  Visit Diagnosis: Muscle weakness (generalized)  Pain of right hip joint  Difficulty in walking, not  elsewhere classified     Problem List Patient Active Problem List   Diagnosis Date Noted  . Arthritis of right hip 07/23/2016  . Primary osteoarthritis of right hip 07/22/2016    Class: Chronic  . Skin lesion of left arm 03/22/2016  . Squamous cell skin cancer 03/22/2016  . Foot pain, bilateral 12/10/2015  . Glenohumeral arthritis 07/10/2015  . Upper airway cough syndrome 07/03/2015  . Fever blister 11/06/2014  . Chronic diastolic heart failure ([place]) 12/25/2013  . Cardiomyopathy ([place]) 12/21/2013  . Pulmonary fibrosis ([place]) 10/16/2013  . TR (tricuspid regurgitation) 10/16/2013  . Congestive heart failure with left ventricular diastolic dysfunction ([place]) 08/27/2013  . Secondary pulmonary hypertension 08/27/2013  . CAFL (chronic airflow limitation) ([place]) 08/16/2013  . Acid reflux 08/16/2013  . MI (mitral incompetence) 06/30/2011  . History of recurrent UTI (urinary tract infection) 04/21/2010  . Atrial fibrillation ([place]) 05/13/2009  . OSTEOPOROSIS 05/13/2009  . HERPES ZOSTER 02/28/2009   Kerin Perna, PTA 09/15/16 5:00 PM  [place], [place], [address] Phone: [phone]   Fax:  [phone]  Name: Keviana Balsam MRN: [id number] Date of Birth: October 22, 1930

## 2016-09-16 ENCOUNTER — Ambulatory Visit (INDEPENDENT_AMBULATORY_CARE_PROVIDER_SITE_OTHER): Payer: Medicare Other | Admitting: Family Medicine

## 2016-09-16 DIAGNOSIS — I4891 Unspecified atrial fibrillation: Secondary | ICD-10-CM

## 2016-09-16 LAB — POCT INR: INR: 3.3

## 2016-09-16 NOTE — Progress Notes (Signed)
Pt.notified

## 2016-09-20 ENCOUNTER — Ambulatory Visit (INDEPENDENT_AMBULATORY_CARE_PROVIDER_SITE_OTHER): Payer: Medicare Other | Admitting: Physical Therapy

## 2016-09-20 DIAGNOSIS — R262 Difficulty in walking, not elsewhere classified: Secondary | ICD-10-CM | POA: Diagnosis not present

## 2016-09-20 DIAGNOSIS — M6281 Muscle weakness (generalized): Secondary | ICD-10-CM

## 2016-09-20 DIAGNOSIS — M25551 Pain in right hip: Secondary | ICD-10-CM | POA: Diagnosis not present

## 2016-09-20 NOTE — Therapy (Signed)
[place], [place], [address] Phone: [phone]   Fax:  [phone]  Physical Therapy Treatment  Patient Details  Name: Terri Small MRN: [id number] Date of Birth: 1930/10/23 Referring Provider: Dr. Ulyses Southward  Encounter Date: 09/20/2016      PT End of Session - 09/20/16 1521    Visit Number 9   Number of Visits 12   Date for PT Re-Evaluation 10/04/16   PT Start Time 9678   PT Stop Time 9381   PT Time Calculation (min) 43 min   Activity Tolerance Patient tolerated treatment well;No increased pain   Behavior During Therapy WFL for tasks assessed/performed      Past Medical History:  Diagnosis Date  . Arthritis    "everywhere"  . Atrial fibrillation ([place])    s/p 2x ablation on chronic coumadin, previously on amiodarone but had pulmonary SE.  Marland Kitchen CHF (congestive heart failure) ([place])   . COPD (chronic obstructive pulmonary disease) [facility])    "pulmonologist told me last week my lungs are good" (07/10/2015)  . Diverticulitis   . Dysrhythmia   . Fracture of lateral malleolus 11/17/2013  . Fracture, fibula 11/26/2013  . Fracture, sternum closed 11/26/13  . GERD (gastroesophageal reflux disease)   . Headache   . Herpes zoster   . Osteoporosis   . Shortness of breath dyspnea     Past Surgical History:  Procedure Laterality Date  . ATRIAL FIBRILLATION ABLATION  ~ 2002; ~ 2004  . CARDIAC CATHETERIZATION  ~ 2002; ~ 2004  . COLONOSCOPY    . JOINT REPLACEMENT    . LAPAROSCOPIC CHOLECYSTECTOMY  ~ 2009  . TOTAL HIP ARTHROPLASTY Right 07/23/2016   Procedure: RIGHT TOTAL HIP ARTHROPLASTY ANTERIOR APPROACH;  Surgeon: Frederik Pear, MD;  Location: [facility];  Service: Orthopedics;  Laterality: Right;  . TOTAL SHOULDER ARTHROPLASTY Left 07/10/2015  . TOTAL SHOULDER ARTHROPLASTY Left 07/10/2015   Procedure: LEFT TOTAL SHOULDER ARTHROPLASTY;  Surgeon: Tania Ade, MD;  Location: [facility];  Service: Orthopedics;   Laterality: Left;  Left shoulder arthroplasty  . VAGINAL HYSTERECTOMY      There were no vitals filed for this visit.      Subjective Assessment - 09/20/16 1521    Subjective Terri Small reports she has changed her sleeping position and this has resolved her posterior hip pain.  She's typically achey in the morning, but the water aerobics usually resolves this feeling.   She is now driving since yesterday.     Currently in Pain? No/denies   Pain Score 0-No pain            [facility] PT Assessment - 09/20/16 0001      Assessment   Medical Diagnosis Rt THA   Referring Provider Dr. Ulyses Southward   Onset Date/Surgical Date 06/23/16   Hand Dominance Right   Next MD Visit 09/28/16     Observation/Other Assessments   Focus on Therapeutic Outcomes (FOTO)  27% limited ( 49% at intake, goal of =/< 39%)      Strength   Right/Left Hip Right   Right Hip Flexion --  5-/5, difficult initiating.    Right Hip Extension 4+/5   Right Hip ABduction 4+/5   Right Hip ADduction --           [facility] Adult PT Treatment/Exercise - 09/20/16 0001      Knee/Hip Exercises: Stretches   Passive Hamstring Stretch Right;2 reps;30 seconds   Hip Flexor Stretch Right;Left;1 rep;20 seconds  standing   Gastroc Stretch Right;Left;30 seconds;3 reps     Knee/Hip Exercises: Aerobic   Nustep L4: 6.5 min      Knee/Hip Exercises: Standing   Heel Raises Both;10 reps;3 sets   Hip Abduction Right;Left;1 set;10 reps;Knee straight   Hip Extension Right;Left;1 set;10 reps   Other Standing Knee Exercises marching on 3" foam with no UE support.; repeated with eyes closed and close SBA for safety.  Tandem stance on 3" foam x 20 sec.     Other Standing Knee Exercises Toe taps to 6" step without UE support x 12 reps each leg.      Knee/Hip Exercises: Supine   Bridges with Clamshell Strengthening;Both;10 reps  3 clams each bridge   Other Supine Knee/Hip Exercises Rt hip flexion isometrics 3 sec hold x 10 reps                    PT Short Term Goals - 09/13/16 0848      PT SHORT TERM GOAL #1   Title Pt will be independent in initial HEP   Time 3   Period Weeks   Status Achieved     PT SHORT TERM GOAL #2   Title Pt will improve Rt hip strength to 3+/5 to improve functional mobility   Time 6   Period Weeks   Status Achieved     PT SHORT TERM GOAL #3   Title Pt will improve TUG to <= 16.5 seconds to demo functional improvement   Time 6   Period Weeks   Status Achieved           PT Long Term Goals - 09/20/16 1530      PT LONG TERM GOAL #1   Title Pt will improve Rt LE strength to 4/5 to be able to stand longer with decreased symptoms   Time 6   Period Weeks   Status Achieved     PT LONG TERM GOAL #2   Title Pt will perform gait without AD for household distances   Time 6   Period Weeks   Status Achieved     PT LONG TERM GOAL #3   Title Pt will improve 6 minute walk test to 750' to demo improved functional endurance   Time 6   Period Weeks   Status Achieved     PT LONG TERM GOAL #4   Title Pt will improve FOTO to <= 39% to demo improved functional mobility   Time 6   Period Weeks   Status Achieved               Plan - 09/20/16 1658    Clinical Impression Statement Pt tolerated all exercises well without any production of symptoms.  Pt has met all goals and requests to d/c to HEP at this time.     Rehab Potential Good   PT Frequency 2x / week   PT Duration 6 weeks   PT Treatment/Interventions Patient/family education;Therapeutic exercise;Therapeutic activities;Manual techniques;Neuromuscular re-education;Cryotherapy;Electrical Stimulation;Moist Heat;Ultrasound;DME Instruction;Gait training;Stair training;Functional mobility training;Balance training;Taping;Passive range of motion   PT Next Visit Plan Spoke to supervising PT; will d/c to HEP.    Consulted and Agree with Plan of Care Patient      Patient will benefit from skilled therapeutic  intervention in order to improve the following deficits and impairments:  Abnormal gait, Decreased activity tolerance, Decreased strength, Pain, Difficulty walking, Decreased balance, Decreased endurance, Decreased mobility  Visit Diagnosis: Muscle weakness (generalized)  Pain of right  hip joint  Difficulty in walking, not elsewhere classified     Problem List Patient Active Problem List   Diagnosis Date Noted  . Arthritis of right hip 07/23/2016  . Primary osteoarthritis of right hip 07/22/2016    Class: Chronic  . Skin lesion of left arm 03/22/2016  . Squamous cell skin cancer 03/22/2016  . Foot pain, bilateral 12/10/2015  . Glenohumeral arthritis 07/10/2015  . Upper airway cough syndrome 07/03/2015  . Fever blister 11/06/2014  . Chronic diastolic heart failure ([place]) 12/25/2013  . Cardiomyopathy ([place]) 12/21/2013  . Pulmonary fibrosis ([place]) 10/16/2013  . TR (tricuspid regurgitation) 10/16/2013  . Congestive heart failure with left ventricular diastolic dysfunction ([place]) 08/27/2013  . Secondary pulmonary hypertension 08/27/2013  . CAFL (chronic airflow limitation) ([place]) 08/16/2013  . Acid reflux 08/16/2013  . MI (mitral incompetence) 06/30/2011  . History of recurrent UTI (urinary tract infection) 04/21/2010  . Atrial fibrillation ([place]) 05/13/2009  . OSTEOPOROSIS 05/13/2009  . HERPES ZOSTER 02/28/2009   Kerin Perna, PTA 09/20/16 6:05 PM  [place], [place], [address] Phone: [phone]   Fax:  [phone]  Name: Terri Small MRN: [id number] Date of Birth: 1931/02/13   PHYSICAL THERAPY DISCHARGE SUMMARY  Visits from Start of Care: 9  Current functional level related to goals / functional outcomes: As noted above.   Remaining deficits: As noted above.   Education / Equipment: As noted above. Plan: Patient agrees to discharge.  Patient goals were met. Patient is being  discharged due to meeting the stated rehab goals.  ?????    Cassell Clement, PT, CSCS

## 2016-09-24 ENCOUNTER — Encounter: Payer: Medicare Other | Admitting: Physical Therapy

## 2016-09-27 ENCOUNTER — Encounter: Payer: Medicare Other | Admitting: Rehabilitative and Restorative Service Providers"

## 2016-09-29 ENCOUNTER — Ambulatory Visit (INDEPENDENT_AMBULATORY_CARE_PROVIDER_SITE_OTHER): Payer: Medicare Other | Admitting: Family Medicine

## 2016-09-29 DIAGNOSIS — I4891 Unspecified atrial fibrillation: Secondary | ICD-10-CM

## 2016-09-29 LAB — POCT INR: INR: 3.1

## 2016-09-29 NOTE — Progress Notes (Signed)
Left Rx dosage change on VM, callback provided for any questions and to schedule.

## 2016-09-30 ENCOUNTER — Ambulatory Visit: Payer: Medicare Other

## 2016-10-14 ENCOUNTER — Ambulatory Visit: Payer: Medicare Other

## 2016-10-20 ENCOUNTER — Ambulatory Visit (INDEPENDENT_AMBULATORY_CARE_PROVIDER_SITE_OTHER): Payer: Medicare Other | Admitting: Family Medicine

## 2016-10-20 VITALS — BP 119/70 | HR 76 | Wt 153.0 lb

## 2016-10-20 DIAGNOSIS — I4891 Unspecified atrial fibrillation: Secondary | ICD-10-CM

## 2016-10-20 LAB — POCT INR: INR: 2

## 2016-10-21 NOTE — Progress Notes (Signed)
Pt advised of recommendations, verbalized understanding. Will call clinic to schedule follow up.

## 2016-10-21 NOTE — Progress Notes (Addendum)
Called Pt regarding INR visit from yesterday, this was completed by a different staff member. Pt reports she has been taking half a tab on Tue and Thur, and Sat then 5 mg all other days. Will route back to PCP for review if this regime is approved.    Ok, corrected anticoagulation flowsheet. She was still continue current regimen and recheck level in 2 weeks. Beatrice Lecher, MD

## 2016-10-24 IMAGING — CR DG CHEST 2V
2 series · 2 of 2 positions shown · non-contrast
Comparison: 10/24/2014.

CLINICAL DATA: Cough.  Initial evaluation .

EXAM:
CHEST  2 VIEW

[view not recorded (1 of 2)]
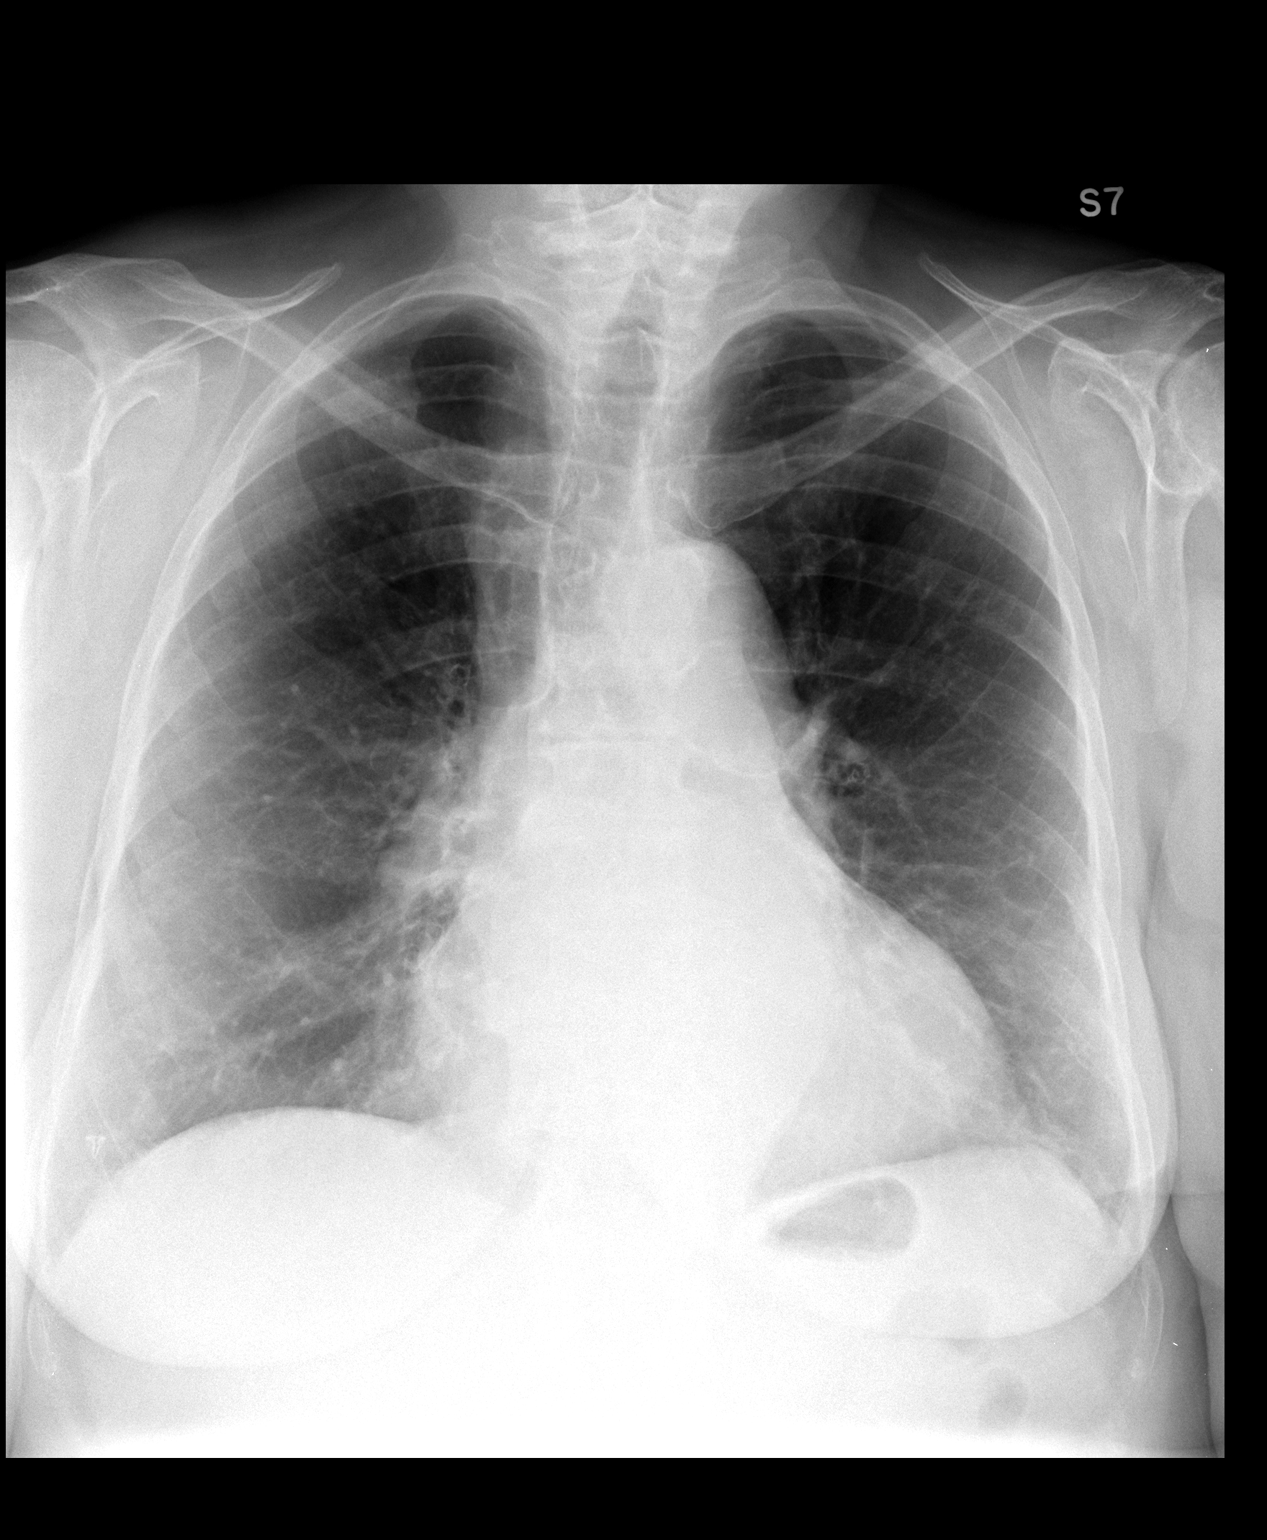

[view not recorded (2 of 2)]
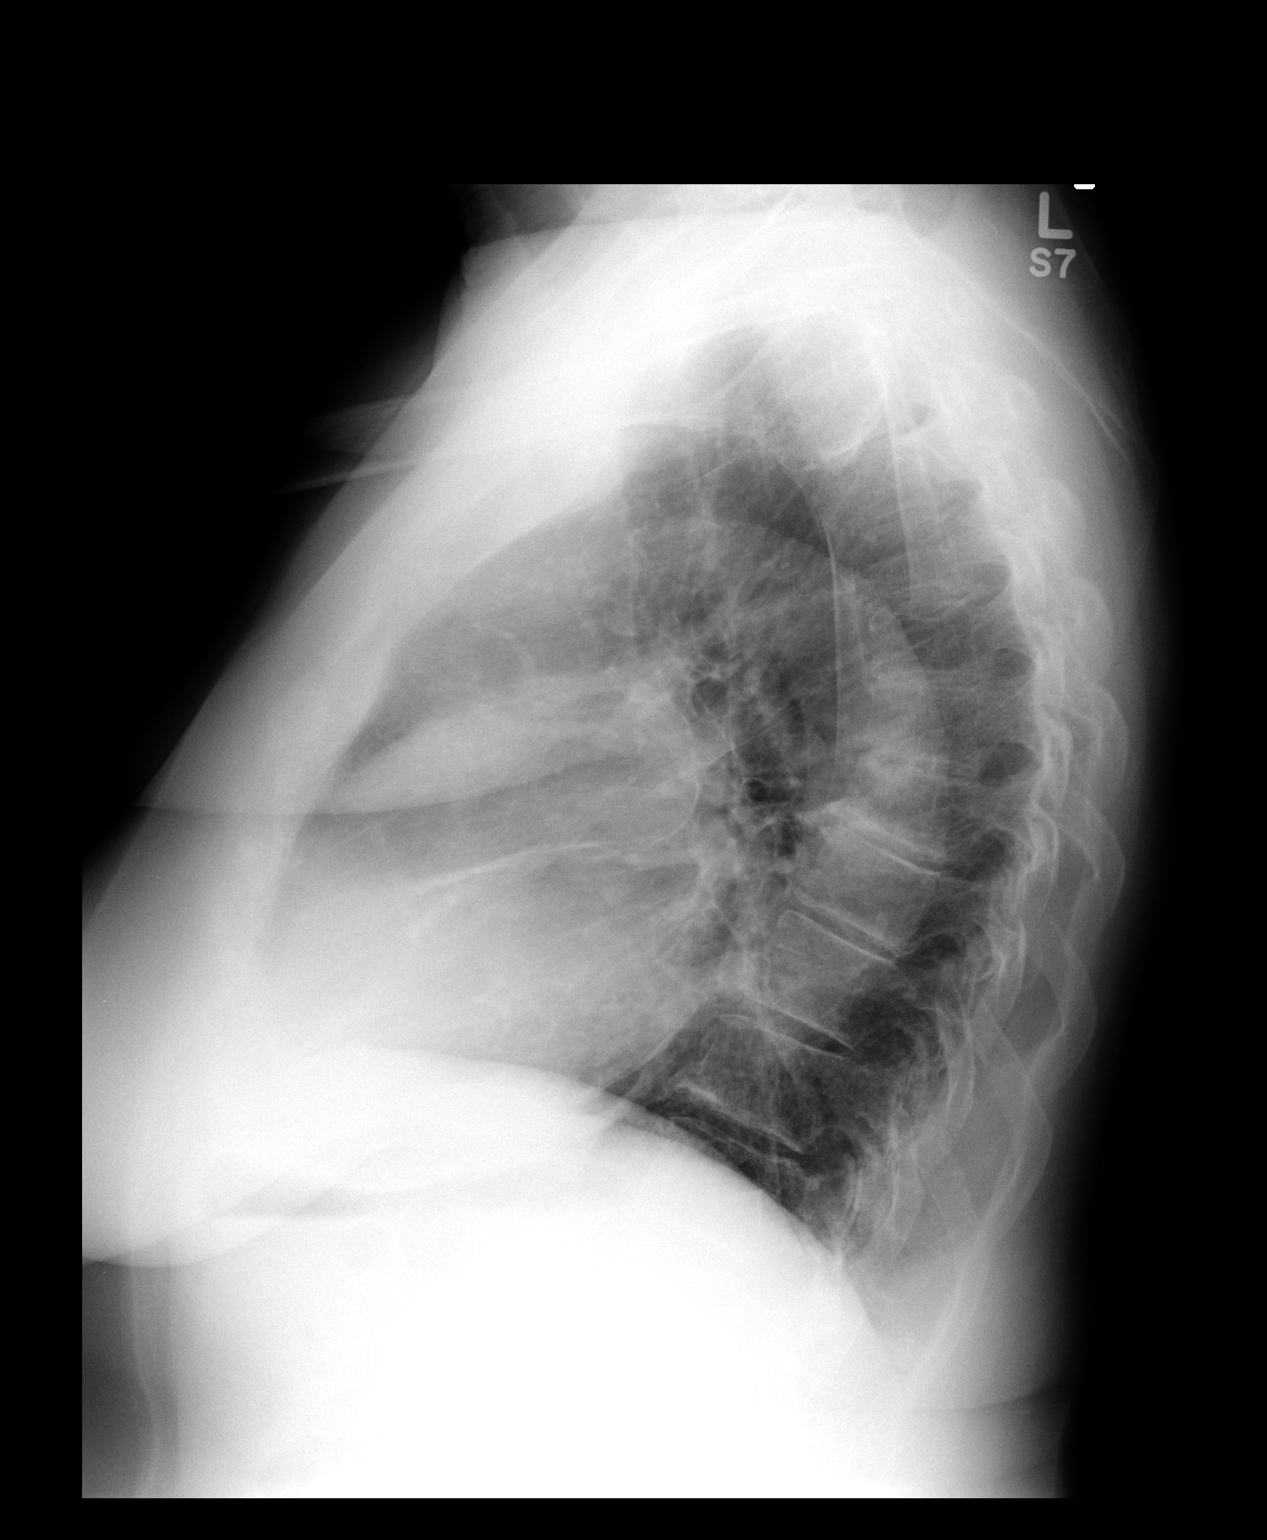

[2 of 2 positions shown; findings below may reference images not displayed]

FINDINGS: Mediastinum hilar structures normal. Cardiomegaly with normal
pulmonary vascularity. Basal pleural parenchymal thickening
consistent scarring. No focal infiltrate. No pleural effusion or
pneumothorax. Old right anterior healed rib fractures. Degenerative
changes thoracic spine.
IMPRESSION: Basal pleural-parenchymal thickening consistent with scarring. No
acute cardiopulmonary disease. Stable cardiomegaly.

## 2016-10-29 ENCOUNTER — Other Ambulatory Visit: Payer: Self-pay | Admitting: Family Medicine

## 2016-10-31 IMAGING — CR DG SHOULDER 1V*L*
1 series · 1 of 1 positions shown · non-contrast
Comparison: Exam at 9093 hr compared to CT shoulder exam of
06/24/2015

CLINICAL DATA: Post LEFT total shoulder arthroplasty

EXAM:
LEFT SHOULDER - 1 VIEW

[AP]
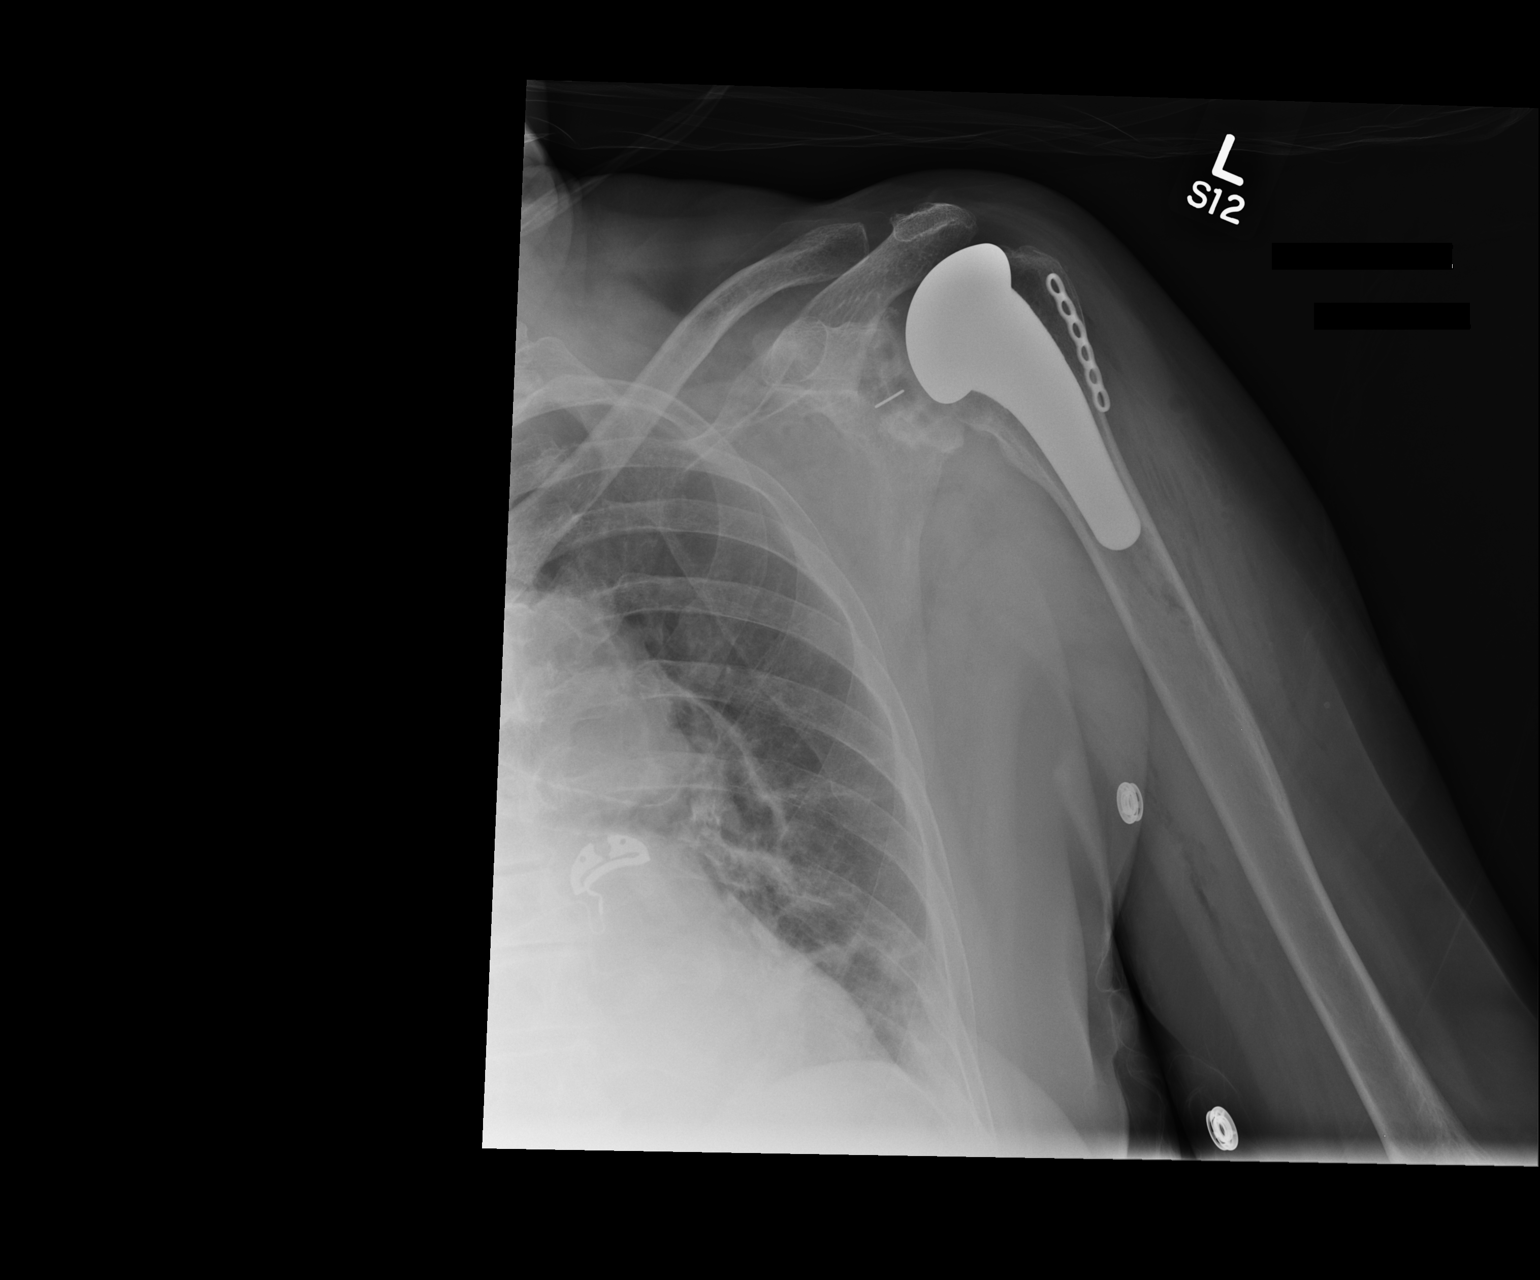

[1 of 1 positions shown; findings below may reference images not displayed]

FINDINGS: LEFT shoulder prosthesis identified.

Bones demineralized.

No acute fracture or dislocation.

AC joint alignment normal.
IMPRESSION: LEFT shoulder prosthesis without definite acute complication.

## 2016-11-04 ENCOUNTER — Ambulatory Visit: Payer: Medicare Other

## 2016-11-09 ENCOUNTER — Ambulatory Visit (INDEPENDENT_AMBULATORY_CARE_PROVIDER_SITE_OTHER): Payer: Medicare Other | Admitting: Family Medicine

## 2016-11-09 DIAGNOSIS — I4891 Unspecified atrial fibrillation: Secondary | ICD-10-CM

## 2016-11-09 LAB — POCT INR: INR: 1.7

## 2016-11-09 NOTE — Progress Notes (Signed)
Pt advised, follow up scheduled.  

## 2016-11-15 ENCOUNTER — Ambulatory Visit (INDEPENDENT_AMBULATORY_CARE_PROVIDER_SITE_OTHER): Payer: Medicare Other | Admitting: Family Medicine

## 2016-11-15 ENCOUNTER — Encounter: Payer: Self-pay | Admitting: Family Medicine

## 2016-11-15 VITALS — BP 112/67 | HR 73 | Wt 151.0 lb

## 2016-11-15 DIAGNOSIS — J029 Acute pharyngitis, unspecified: Secondary | ICD-10-CM | POA: Diagnosis not present

## 2016-11-15 NOTE — Progress Notes (Signed)
Terri Small is a 83 y.o. female who presents to [facility] today for sore throat.  She has had sore throat for the past 3 days associated with fatigue and loose stools. Sore throat did not improve with Tylenol; has not tried any other medications. Stools are nonbloody and are softer in consistency but not quite diarrhea. Denies fevers/chills, nasal congestion, cough, shortness of breath, chest pain, worsening leg swelling, pain anywhere else, nausea, or vomiting. She has had a normal appetite. No known sick contacts. Has gotten flu shot.   Past Medical History:  Diagnosis Date  . Arthritis    "everywhere"  . Atrial fibrillation ([place])    s/p 2x ablation on chronic coumadin, previously on amiodarone but had pulmonary SE.  Marland Kitchen CHF (congestive heart failure) ([place])   . COPD (chronic obstructive pulmonary disease) [facility])    "pulmonologist told me last week my lungs are good" (07/10/2015)  . Diverticulitis   . Dysrhythmia   . Fracture of lateral malleolus 11/17/2013  . Fracture, fibula 11/26/2013  . Fracture, sternum closed 11/26/13  . GERD (gastroesophageal reflux disease)   . Headache   . Herpes zoster   . Osteoporosis   . Shortness of breath dyspnea    Past Surgical History:  Procedure Laterality Date  . ATRIAL FIBRILLATION ABLATION  ~ 2002; ~ 2004  . CARDIAC CATHETERIZATION  ~ 2002; ~ 2004  . COLONOSCOPY    . JOINT REPLACEMENT    . LAPAROSCOPIC CHOLECYSTECTOMY  ~ 2009  . TOTAL HIP ARTHROPLASTY Right 07/23/2016   Procedure: RIGHT TOTAL HIP ARTHROPLASTY ANTERIOR APPROACH;  Surgeon: Frederik Pear, MD;  Location: [facility];  Service: Orthopedics;  Laterality: Right;  . TOTAL SHOULDER ARTHROPLASTY Left 07/10/2015  . TOTAL SHOULDER ARTHROPLASTY Left 07/10/2015   Procedure: LEFT TOTAL SHOULDER ARTHROPLASTY;  Surgeon: Tania Ade, MD;  Location: [facility];  Service: Orthopedics;   Laterality: Left;  Left shoulder arthroplasty  . VAGINAL HYSTERECTOMY     Social History  Substance Use Topics  . Smoking status: Former Smoker    Packs/day: 1.00    Years: 6.00    Types: Cigarettes    Quit date: 10/18/1960  . Smokeless tobacco: Never Used  . Alcohol use 2.4 oz/week    2 Glasses of wine, 2 Standard drinks or equivalent per week   family history includes Emphysema in her father; Stroke in her father.  ROS as above:  Medications: Current Outpatient Prescriptions  Medication Sig Dispense Refill  . diltiazem (CARDIZEM CD) 120 MG 24 hr capsule Take 120 mg by mouth daily.    . furosemide (LASIX) 20 MG tablet Take 1 tablet (20 mg total) by mouth daily. Take only when needed for swelling. 30 tablet 2  . meloxicam (MOBIC) 7.5 MG tablet TAKE 1 TABLET (7.5 MG TOTAL) BY MOUTH DAILY. 30 tablet 0  . metoprolol succinate (TOPROL-XL) 25 MG 24 hr tablet Take 25 mg by mouth every evening.  3  . MISC NATURAL PRODUCTS PO Take by mouth. Uricalm    . Multiple Vitamins-Minerals (PRESERVISION AREDS 2 PO) Take 1 tablet by mouth 2 (two) times daily.    Marland Kitchen PREVIDENT 5000 SENSITIVE 1.1-5 % PSTE Take 1 application by mouth at bedtime.   6  . warfarin (COUMADIN) 5 MG tablet TAKE 1 TABLET BY MOUTH EVERY DAY (Patient taking differently: Take 5mg s daily except on saturdays take 2.5mg ) 90 tablet 2   No current facility-administered medications for this visit.  Allergies  Allergen Reactions  . Nexium [Esomeprazole Magnesium] Other (See Comments)    Chest heaviness.  . Sulfonamide Derivatives Other (See Comments)    REACTION: rash- burning on face  . Nitrofurantoin Other (See Comments)    Pulmonary fibrosis     Health Maintenance Health Maintenance  Topic Date Due  . COLONOSCOPY  06/05/2017  . TETANUS/TDAP  02/25/2019  . INFLUENZA VACCINE  Completed  . DEXA SCAN  Completed  . ZOSTAVAX  Completed  . PNA vac Low Risk Adult  Completed     Exam:  BP 112/67   Pulse 73   Wt 151 lb  (68.5 kg)   BMI 26.75 kg/m  Gen: Well NAD HEENT: EOMI, conjunctiva clear, MMM, oropharynx clear without edema or exudates, tympanic membranes normal-appearing bilaterally, frontal and maxillary sinuses nontender to palpation Lungs: Normal work of breathing. CTABL Heart: irregular rate Abd: NABS, Soft. Nondistended, Nontender Exts: Brisk capillary refill, warm and well perfused.    No results found for this or any previous visit (from the past 72 hour(s)). No results found.    Assessment and Plan: 83 y.o. female with sore throat and fatigue for 3 days, likely viral etiology, not improved with Tylenol.. Her vitals are stable and normal, and she has no indications of worsening CHF or COPD. Recommended symptomatic treatment with lozenges and chloraseptic nasal spray.   No orders of the defined types were placed in this encounter.   Discussed warning signs or symptoms. Please see discharge instructions. Patient expresses understanding.

## 2016-11-15 NOTE — Patient Instructions (Addendum)
Thank you for coming in today.  Use over-the-counter lozenges. Continue staying hydrated. Come back you get worse or have fevers/chills, chest pain, increased shortness of breath, or worsening cough.  Call or go to the emergency room if you get worse, have trouble breathing, have chest pains, or palpitations.    Pharyngitis Pharyngitis is a sore throat (pharynx). There is redness, pain, and swelling of your throat. Follow these instructions at home:  Drink enough fluids to keep your pee (urine) clear or pale yellow.  Only take medicine as told by your doctor.  You may get sick again if you do not take medicine as told. Finish your medicines, even if you start to feel better.  Do not take aspirin.  Rest.  Rinse your mouth (gargle) with salt water ( tsp of salt per 1 qt of water) every 1-2 hours. This will help the pain.  If you are not at risk for choking, you can suck on hard candy or sore throat lozenges. Contact a doctor if:  You have large, tender lumps on your neck.  You have a rash.  You cough up green, yellow-brown, or bloody spit. Get help right away if:  You have a stiff neck.  You drool or cannot swallow liquids.  You throw up (vomit) or are not able to keep medicine or liquids down.  You have very bad pain that does not go away with medicine.  You have problems breathing (not from a stuffy nose). This information is not intended to replace advice given to you by your health care provider. Make sure you discuss any questions you have with your health care provider. Document Released: 03/22/2008 Document Revised: 03/11/2016 Document Reviewed: 06/11/2013 Elsevier Interactive Patient Education  2017 [employer].

## 2016-11-23 ENCOUNTER — Ambulatory Visit (INDEPENDENT_AMBULATORY_CARE_PROVIDER_SITE_OTHER): Payer: Medicare Other | Admitting: Family Medicine

## 2016-11-23 DIAGNOSIS — I4891 Unspecified atrial fibrillation: Secondary | ICD-10-CM | POA: Diagnosis not present

## 2016-11-23 LAB — POCT INR: INR: 2.4

## 2016-11-24 NOTE — Progress Notes (Signed)
Pt advised of recommendation, will call clinic to schedule.

## 2016-11-29 ENCOUNTER — Ambulatory Visit (INDEPENDENT_AMBULATORY_CARE_PROVIDER_SITE_OTHER): Payer: Medicare Other | Admitting: Family Medicine

## 2016-11-29 ENCOUNTER — Encounter: Payer: Self-pay | Admitting: Family Medicine

## 2016-11-29 VITALS — BP 122/77 | HR 97 | Temp 97.7°F | Ht 63.0 in | Wt 150.0 lb

## 2016-11-29 DIAGNOSIS — J069 Acute upper respiratory infection, unspecified: Secondary | ICD-10-CM

## 2016-11-29 DIAGNOSIS — B9789 Other viral agents as the cause of diseases classified elsewhere: Secondary | ICD-10-CM | POA: Diagnosis not present

## 2016-11-29 NOTE — Progress Notes (Signed)
   Subjective:    Patient ID: Terri Small, female    DOB: [birth year], 83 y.o.   MRN: [id number]  HPI 83 yo female comes in today complaining of ST x 2 weeks. She was seen in our office about 2 weeks ago for this.  She says it is not better.  Using salt water garlges.  Coughing up dark colored mucous.   She does feel like she had a fever initially but not recently. In fact the last 2 days she's actually felt much better. The cough is better the congestion is better and the sore throat is better.   Review of Systems     Objective:   Physical Exam  Constitutional: She is oriented to person, place, and time. She appears well-developed and well-nourished.  HENT:  Head: Normocephalic and atraumatic.  Right Ear: External ear normal.  Left Ear: External ear normal.  Nose: Nose normal.  Mouth/Throat: Oropharynx is clear and moist.  TMs and canals are clear.   Eyes: Conjunctivae and EOM are normal. Pupils are equal, round, and reactive to light.  Neck: Neck supple. No thyromegaly present.  Cardiovascular: Normal rate, regular rhythm and normal heart sounds.   Pulmonary/Chest: Effort normal and breath sounds normal. She has no wheezes.  Lymphadenopathy:    She has no cervical adenopathy.  Neurological: She is alert and oriented to person, place, and time.  Skin: Skin is warm and dry.  Psychiatric: She has a normal mood and affect.        Assessment & Plan:  URI - likely viral and feeling much better. If she continues to notice any brown nasal discharge in the next couple days and give [place] a call back and I'll treat her for acute sinusitis but I really think she is on the mend.

## 2016-12-16 ENCOUNTER — Ambulatory Visit (INDEPENDENT_AMBULATORY_CARE_PROVIDER_SITE_OTHER): Payer: Medicare Other | Admitting: Family Medicine

## 2016-12-16 DIAGNOSIS — I4891 Unspecified atrial fibrillation: Secondary | ICD-10-CM

## 2016-12-16 LAB — POCT INR: INR: 2.1

## 2016-12-20 ENCOUNTER — Telehealth: Payer: Self-pay | Admitting: Emergency Medicine

## 2016-12-20 NOTE — Telephone Encounter (Signed)
Daughter of patient called to report mother saying her left hand was tingling and sore during last 30 minutes; denies slurred speech, weakness, difficulty processing thoughts, muscular imbalance. Spoke with Dr.Metheney who suggested ok to watch for another 30-60 minutes and if it worsened or did not clear to seek evaluation in UC; or go to ED if CVA suspected. Daughter understands.

## 2016-12-23 NOTE — Progress Notes (Signed)
Pt informed. Pt understood and was agreeable.

## 2017-01-13 ENCOUNTER — Ambulatory Visit (INDEPENDENT_AMBULATORY_CARE_PROVIDER_SITE_OTHER): Payer: Medicare Other | Admitting: Family Medicine

## 2017-01-13 DIAGNOSIS — I4891 Unspecified atrial fibrillation: Secondary | ICD-10-CM | POA: Diagnosis not present

## 2017-01-13 LAB — POCT INR: INR: 2.2

## 2017-02-04 ENCOUNTER — Encounter: Payer: Self-pay | Admitting: Family Medicine

## 2017-02-04 ENCOUNTER — Ambulatory Visit (INDEPENDENT_AMBULATORY_CARE_PROVIDER_SITE_OTHER): Payer: Medicare Other

## 2017-02-04 ENCOUNTER — Ambulatory Visit (INDEPENDENT_AMBULATORY_CARE_PROVIDER_SITE_OTHER): Payer: Medicare Other | Admitting: Family Medicine

## 2017-02-04 VITALS — BP 130/73 | HR 72 | Temp 97.6°F | Wt 148.0 lb

## 2017-02-04 DIAGNOSIS — M25561 Pain in right knee: Secondary | ICD-10-CM

## 2017-02-04 DIAGNOSIS — M79642 Pain in left hand: Secondary | ICD-10-CM

## 2017-02-04 DIAGNOSIS — I4891 Unspecified atrial fibrillation: Secondary | ICD-10-CM | POA: Diagnosis not present

## 2017-02-04 LAB — POCT INR: INR: 2

## 2017-02-04 MED ORDER — QUETIAPINE FUMARATE 50 MG PO TABS: ORAL_TABLET | ORAL | 0 refills | Status: DC

## 2017-02-04 NOTE — Progress Notes (Signed)
Subjective:    Patient ID: Terri Small, female    DOB: 06-18-1931, 83 y.o.   MRN: [id number]  HPI  83 year old female comes in today for recent hospital follow-up status post fall on April 13, approximately 7 days ago area.. She has a history of atrial fibrillation and COPD. Unfortunately she just tripped on the curb. It was a witnessed fall. She did not lose consciousness. Unfortunately D she did fall and injure her left wrist, bilateral knees and lacerated her chin area. She did receive treatment with sutures in the emergency department. She had a negative CT of the head and negative x-ray of the finger that she injured.  She is doing better overall. They did use Dermabond on the chin laceration. She says in the last couple days she's noticed a little bit of pinkish tan just fluid on her pillowcase in the morning and noticed that has drained a little bit. Still very sore to touch but it has not gotten worse as far as pain concern. No fever she has had problems in the past with hypertrophic scarring.  She has significant bruising over her right lower leg and right knee but says it's actually getting a lot better and the swelling has gone down.  She still has a fair amount of pain and bruising position on the dorsum of the left hand proximally. Just proximal to the fifth digit.  Review of Systems  BP 130/73   Pulse 72   Temp 97.6 F (36.4 C) (Oral)   Wt 148 lb (67.1 kg)   BMI 26.22 kg/m     Allergies  Allergen Reactions  . Nexium [Esomeprazole Magnesium] Other (See Comments)    Chest heaviness.  . Sulfonamide Derivatives Other (See Comments)    REACTION: rash- burning on face  . Nitrofurantoin Other (See Comments)    Pulmonary fibrosis     Past Medical History:  Diagnosis Date  . Arthritis    "everywhere"  . Atrial fibrillation ([place])    s/p 2x ablation on chronic coumadin, previously on amiodarone but had pulmonary SE.  Marland Kitchen CHF (congestive heart failure) ([place])   . COPD  (chronic obstructive pulmonary disease) [facility])    "pulmonologist told me last week my lungs are good" (07/10/2015)  . Diverticulitis   . Dysrhythmia   . Fracture of lateral malleolus 11/17/2013  . Fracture, fibula 11/26/2013  . Fracture, sternum closed 11/26/13  . GERD (gastroesophageal reflux disease)   . Headache   . Herpes zoster   . Osteoporosis   . Shortness of breath dyspnea     Past Surgical History:  Procedure Laterality Date  . ATRIAL FIBRILLATION ABLATION  ~ 2002; ~ 2004  . CARDIAC CATHETERIZATION  ~ 2002; ~ 2004  . COLONOSCOPY    . JOINT REPLACEMENT    . LAPAROSCOPIC CHOLECYSTECTOMY  ~ 2009  . TOTAL HIP ARTHROPLASTY Right 07/23/2016   Procedure: RIGHT TOTAL HIP ARTHROPLASTY ANTERIOR APPROACH;  Surgeon: Frederik Pear, MD;  Location: [place];  Service: Orthopedics;  Laterality: Right;  . TOTAL SHOULDER ARTHROPLASTY Left 07/10/2015  . TOTAL SHOULDER ARTHROPLASTY Left 07/10/2015   Procedure: LEFT TOTAL SHOULDER ARTHROPLASTY;  Surgeon: Tania Ade, MD;  Location: [place];  Service: Orthopedics;  Laterality: Left;  Left shoulder arthroplasty  . VAGINAL HYSTERECTOMY      Social History   Social History  . Marital status: Widowed    Spouse name: [name]  . Number of children: 4  . Years of education: N/A   Occupational History  .  Retired Network engineer   .  Retired   Social History Main Topics  . Smoking status: Former Smoker    Packs/day: 1.00    Years: 6.00    Types: Cigarettes    Quit date: 10/18/1960  . Smokeless tobacco: Never Used  . Alcohol use 2.4 oz/week    2 Glasses of wine, 2 Standard drinks or equivalent per week  . Drug use: No  . Sexual activity: No   Other Topics Concern  . Not on file   Social History Narrative   Retired.  Some college. Husband [name] passed away in 2014/02/10, had alzheimers.   Has 4 adult children. Lives with her daughter and granddaughter.     Regular exercise-yes    Family History  Problem Relation Age of Onset  . Stroke Father   . Emphysema  Father   . Atrial fibrillation      Outpatient Encounter Prescriptions as of 02/04/2017  Medication Sig  . diltiazem (CARDIZEM CD) 120 MG 24 hr capsule Take 120 mg by mouth daily.  . meloxicam (MOBIC) 7.5 MG tablet TAKE 1 TABLET (7.5 MG TOTAL) BY MOUTH DAILY.  . metoprolol succinate (TOPROL-XL) 25 MG 24 hr tablet Take 25 mg by mouth every evening.  Marland Kitchen MISC NATURAL PRODUCTS PO Take by mouth. Uricalm  . Multiple Vitamins-Minerals (PRESERVISION AREDS 2 PO) Take 1 tablet by mouth 2 (two) times daily.  Marland Kitchen PREVIDENT 5000 SENSITIVE 1.1-5 % PSTE Take 1 application by mouth at bedtime.   Marland Kitchen warfarin (COUMADIN) 5 MG tablet TAKE 1 TABLET BY MOUTH EVERY DAY (Patient taking differently: Take 5mg s daily except on saturdays take 2.5mg )  . QUEtiapine (SEROQUEL) 50 MG tablet 1 tab po QHS x 2 days, then increase to1 tab po  BID x 2 days. Then increase to 2 tabs at bedtime and 1 in AM x 2 days. Then increase to 2 tabs po BID.   No facility-administered encounter medications on file as of 02/04/2017.          Objective:   Physical Exam  Constitutional: She is oriented to person, place, and time. She appears well-developed and well-nourished.  HENT:  Head: Normocephalic and atraumatic.  Eyes: Conjunctivae and EOM are normal.  Cardiovascular: Normal rate.   Pulmonary/Chest: Effort normal.  Neurological: She is alert and oriented to person, place, and time.  Skin: Skin is dry. No pallor.  She has significant bruising and swelling on the dorsum of the hand proximally over the fifth metacarpal bone. She is also very tender to touch and squeeze. Normal range of motion of the pinky. She has significant bruising over the right knee with trace edema and over the right shin. Just on the base of her chin she has a laceration that appears to be healing well. No significant erythema but it almost looks like she starting to form some granulomatous tissue. I suspect that is what has been actually draining I do not see any  pocket of abscess or significant induration.  Psychiatric: She has a normal mood and affect. Her behavior is normal.  Vitals reviewed.      Assessment & Plan:  Chin laceration-recommend recheck in one week. If she is developing granulomatous tissue then we may need to consider further treatment with either cauterization or possible Kenalog injection as she does have a history of hypertrophic scarring. I did give her warning signs and symptoms for infection and cough immediately if she is concerned about possible infection.  Left hand pain-the only x-ray the fifth  digit. I think at this point we do need to get an x-ray of her hand to rule out fracture of the metacarpal bone.  Right knee and shin pain-appears to be healing well though she has significant bruising but she is also on Coumadin.

## 2017-02-11 ENCOUNTER — Encounter: Payer: Self-pay | Admitting: Family Medicine

## 2017-02-11 ENCOUNTER — Ambulatory Visit (INDEPENDENT_AMBULATORY_CARE_PROVIDER_SITE_OTHER): Payer: Medicare Other | Admitting: Family Medicine

## 2017-02-11 VITALS — BP 132/83 | HR 87 | Ht 63.0 in | Wt 150.0 lb

## 2017-02-11 DIAGNOSIS — S0181XD Laceration without foreign body of other part of head, subsequent encounter: Secondary | ICD-10-CM

## 2017-02-11 DIAGNOSIS — S62357D Nondisplaced fracture of shaft of fifth metacarpal bone, left hand, subsequent encounter for fracture with routine healing: Secondary | ICD-10-CM

## 2017-02-11 NOTE — Progress Notes (Signed)
   Subjective:    Patient ID: Terri Small, female    DOB: [birth year], 83 y.o.   MRN: [id number]  HPI 83 year old female here to recheck chin laceration. Please see previous note. She was starting to develop some of this granulomatous tissue. I wanted to keep an eye on as she does have a history of hypertrophic scarring. A little bit of serous drainage from the wound at that time. She says it is not longer draining. Still feels a little swollen still.    Fifth metacarpal fracture of the left hand. We did x-rays about a week ago. I encouraged her to get an over-the-counter cock-up splint provide supports that she's not overusing the hand. It is a nondisplaced fracture. She says the swelling has actually gone down significantly but she still having a fair amount of soreness and discomfort. But she actually wore the brace backwards for about 3 days before looking instructions and the box and realizing that she had at home the way.   Review of Systems     Objective:   Physical Exam  Constitutional: She is oriented to person, place, and time. She appears well-developed and well-nourished.  HENT:  Head: Normocephalic and atraumatic.  Eyes: Conjunctivae and EOM are normal.  Cardiovascular: Normal rate.   Pulmonary/Chest: Effort normal.  Musculoskeletal:  Dorsum of left hand over the proximal fourth and fifth metacarpals are still little bit of swelling but it's much improved from a week ago. She is still tender over the mid area of the fifth metacarpal. But less tender over the wrist today which is reassuring.  Neurological: She is alert and oriented to person, place, and time.  Skin: Skin is dry. No pallor.  Chin lac is healing well. Just a small 8 mm scab in place.   Granulation tissue absent.    Psychiatric: She has a normal mood and affect. Her behavior is normal.  Vitals reviewed.         Assessment & Plan:  Chin laceration - I actually think her lesion is healing very well. She does  have a scab and most of the granulomatous tissue has actually resolved. I did encourage her to moisturize it well with some petroleum jelly at least twice a day. I suspect she will have a little bit of permanent scarring and firm tissue there area she does have a tendency to form hypertrophic scars and we will keep an eye on it.  Fifth metacarpal fracture, left hand-subsequent encounter. Continue with cock-up splint for probably about 6 weeks total. If after 2 weeks still having some significant discomfort and recommend repeat x-ray to make sure that the area is starting to show some calcification and improvement in healing.

## 2017-02-11 NOTE — Patient Instructions (Signed)
Can repeat hand xray in about 2 weeks especially if still having pain.

## 2017-02-28 ENCOUNTER — Telehealth: Payer: Self-pay | Admitting: Family Medicine

## 2017-02-28 NOTE — Telephone Encounter (Signed)
Pt would like to know of it is safe to take mucinex or mucinex D with her current medical conditions. Routing to PCP for review.

## 2017-02-28 NOTE — Telephone Encounter (Signed)
Pt advised.

## 2017-02-28 NOTE — Telephone Encounter (Signed)
Try the plain Mucinex first. No decongestant

## 2017-03-09 ENCOUNTER — Ambulatory Visit (INDEPENDENT_AMBULATORY_CARE_PROVIDER_SITE_OTHER): Payer: Medicare Other | Admitting: Family Medicine

## 2017-03-09 DIAGNOSIS — I4891 Unspecified atrial fibrillation: Secondary | ICD-10-CM | POA: Diagnosis not present

## 2017-03-09 LAB — POCT INR: INR: 1.9

## 2017-03-09 NOTE — Progress Notes (Signed)
Pt advised of dosage change, verbalized understanding.

## 2017-03-23 ENCOUNTER — Ambulatory Visit (INDEPENDENT_AMBULATORY_CARE_PROVIDER_SITE_OTHER): Payer: Medicare Other | Admitting: Family Medicine

## 2017-03-23 DIAGNOSIS — I4891 Unspecified atrial fibrillation: Secondary | ICD-10-CM

## 2017-03-23 LAB — POCT INR: INR: 2.4

## 2017-03-23 NOTE — Progress Notes (Signed)
Pt advised, verbalized understanding. She will call to schedule.

## 2017-04-08 ENCOUNTER — Ambulatory Visit (INDEPENDENT_AMBULATORY_CARE_PROVIDER_SITE_OTHER): Payer: Medicare Other | Admitting: Physician Assistant

## 2017-04-08 ENCOUNTER — Encounter: Payer: Self-pay | Admitting: Physician Assistant

## 2017-04-08 VITALS — BP 99/64 | HR 52 | Temp 97.5°F | Wt 150.0 lb

## 2017-04-08 DIAGNOSIS — R300 Dysuria: Secondary | ICD-10-CM | POA: Diagnosis not present

## 2017-04-08 DIAGNOSIS — Z87440 Personal history of urinary (tract) infections: Secondary | ICD-10-CM | POA: Diagnosis not present

## 2017-04-08 MED ORDER — CEFUROXIME AXETIL 250 MG PO TABS: 250.0000 mg | ORAL_TABLET | Freq: Two times a day (BID) | ORAL | 0 refills | Status: AC

## 2017-04-08 NOTE — Patient Instructions (Addendum)
- Take antibiotic twice a day for 7 days - Continue Azo as needed for painful urination/dysuria - You will get a phone call within 3 business days to schedule an appointment with Urology, [facility] in [place] Infection, Adult A urinary tract infection (UTI) is an infection of any part of the urinary tract, which includes the kidneys, ureters, bladder, and urethra. These organs make, store, and get rid of urine in the body. UTI can be a bladder infection (cystitis) or kidney infection (pyelonephritis). What are the causes? This infection may be caused by fungi, viruses, or bacteria. Bacteria are the most common cause of UTIs. This condition can also be caused by repeated incomplete emptying of the bladder during urination. What increases the risk? This condition is more likely to develop if:  You ignore your need to urinate or hold urine for long periods of time.  You do not empty your bladder completely during urination.  You wipe back to front after urinating or having a bowel movement, if you are female.  You are uncircumcised, if you are female.  You are constipated.  You have a urinary catheter that stays in place (indwelling).  You have a weak defense (immune) system.  You have a medical condition that affects your bowels, kidneys, or bladder.  You have diabetes.  You take antibiotic medicines frequently or for long periods of time, and the antibiotics no longer work well against certain types of infections (antibiotic resistance).  You take medicines that irritate your urinary tract.  You are exposed to chemicals that irritate your urinary tract.  You are female.  What are the signs or symptoms? Symptoms of this condition include:  Fever.  Frequent urination or passing small amounts of urine frequently.  Needing to urinate urgently.  Pain or burning with urination.  Urine that smells bad or unusual.  Cloudy urine.  Pain in the lower  abdomen or back.  Trouble urinating.  Blood in the urine.  Vomiting or being less hungry than normal.  Diarrhea or abdominal pain.  Vaginal discharge, if you are female.  How is this diagnosed? This condition is diagnosed with a medical history and physical exam. You will also need to provide a urine sample to test your urine. Other tests may be done, including:  Blood tests.  Sexually transmitted disease (STD) testing.  If you have had more than one UTI, a cystoscopy or imaging studies may be done to determine the cause of the infections. How is this treated? Treatment for this condition often includes a combination of two or more of the following:  Antibiotic medicine.  Other medicines to treat less common causes of UTI.  Over-the-counter medicines to treat pain.  Drinking enough water to stay hydrated.  Follow these instructions at home:  Take over-the-counter and prescription medicines only as told by your health care provider.  If you were prescribed an antibiotic, take it as told by your health care provider. Do not stop taking the antibiotic even if you start to feel better.  Avoid alcohol, caffeine, tea, and carbonated beverages. They can irritate your bladder.  Drink enough fluid to keep your urine clear or pale yellow.  Keep all follow-up visits as told by your health care provider. This is important.  Make sure to: ? Empty your bladder often and completely. Do not hold urine for long periods of time. ? Empty your bladder before and after sex. ? Wipe from front to back after a bowel  movement if you are female. Use each tissue one time when you wipe. Contact a health care provider if:  You have back pain.  You have a fever.  You feel nauseous or vomit.  Your symptoms do not get better after 3 days.  Your symptoms go away and then return. Get help right away if:  You have severe back pain or lower abdominal pain.  You are vomiting and cannot keep  down any medicines or water. This information is not intended to replace advice given to you by your health care provider. Make sure you discuss any questions you have with your health care provider. Document Released: 07/14/2005 Document Revised: 03/17/2016 Document Reviewed: 08/25/2015 Elsevier Interactive Patient Education  2017 [employer].

## 2017-04-08 NOTE — Progress Notes (Signed)
HPI:                                                                Terri Small is a 83 y.o. female who presents to [facility] today for dysuria  Urinary Tract Infection   This is a new problem. The current episode started yesterday. The problem occurs every urination. The problem has been gradually worsening. There has been no fever. Pertinent negatives include no hematuria, nausea or sweats. Associated symptoms comments: + suprapubic pressure. Treatments tried: Azo, cranberry pills. Her past medical history is significant for recurrent UTIs.  Patient reports a history of chronic UTI, for which she has been followed by [facility]. She has been on prophylactic Keflex daily, which she discontinued about 6 months ago. She reports she self-treated a UTI in April by "doubling up" on her leftover Keflex.   Past Medical History:  Diagnosis Date  . Arthritis    "everywhere"  . Atrial fibrillation ([place])    s/p 2x ablation on chronic coumadin, previously on amiodarone but had pulmonary SE.  Marland Kitchen CHF (congestive heart failure) ([place])   . COPD (chronic obstructive pulmonary disease) [facility])    "pulmonologist told me last week my lungs are good" (07/10/2015)  . Diverticulitis   . Dysrhythmia   . Fracture of lateral malleolus 11/17/2013  . Fracture, fibula 11/26/2013  . Fracture, sternum closed 11/26/13  . GERD (gastroesophageal reflux disease)   . Headache   . Herpes zoster   . Osteoporosis   . Shortness of breath dyspnea    Past Surgical History:  Procedure Laterality Date  . ATRIAL FIBRILLATION ABLATION  ~ 2002; ~ 2004  . CARDIAC CATHETERIZATION  ~ 2002; ~ 2004  . COLONOSCOPY    . JOINT REPLACEMENT    . LAPAROSCOPIC CHOLECYSTECTOMY  ~ 2009  . TOTAL HIP ARTHROPLASTY Right 07/23/2016   Procedure: RIGHT TOTAL HIP ARTHROPLASTY ANTERIOR APPROACH;  Surgeon: Frederik Pear, MD;  Location: [facility];  Service: Orthopedics;  Laterality: Right;  . TOTAL  SHOULDER ARTHROPLASTY Left 07/10/2015  . TOTAL SHOULDER ARTHROPLASTY Left 07/10/2015   Procedure: LEFT TOTAL SHOULDER ARTHROPLASTY;  Surgeon: Tania Ade, MD;  Location: [facility];  Service: Orthopedics;  Laterality: Left;  Left shoulder arthroplasty  . VAGINAL HYSTERECTOMY     Social History  Substance Use Topics  . Smoking status: Former Smoker    Packs/day: 1.00    Years: 6.00    Types: Cigarettes    Quit date: 10/18/1960  . Smokeless tobacco: Never Used  . Alcohol use 2.4 oz/week    2 Glasses of wine, 2 Standard drinks or equivalent per week   family history includes Emphysema in her father; Stroke in her father.  ROS: negative except as noted in the HPI  Medications: Current Outpatient Prescriptions  Medication Sig Dispense Refill  . diltiazem (CARDIZEM CD) 120 MG 24 hr capsule Take 120 mg by mouth daily.    . meloxicam (MOBIC) 7.5 MG tablet TAKE 1 TABLET (7.5 MG TOTAL) BY MOUTH DAILY. 30 tablet 0  . metoprolol succinate (TOPROL-XL) 25 MG 24 hr tablet Take 25 mg by mouth every evening.  3  . MISC NATURAL PRODUCTS PO Take by mouth. Uricalm    . Multiple Vitamins-Minerals (PRESERVISION  AREDS 2 PO) Take 1 tablet by mouth 2 (two) times daily.    Marland Kitchen PREVIDENT 5000 SENSITIVE 1.1-5 % PSTE Take 1 application by mouth at bedtime.   6  . warfarin (COUMADIN) 5 MG tablet TAKE 1 TABLET BY MOUTH EVERY DAY (Patient taking differently: Take 5mg s daily except on saturdays take 2.5mg ) 90 tablet 2  . cefUROXime (CEFTIN) 250 MG tablet Take 1 tablet (250 mg total) by mouth 2 (two) times daily. 14 tablet 0   No current facility-administered medications for this visit.    Allergies  Allergen Reactions  . Nexium [Esomeprazole Magnesium] Other (See Comments)    Chest heaviness.  . Sulfonamide Derivatives Other (See Comments)    REACTION: rash- burning on face  . Nitrofurantoin Other (See Comments)    Pulmonary fibrosis        Objective:  BP 99/64   Pulse (!) 52   Temp 97.5 F (36.4 C)  (Oral)   Wt 150 lb (68 kg)   BMI 26.57 kg/m  Gen: alert, well-groomed, cooperative, not ill-appearing, no distress HEENT: normal conjunctiva, wearing glasses Pulm: Normal work of breathing, normal phonation, clear to auscultation bilaterally, no wheezes, rales or rhonchi CV: Normal rate, irregular rhythm, s1 and s2 distinct, no murmurs, clicks or rubs  GI: abdomen soft, nondistended, there is suprapubic tenderness, no CVA tenderness MSK: moving all extremities, normal gait and station, no peripheral edema Skin: warm, dry, intact; no rashes on exposed skin   No results found for this or any previous visit (from the past 72 hour(s)). No results found.    Assessment and Plan: 83 y.o. female with   1. Dysuria - suspect uncomplicated cystitis. Unable to perform POCT urinalysis due to scant urine sample - Urine Culture pending - reviewed prior urine culture from 11/06/14, which showed susceptibility to 2ndG cephalosporins - cefUROXime (CEFTIN) 250 MG tablet; Take 1 tablet (250 mg total) by mouth 2 (two) times daily.  Dispense: 14 tablet; Refill: 0  2. History of recurrent UTI (urinary tract infection) - reviewed prior A&P note from Dr. Marnee Guarneri, Urology 09/30/15, which confirms patients history of recurrent UTI - Ambulatory referral to Urology, patient prefers [facility] [place] location due to transportation limitations  Patient education and anticipatory guidance given Patient agrees with treatment plan Follow-up as needed if symptoms worsen or fail to improve  Darlyne Russian PA-C

## 2017-04-12 ENCOUNTER — Ambulatory Visit (INDEPENDENT_AMBULATORY_CARE_PROVIDER_SITE_OTHER): Payer: Medicare Other | Admitting: Family Medicine

## 2017-04-12 DIAGNOSIS — I4891 Unspecified atrial fibrillation: Secondary | ICD-10-CM | POA: Diagnosis not present

## 2017-04-12 LAB — POCT INR: INR: 2.7

## 2017-04-14 LAB — URINE CULTURE

## 2017-04-14 NOTE — Progress Notes (Signed)
Urine culture reviewed. Sensitive to Cephalosporins.

## 2017-05-01 ENCOUNTER — Other Ambulatory Visit: Payer: Self-pay | Admitting: Family Medicine

## 2017-05-09 ENCOUNTER — Other Ambulatory Visit: Payer: Self-pay

## 2017-05-10 ENCOUNTER — Ambulatory Visit: Payer: Medicare Other

## 2017-05-12 DIAGNOSIS — H25812 Combined forms of age-related cataract, left eye: Secondary | ICD-10-CM | POA: Insufficient documentation

## 2017-05-23 ENCOUNTER — Ambulatory Visit (INDEPENDENT_AMBULATORY_CARE_PROVIDER_SITE_OTHER): Payer: Medicare Other | Admitting: Family Medicine

## 2017-05-23 DIAGNOSIS — I4891 Unspecified atrial fibrillation: Secondary | ICD-10-CM

## 2017-05-23 LAB — POCT INR: INR: 2.5

## 2017-05-24 NOTE — Progress Notes (Signed)
Left detailed vm with recommendations.

## 2017-06-08 ENCOUNTER — Ambulatory Visit (INDEPENDENT_AMBULATORY_CARE_PROVIDER_SITE_OTHER): Payer: Medicare Other | Admitting: Family Medicine

## 2017-06-08 DIAGNOSIS — I482 Chronic atrial fibrillation, unspecified: Secondary | ICD-10-CM

## 2017-06-08 LAB — POCT INR: INR: 3

## 2017-06-08 NOTE — Progress Notes (Signed)
Patient advised of recommendations.  

## 2017-06-09 ENCOUNTER — Ambulatory Visit: Payer: Medicare Other

## 2017-07-01 ENCOUNTER — Ambulatory Visit: Payer: Medicare Other

## 2017-07-07 ENCOUNTER — Ambulatory Visit (INDEPENDENT_AMBULATORY_CARE_PROVIDER_SITE_OTHER): Payer: Medicare Other | Admitting: Family Medicine

## 2017-07-07 DIAGNOSIS — I482 Chronic atrial fibrillation, unspecified: Secondary | ICD-10-CM

## 2017-07-07 LAB — POCT INR: INR: 2

## 2017-07-07 NOTE — Progress Notes (Signed)
Patient advised of recommendations.  

## 2017-07-29 ENCOUNTER — Ambulatory Visit (INDEPENDENT_AMBULATORY_CARE_PROVIDER_SITE_OTHER): Payer: Medicare Other | Admitting: Family Medicine

## 2017-07-29 VITALS — BP 105/51 | HR 51

## 2017-07-29 DIAGNOSIS — Z23 Encounter for immunization: Secondary | ICD-10-CM

## 2017-07-29 DIAGNOSIS — I482 Chronic atrial fibrillation, unspecified: Secondary | ICD-10-CM

## 2017-07-29 LAB — POCT INR: INR: 2.8

## 2017-07-29 NOTE — Progress Notes (Signed)
Pt advised,verbalized understanding. 

## 2017-08-02 ENCOUNTER — Telehealth: Payer: Self-pay | Admitting: Family Medicine

## 2017-08-02 DIAGNOSIS — I482 Chronic atrial fibrillation, unspecified: Secondary | ICD-10-CM

## 2017-08-02 NOTE — Telephone Encounter (Signed)
Terri Small called. She will be visiting her daughter for six weeks in [place] and needs a lab order/rx to get pt/inr done while she's there. She would like to get order today if possible(she no longer drives)so that her daughter can pick it up on her way home.  Thank you.

## 2017-08-02 NOTE — Telephone Encounter (Signed)
External lab order placed. Pt advised. Her daughter will pick up today.

## 2017-08-02 NOTE — Telephone Encounter (Signed)
4 copies printed, each stamped with our information so results can be sent.

## 2017-08-02 NOTE — Telephone Encounter (Signed)
OK for standing order for PT/INR Q 2 weeks.

## 2017-08-10 ENCOUNTER — Telehealth: Payer: Self-pay | Admitting: *Deleted

## 2017-08-10 MED ORDER — CIPROFLOXACIN HCL 500 MG PO TABS: 500.0000 mg | ORAL_TABLET | Freq: Two times a day (BID) | ORAL | 0 refills | Status: DC

## 2017-08-10 NOTE — Telephone Encounter (Signed)
Pt called and reported that she was experiencing UTI sxs. She denies fevers, abdominal pain, back pain, n/v. She stated that her urine is cloudy and has an odor. She tested her urine on a AZO strip. She will be leaving for [facility] tomorrow and is unable to come in today due to not having transportation. She is asking if something can be sent to the pharmacy.  Spoke to Dr. Madilyn Fireman she sent Cipro 500 mg 1 tab BID x 3 days.Audelia Hives [place]

## 2017-08-20 ENCOUNTER — Encounter: Payer: Self-pay | Admitting: Family Medicine

## 2017-08-23 ENCOUNTER — Telehealth: Payer: Self-pay

## 2017-08-23 DIAGNOSIS — I482 Chronic atrial fibrillation, unspecified: Secondary | ICD-10-CM

## 2017-08-23 LAB — PROTIME-INR

## 2017-08-23 NOTE — Telephone Encounter (Signed)
Same dose.  Recheck in 4 weeks.  See anticoagulation flowsheet.

## 2017-08-23 NOTE — Telephone Encounter (Signed)
Lab results INR 2.8 PT 28.7 from 08/20/17.

## 2017-08-23 NOTE — Patient Instructions (Signed)
Same dose. Recheck in 4 weeks

## 2017-08-24 NOTE — Telephone Encounter (Signed)
Left recommendation on Pt's VM, callback provided for questions.

## 2017-09-06 ENCOUNTER — Ambulatory Visit: Payer: Medicare Other | Admitting: Family Medicine

## 2017-09-26 ENCOUNTER — Other Ambulatory Visit: Payer: Self-pay | Admitting: Family Medicine

## 2017-09-26 ENCOUNTER — Ambulatory Visit: Payer: Medicare Other | Admitting: Family Medicine

## 2017-09-30 ENCOUNTER — Ambulatory Visit: Payer: Medicare Other

## 2017-10-03 ENCOUNTER — Ambulatory Visit (INDEPENDENT_AMBULATORY_CARE_PROVIDER_SITE_OTHER): Payer: Medicare Other | Admitting: Family Medicine

## 2017-10-03 DIAGNOSIS — I4891 Unspecified atrial fibrillation: Secondary | ICD-10-CM

## 2017-10-03 LAB — POCT INR: INR: 3

## 2017-10-03 NOTE — Progress Notes (Signed)
Patient advised of recommendations.  

## 2017-10-06 ENCOUNTER — Ambulatory Visit (INDEPENDENT_AMBULATORY_CARE_PROVIDER_SITE_OTHER): Payer: Medicare Other | Admitting: Family Medicine

## 2017-10-06 ENCOUNTER — Encounter: Payer: Self-pay | Admitting: Family Medicine

## 2017-10-06 VITALS — BP 127/72 | HR 80 | Wt 158.0 lb

## 2017-10-06 DIAGNOSIS — I872 Venous insufficiency (chronic) (peripheral): Secondary | ICD-10-CM | POA: Diagnosis not present

## 2017-10-06 DIAGNOSIS — I429 Cardiomyopathy, unspecified: Secondary | ICD-10-CM

## 2017-10-06 DIAGNOSIS — J321 Chronic frontal sinusitis: Secondary | ICD-10-CM

## 2017-10-06 DIAGNOSIS — I4891 Unspecified atrial fibrillation: Secondary | ICD-10-CM | POA: Diagnosis not present

## 2017-10-06 MED ORDER — AMOXICILLIN-POT CLAVULANATE 875-125 MG PO TABS: 1.0000 | ORAL_TABLET | Freq: Two times a day (BID) | ORAL | 0 refills | Status: DC

## 2017-10-06 MED ORDER — PREVIDENT 5000 SENSITIVE 1.1-5 % DT PSTE: 1.0000 "application " | PASTE | Freq: Every day | DENTAL | 1 refills | Status: DC

## 2017-10-06 NOTE — Progress Notes (Signed)
   Subjective:    Patient ID: Terri Small, female    DOB: [birth year], 83 y.o.   MRN: [id number]  HPI Follow-up for chronic atrial fibrillation-she is currently on Coumadin.  She is on metoprolol and diltiazem for rate control. No current symptoms from her afib.  Trace ankle edema   Congestive heart failure with left ventricular diastolic dysfunction-currently on metoprolol and diltiazem.  She has not had any excess swelling or changes in fluid.  No chest pain or palpitations. She has had some SOB but says it really comes and goes.  She really feels it is age related.    Also reports that she had an upper respiratory infection back in April and ever since then she is had persistent nasal congestion and drainage.  No fevers chills or sweats.  Occasional headaches but those are not necessarily new for her.  Occasionally she is getting some green colored discharge and some dark blood intermittently.  It has not really gotten worse which is why she has not come in but has been persistent.  She denies any chest pain.  She does report occasional shortness of breath but not because of the illness.  She also is getting some venous stasis dermatitis which is getting some skin color change.  She was recommended to get some compression stockings.  She also wanted to know if I would be willing to take over her PreviDent prescription toothpaste.  Review of Systems     Objective:   Physical Exam  Constitutional: She is oriented to person, place, and time. She appears well-developed and well-nourished.  HENT:  Head: Normocephalic and atraumatic.  Cardiovascular: Normal rate, regular rhythm and normal heart sounds.  Pulmonary/Chest: Effort normal and breath sounds normal.  Neurological: She is alert and oriented to person, place, and time.  Skin: Skin is warm and dry.  She does have some hyperpigmentation's along the shins of the anterior tibia area on both legs.  Some trace edema bilaterally as well.   Psychiatric: She has a normal mood and affect. Her behavior is normal.       Assessment & Plan:  Chronic atrial fibrillation- still following her coumadin.  She comes in regularly for her Coumadin check.  CMP and lipid panel.  Persistent nasal congestion and drainage.  Could be that she has a full sinus infection or even chronic sinusitis at this point.  Medic put her on 10 days of Augmentin and see if she feels better.  Consider CT of the sinuses if not improving.  Venous stasis dermatitis-do recommend that she get some compression stockings.  Recommended [facility] here locally and they can even order the ones without the toes if she would prefer.   She also reported that she and her daughter will be moving to [place] outside of [place].  Hopefully at the end of January. Did refill her prescription toothpaste PreviDent.

## 2017-10-07 ENCOUNTER — Ambulatory Visit (INDEPENDENT_AMBULATORY_CARE_PROVIDER_SITE_OTHER): Payer: Medicare Other | Admitting: Physical Therapy

## 2017-10-07 ENCOUNTER — Encounter: Payer: Self-pay | Admitting: Physical Therapy

## 2017-10-07 DIAGNOSIS — R262 Difficulty in walking, not elsewhere classified: Secondary | ICD-10-CM

## 2017-10-07 DIAGNOSIS — M6281 Muscle weakness (generalized): Secondary | ICD-10-CM

## 2017-10-07 DIAGNOSIS — M25551 Pain in right hip: Secondary | ICD-10-CM | POA: Diagnosis not present

## 2017-10-07 NOTE — Patient Instructions (Addendum)
Abduction: Clam (Eccentric) - Side-Lying    Lie on side with knees bent. Lift top knee, keeping feet together. Keep trunk steady. Slowly lower for 3-5 seconds. _10__ reps per set, _2-3__ sets per day, __7_ days per week. Repeat on the other side.   Bridge    Lie back, legs bent. Inhale, pressing hips up. Keeping ribs in, lengthen lower back. Exhale, rolling down along spine from top. Repeat _3x10___ times. Do __1__ sessions per day.  Quads / HF, Prone    Lie face down, knees together. Grasp right ankle with same-side hand. Use towel if needed to reach. Gently pull foot toward buttock. Hold _30-45__ seconds. Repeat __2_ times per session. Do _1-2__ sessions per day.  Supine With Rotation    Lie on back with right knee drawn toward chest. Slowly bring bent leg across body until stretch is felt in outside of thigh and lower back area. Hold _30-45__ seconds. Repeat to other side. Repeat _2__ times per session. Do _1-2__ sessions per day.  Piriformis Stretch, Supine    Lie supine, legs bent, feet flat. Raise right bent leg and, grasping ankle with both hands, pull leg toward opposite shoulder. Hold _20-30__ seconds.  Repeat _2__ times per session. Do _1-2__ sessions per day. Perform with other leg straight.   Rub and massage the out side of right thigh, use heat to help it relax

## 2017-10-07 NOTE — Therapy (Signed)
[place] [employer] [place], [place], [address] Phone: [phone]   Fax:  [phone]  Physical Therapy Evaluation  Patient Details  Name: Chizara Mena MRN: [id number] Date of Birth: 07-14-1931 Referring Provider: Dr Frederik Pear   Encounter Date: 10/07/2017  PT End of Session - 10/07/17 1110    Visit Number  1    Number of Visits  6    Date for PT Re-Evaluation  10/28/17    PT Start Time  1110    PT Stop Time  1208    PT Time Calculation (min)  58 min    Activity Tolerance  Patient tolerated treatment well       Past Medical History:  Diagnosis Date  . Arthritis    "everywhere"  . Atrial fibrillation ([place])    s/p 2x ablation on chronic coumadin, previously on amiodarone but had pulmonary SE.  Marland Kitchen CHF (congestive heart failure) ([place])   . COPD (chronic obstructive pulmonary disease) [facility])    "pulmonologist told me last week my lungs are good" (07/10/2015)  . Diverticulitis   . Dysrhythmia   . Fracture of lateral malleolus 11/17/2013  . Fracture, fibula 11/26/2013  . Fracture, sternum closed 11/26/13  . GERD (gastroesophageal reflux disease)   . Headache   . Herpes zoster   . Osteoporosis   . Shortness of breath dyspnea     Past Surgical History:  Procedure Laterality Date  . ATRIAL FIBRILLATION ABLATION  ~ 2002; ~ 2004  . CARDIAC CATHETERIZATION  ~ 2002; ~ 2004  . COLONOSCOPY    . JOINT REPLACEMENT    . LAPAROSCOPIC CHOLECYSTECTOMY  ~ 2009  . TOTAL HIP ARTHROPLASTY Right 07/23/2016   Procedure: RIGHT TOTAL HIP ARTHROPLASTY ANTERIOR APPROACH;  Surgeon: Frederik Pear, MD;  Location: [facility];  Service: Orthopedics;  Laterality: Right;  . TOTAL SHOULDER ARTHROPLASTY Left 07/10/2015  . TOTAL SHOULDER ARTHROPLASTY Left 07/10/2015   Procedure: LEFT TOTAL SHOULDER ARTHROPLASTY;  Surgeon: Tania Ade, MD;  Location: [facility];  Service: Orthopedics;  Laterality: Left;  Left shoulder arthroplasty  . VAGINAL HYSTERECTOMY       There were no vitals filed for this visit.   Subjective Assessment - 10/07/17 1111    Subjective  Pt reports she had her Rt THA 15 months ago, she developed some twinging in the outside of her Rt hip about 2-3 months ago so she went to the MD.  He thinks it is from the muscle and bursitis.     How long can you sit comfortably?  no limitations    Diagnostic tests  x-rays - show good placement and healing of the hip replacement    Patient Stated Goals  not to have the pain come back, and ease her mind on the pain.     Currently in Pain?  No/denies         [facility] PT Assessment - 10/07/17 0001      Assessment   Medical Diagnosis  Rt THA     Referring Provider  Dr Frederik Pear    Onset Date/Surgical Date  07/08/17    Hand Dominance  Right    Next MD Visit  PRN    Prior Therapy  15 months ago after her replacement      Precautions   Precautions  None      Balance Screen   Has the patient fallen in the past 6 months  No      Home Environment   Living  Environment  Private residence    Living Arrangements  Children      Prior Function   Level of Independence  Independent    Leisure  crochet, sendintary, tries for daily walking for 0.50 mile on level surfaces      Observation/Other Assessments   Focus on Therapeutic Outcomes (FOTO)   38% limited      Functional Tests   Functional tests  Squat      Squat   Comments  bilat LE adduction and knee pain.       Posture/Postural Control   Posture/Postural Control  Postural limitations    Postural Limitations  Rounded Shoulders;Forward head;Increased thoracic kyphosis      ROM / Strength   AROM / PROM / Strength  Strength;AROM      AROM   AROM Assessment Site  Lumbar;Hip    Lumbar Flexion  WNL    Lumbar Extension  WNL    Lumbar - Right Rotation  WNL    Lumbar - Left Rotation  WNL      Strength   Strength Assessment Site  Hip;Knee    Right/Left Hip  Right;Left    Right Hip Flexion  5/5    Right Hip Extension  4+/5     Right Hip ABduction  4-/5    Left Hip Flexion  5/5    Left Hip Extension  -- 5-/5    Left Hip ABduction  4+/5      Flexibility   Soft Tissue Assessment /Muscle Length  yes    Hamstrings  supine SLR Rt 85, Lt 88    Quadriceps  prone knee flex Rt 114, Lt 134    ITB  Rt tight and tender      Palpation   Palpation comment  tightness and tenderness in Rt ITB              Objective measurements completed on examination: See above findings.      [place] Adult PT Treatment/Exercise - 10/07/17 0001      Exercises   Exercises  Knee/Hip      Knee/Hip Exercises: Stretches   Quad Stretch  Both;2 reps;30 seconds prone with strap    ITB Stretch  Both;2 reps;30 seconds supine trunk rotations    Piriformis Stretch  Both;2 reps;30 seconds      Knee/Hip Exercises: Sidelying   Clams  20 reps       Modalities   Modalities  Electrical Stimulation;Moist Heat      Moist Heat Therapy   Number Minutes Moist Heat  15 Minutes    Moist Heat Location  -- Rt ITB      Electrical Stimulation   Electrical Stimulation Location  Rt ITB    Electrical Stimulation Action   IFC    Electrical Stimulation Parameters  to tolerance    Electrical Stimulation Goals  Tone;Pain             PT Education - 10/07/17 1155    Education provided  Yes    Education Details  HEP    Person(s) Educated  Patient    Methods  Explanation;Handout    Comprehension  Returned demonstration;Verbalized understanding          PT Long Term Goals - 10/07/17 1206      PT LONG TERM GOAL #1   Title  Pt will improve Rt LE abduction to 4/5 to be able to stand longer with decreased symptoms ( 10/28/17)     Time  3  Period  Weeks    Status  New      PT LONG TERM GOAL #2   Title  maintain FOTO within 5 points for initial scoreof 38% limited ( 10/28/17)     Time  3    Period  Weeks    Status  New      PT LONG TERM GOAL #3   Title  report =/> 75% reduction in Rt lateral thigh pain with her daily activity (  10/28/17)     Time  3    Period  Weeks    Status  New      PT LONG TERM GOAL #4   Title  improve prone quad flexibility to within 10 degrees of Lt ( 10/28/17)     Time  3    Period  Weeks    Status  New             Plan - 10/16/2017 1158    Clinical Impression Statement  83 yo female presents with c/o lateral Rt thigh pain. She was concerned something was wrong with the THA she had ~ 15 months ago. X-rays from MD were (-), healing looked good,  Referred to PT for questionable bursitis and hip pain. She reports she no longer drives and will be limited to attendance due to this and requested 1-4 visits to give her things to work on at home.  She has some weakness in bilat hips, tightness in her Rt hip and tenderness with palpation in the Rt ITB.  She should respond well to therapy and instruction in HEP. She is currently living with her daughter and family and will be moving with them the end of January.     Clinical Presentation  Stable    Clinical Decision Making  Low    Rehab Potential  Excellent    PT Frequency  2x / week    PT Duration  3 weeks    PT Treatment/Interventions  Manual techniques;Taping;Patient/family education;Therapeutic exercise;Ultrasound;Cryotherapy;Electrical Stimulation;Moist Heat    PT Next Visit Plan  manual work lateral Rt hip, hip strengthening.     Consulted and Agree with Plan of Care  Patient       Patient will benefit from skilled therapeutic intervention in order to improve the following deficits and impairments:  Pain, Decreased strength, Decreased range of motion, Increased muscle spasms  Visit Diagnosis: Muscle weakness (generalized) - Plan: PT plan of care cert/re-cert  Pain of right hip joint - Plan: PT plan of care cert/re-cert  Difficulty in walking, not elsewhere classified - Plan: PT plan of care cert/re-cert  [facility] PT PB G-CODES - 2017-10-16 1157    Functional Assessment Tool Used   FOTO and professional judgement    Functional Limitations   Mobility: Walking and moving around    Mobility: Walking and Moving Around Current Status  At least 20 percent but less than 40 percent impaired, limited or restricted    Mobility: Walking and Moving Around Goal Status [phone])  At least 20 percent but less than 40 percent impaired, limited or restricted        Problem List Patient Active Problem List   Diagnosis Date Noted  . Combined forms of age-related cataract of left eye 05/12/2017  . Arthritis of right hip 07/23/2016  . Primary osteoarthritis of right hip 07/22/2016    Class: Chronic  . Squamous cell skin cancer 03/22/2016  . Foot pain, bilateral 12/10/2015  . Glenohumeral arthritis 07/10/2015  . Upper airway cough  syndrome 07/03/2015  . Fever blister 11/06/2014  . SOB (shortness of breath) 02/01/2014  . Chronic diastolic heart failure ([place]) 12/25/2013  . Cardiomyopathy ([place]) 12/21/2013  . Sternal fracture with retrosternal contusion 11/17/2013  . Pulmonary fibrosis ([place]) 10/16/2013  . TR (tricuspid regurgitation) 10/16/2013  . Congestive heart failure with left ventricular diastolic dysfunction ([place]) 08/27/2013  . Secondary pulmonary hypertension 08/27/2013  . CAFL (chronic airflow limitation) ([place]) 08/16/2013  . Acid reflux 08/16/2013  . MI (mitral incompetence) 06/30/2011  . History of recurrent UTI (urinary tract infection) 04/21/2010  . Atrial fibrillation ([place]) 05/13/2009  . OSTEOPOROSIS 05/13/2009  . HERPES ZOSTER 02/28/2009    Jeral Pinch PT  10/07/2017, 12:10 PM  [facility] [place], [place], [address] Phone: [phone]   Fax:  [phone]  Name: Shacora Zynda MRN: [id number] Date of Birth: Jun 03, 1931

## 2017-10-12 ENCOUNTER — Encounter: Payer: Medicare Other | Admitting: Physical Therapy

## 2017-10-19 ENCOUNTER — Ambulatory Visit (INDEPENDENT_AMBULATORY_CARE_PROVIDER_SITE_OTHER): Payer: Medicare Other | Admitting: Physical Therapy

## 2017-10-19 ENCOUNTER — Telehealth: Payer: Self-pay | Admitting: Family Medicine

## 2017-10-19 DIAGNOSIS — M6281 Muscle weakness (generalized): Secondary | ICD-10-CM | POA: Diagnosis present

## 2017-10-19 DIAGNOSIS — R262 Difficulty in walking, not elsewhere classified: Secondary | ICD-10-CM

## 2017-10-19 DIAGNOSIS — M25551 Pain in right hip: Secondary | ICD-10-CM

## 2017-10-19 DIAGNOSIS — I4891 Unspecified atrial fibrillation: Secondary | ICD-10-CM

## 2017-10-19 NOTE — Therapy (Addendum)
[place], [place], [address] Phone: [phone]   Fax:  [phone]  Physical Therapy Treatment  Patient Details  Name: Terri Small MRN: [id number] Date of Birth: [birth year] Referring Provider: Dr. Frederik Pear   Encounter Date: 10/19/2017  PT End of Session - 10/19/17 0947    Visit Number  2    Number of Visits  6    Date for PT Re-Evaluation  10/28/17    PT Start Time  0933    PT Stop Time  1020 MHP last 12 min     PT Time Calculation (min)  47 min       Past Medical History:  Diagnosis Date  . Arthritis    "everywhere"  . Atrial fibrillation ([place])    s/p 2x ablation on chronic coumadin, previously on amiodarone but had pulmonary SE.  Marland Kitchen CHF (congestive heart failure) ([place])   . COPD (chronic obstructive pulmonary disease) [facility])    "pulmonologist told me last week my lungs are good" (07/10/2015)  . Diverticulitis   . Dysrhythmia   . Fracture of lateral malleolus 11/17/2013  . Fracture, fibula 11/26/2013  . Fracture, sternum closed 11/26/13  . GERD (gastroesophageal reflux disease)   . Headache   . Herpes zoster   . Osteoporosis   . Shortness of breath dyspnea     Past Surgical History:  Procedure Laterality Date  . ATRIAL FIBRILLATION ABLATION  ~ 2002; ~ 2004  . CARDIAC CATHETERIZATION  ~ 2002; ~ 2004  . COLONOSCOPY    . JOINT REPLACEMENT    . LAPAROSCOPIC CHOLECYSTECTOMY  ~ 2009  . TOTAL HIP ARTHROPLASTY Right 07/23/2016   Procedure: RIGHT TOTAL HIP ARTHROPLASTY ANTERIOR APPROACH;  Surgeon: Frederik Pear, MD;  Location: [facility];  Service: Orthopedics;  Laterality: Right;  . TOTAL SHOULDER ARTHROPLASTY Left 07/10/2015  . TOTAL SHOULDER ARTHROPLASTY Left 07/10/2015   Procedure: LEFT TOTAL SHOULDER ARTHROPLASTY;  Surgeon: Tania Ade, MD;  Location: [facility];  Service: Orthopedics;  Laterality: Left;  Left shoulder arthroplasty  . VAGINAL HYSTERECTOMY      There were no vitals filed for this  visit.  Subjective Assessment - 10/19/17 0948    Subjective  Pt reports she has not had any twinging or pain in hip for last 2-3 wks.  She states this is may be her last visit.  She is doing well.     Patient Stated Goals  not to have the pain come back, and ease her mind on the pain.     Currently in Pain?  No/denies         [facility] PT Assessment - 10/19/17 0001      Assessment   Medical Diagnosis  Rt THA     Referring Provider  Dr. Frederik Pear    Onset Date/Surgical Date  07/08/17    Hand Dominance  Right    Next MD Visit  PRN      Observation/Other Assessments   Focus on Therapeutic Outcomes (FOTO)   18% limited      Flexibility   Hamstrings  bilat 90deg    Quadriceps  Rt knee 117 deg        [facility] Adult PT Treatment/Exercise - 10/19/17 0001      Knee/Hip Exercises: Stretches   Passive Hamstring Stretch  Right;Left;2 reps;30 seconds    Quad Stretch  Both;2 reps;30 seconds prone with strap, seated x 10 sec; pt shown sidelying version.   Piriformis Stretch  Right;Left;2 reps;30 seconds  fig 4, knee towards opp shoulder      Knee/Hip Exercises: Aerobic   Nustep  L4: 6 min       Knee/Hip Exercises: Supine   Bridges  1 set;10 reps hold 3 sec in ext      Knee/Hip Exercises: Sidelying   Clams  10 reps each side with cues to engage core to stabilize pelvis.       Modalities   Modalities  Moist Heat      Moist Heat Therapy   Number Minutes Moist Heat  12 Minutes    Moist Heat Location  Cervical                  PT Long Term Goals - 10/19/17 1016      PT LONG TERM GOAL #1   Title  Pt will improve Rt LE abduction to 4/5 to be able to stand longer with decreased symptoms ( 10/28/17)     Time  3    Period  Weeks    Status  On-going      PT LONG TERM GOAL #2   Title  maintain FOTO within 5 points for initial scoreof 38% limited ( 10/28/17)     Time  3    Period  Weeks    Status  Achieved      PT LONG TERM GOAL #3   Title  report =/> 75% reduction in Rt  lateral thigh pain with her daily activity ( 10/28/17)     Time  3    Period  Weeks    Status  Achieved      PT LONG TERM GOAL #4   Title  improve prone quad flexibility to within 10 degrees of Lt ( 10/28/17)     Time  3    Period  Weeks    Status  On-going      PT LONG TERM GOAL #5   Title  FOTO </= 36% limitation 11/14/15    Time  12    Period  Weeks    Status  Achieved            Plan - 10/19/17 1330    Clinical Impression Statement  Pt no longer having any Rt hip pain.  she has been compliant with HEP and reports it has been beneficial.  Pt had increased neck pain with prone quad stretch; shown alternative in sidelying and given heat at end of session to reduce pain.  FOTO score improved. Pt happy with her current level of function; she would like to hold therapy for 2 wks in case she has flare up.  Pt has partially met her goals.     Rehab Potential  Excellent    PT Frequency  2x / week    PT Duration  3 weeks    PT Treatment/Interventions  Manual techniques;Taping;Patient/family education;Therapeutic exercise;Ultrasound;Cryotherapy;Electrical Stimulation;Moist Heat    PT Next Visit Plan  spoke to supervising PT; will hold for 2 wks, until 11/02/17.     Consulted and Agree with Plan of Care  Patient       Patient will benefit from skilled therapeutic intervention in order to improve the following deficits and impairments:  Pain, Decreased strength, Decreased range of motion, Increased muscle spasms  Visit Diagnosis: Muscle weakness (generalized)  Pain of right hip joint  Difficulty in walking, not elsewhere classified     Problem List Patient Active Problem List   Diagnosis Date Noted  . Combined forms of age-related cataract  of left eye 05/12/2017  . Arthritis of right hip 07/23/2016  . Primary osteoarthritis of right hip 07/22/2016    Class: Chronic  . Squamous cell skin cancer 03/22/2016  . Foot pain, bilateral 12/10/2015  . Glenohumeral arthritis  07/10/2015  . Upper airway cough syndrome 07/03/2015  . Fever blister 11/06/2014  . SOB (shortness of breath) 02/01/2014  . Chronic diastolic heart failure ([place]) 12/25/2013  . Cardiomyopathy ([place]) 12/21/2013  . Sternal fracture with retrosternal contusion 11/17/2013  . Pulmonary fibrosis ([place]) 10/16/2013  . TR (tricuspid regurgitation) 10/16/2013  . Congestive heart failure with left ventricular diastolic dysfunction ([place]) 08/27/2013  . Secondary pulmonary hypertension 08/27/2013  . CAFL (chronic airflow limitation) ([place]) 08/16/2013  . Acid reflux 08/16/2013  . MI (mitral incompetence) 06/30/2011  . History of recurrent UTI (urinary tract infection) 04/21/2010  . Atrial fibrillation ([place]) 05/13/2009  . OSTEOPOROSIS 05/13/2009  . HERPES ZOSTER 02/28/2009   Kerin Perna, PTA 10/19/17 1:37 PM [facility] [place], [place], [address] Phone: [phone]   Fax:  [phone]  Name: Tensley Wery MRN: [id number] Date of Birth: July 08, 1931   PHYSICAL THERAPY DISCHARGE SUMMARY  Visits from Start of Care: 2  Current functional level related to goals / functional outcomes: See above   Remaining deficits: unknown   Education / Equipment: HEP Plan: Patient agrees to discharge.  Patient goals were partially met. Patient is being discharged due to being pleased with the current functional level.  ?????     Jeral Pinch, PT 11/23/17 4:08 PM

## 2017-10-19 NOTE — Telephone Encounter (Signed)
Pt called and stated she is going to the lab on Friday to get her blood work done and would like to have her INR added to her blood work so she can get it done all together at the lab on Friday. Thanks

## 2017-10-20 NOTE — Telephone Encounter (Signed)
Attempted to contact Pt again, still unavailable.

## 2017-10-20 NOTE — Telephone Encounter (Signed)
Lab ordered. Attempted to notify Pt- unavailable.

## 2017-10-21 ENCOUNTER — Encounter: Payer: Medicare Other | Admitting: Physical Therapy

## 2017-10-22 LAB — LIPID PANEL
Cholesterol: 160 mg/dL (ref <200)
HDL: 61 mg/dL (ref >50)
LDL Cholesterol (Calc): 84 mg/dL (calc)
Non-HDL Cholesterol (Calc): 99 mg/dL (calc) (ref <130)
Total CHOL/HDL Ratio: 2.6 (calc) (ref <5.0)
Triglycerides: 66 mg/dL (ref <150)

## 2017-10-22 LAB — CBC
HCT: 42.4 % (ref 35.0–45.0)
Hemoglobin: 14.4 g/dL (ref 11.7–15.5)
MCH: 32.5 pg (ref 27.0–33.0)
MCHC: 34 g/dL (ref 32.0–36.0)
MCV: 95.7 fL (ref 80.0–100.0)
MPV: 9.5 fL (ref 7.5–12.5)
PLATELETS: 205 10*3/uL (ref 140–400)
RBC: 4.43 10*6/uL (ref 3.80–5.10)
RDW: 12.5 % (ref 11.0–15.0)
WBC: 4.8 10*3/uL (ref 3.8–10.8)

## 2017-10-22 LAB — COMPLETE METABOLIC PANEL WITH GFR
AG RATIO: 1.7 (calc) (ref 1.0–2.5)
ALT: 16 U/L (ref 6–29)
AST: 24 U/L (ref 10–35)
Albumin: 4.3 g/dL (ref 3.6–5.1)
Alkaline phosphatase (APISO): 74 U/L (ref 33–130)
BUN: 22 mg/dL (ref 7–25)
CALCIUM: 9.3 mg/dL (ref 8.6–10.4)
CHLORIDE: 105 mmol/L (ref 98–110)
CO2: 29 mmol/L (ref 20–32)
Creat: 0.81 mg/dL (ref 0.60–0.88)
GFR, Est African American: 77 mL/min/{1.73_m2} (ref >=60)
GFR, Est Non African American: 66 mL/min/{1.73_m2} (ref >=60)
GLUCOSE: 99 mg/dL (ref 65–99)
Globulin: 2.6 g/dL (calc) (ref 1.9–3.7)
POTASSIUM: 4.4 mmol/L (ref 3.5–5.3)
Sodium: 139 mmol/L (ref 135–146)
Total Bilirubin: 0.9 mg/dL (ref 0.2–1.2)
Total Protein: 6.9 g/dL (ref 6.1–8.1)

## 2017-10-22 LAB — PROTIME-INR
INR: 2.3 — ABNORMAL HIGH
Prothrombin Time: 24.3 s — ABNORMAL HIGH (ref 9.0–11.5)

## 2017-10-25 ENCOUNTER — Other Ambulatory Visit: Payer: Self-pay

## 2017-10-25 DIAGNOSIS — I4891 Unspecified atrial fibrillation: Secondary | ICD-10-CM

## 2017-10-26 ENCOUNTER — Ambulatory Visit: Payer: Medicare Other

## 2017-11-03 ENCOUNTER — Ambulatory Visit (INDEPENDENT_AMBULATORY_CARE_PROVIDER_SITE_OTHER): Payer: Medicare Other | Admitting: Family Medicine

## 2017-11-03 ENCOUNTER — Encounter: Payer: Self-pay | Admitting: Family Medicine

## 2017-11-03 VITALS — BP 134/81 | HR 67 | Ht 63.0 in | Wt 157.0 lb

## 2017-11-03 DIAGNOSIS — I482 Chronic atrial fibrillation, unspecified: Secondary | ICD-10-CM

## 2017-11-03 DIAGNOSIS — J069 Acute upper respiratory infection, unspecified: Secondary | ICD-10-CM | POA: Diagnosis not present

## 2017-11-03 DIAGNOSIS — I4891 Unspecified atrial fibrillation: Secondary | ICD-10-CM

## 2017-11-03 IMAGING — CR DG CHEST 2V
2 series · 2 of 2 positions shown · non-contrast
Comparison: 07/03/2015

CLINICAL DATA: Preoperative examination. History of atrial
fibrillation.

EXAM:
CHEST  2 VIEW

[w chest pa]
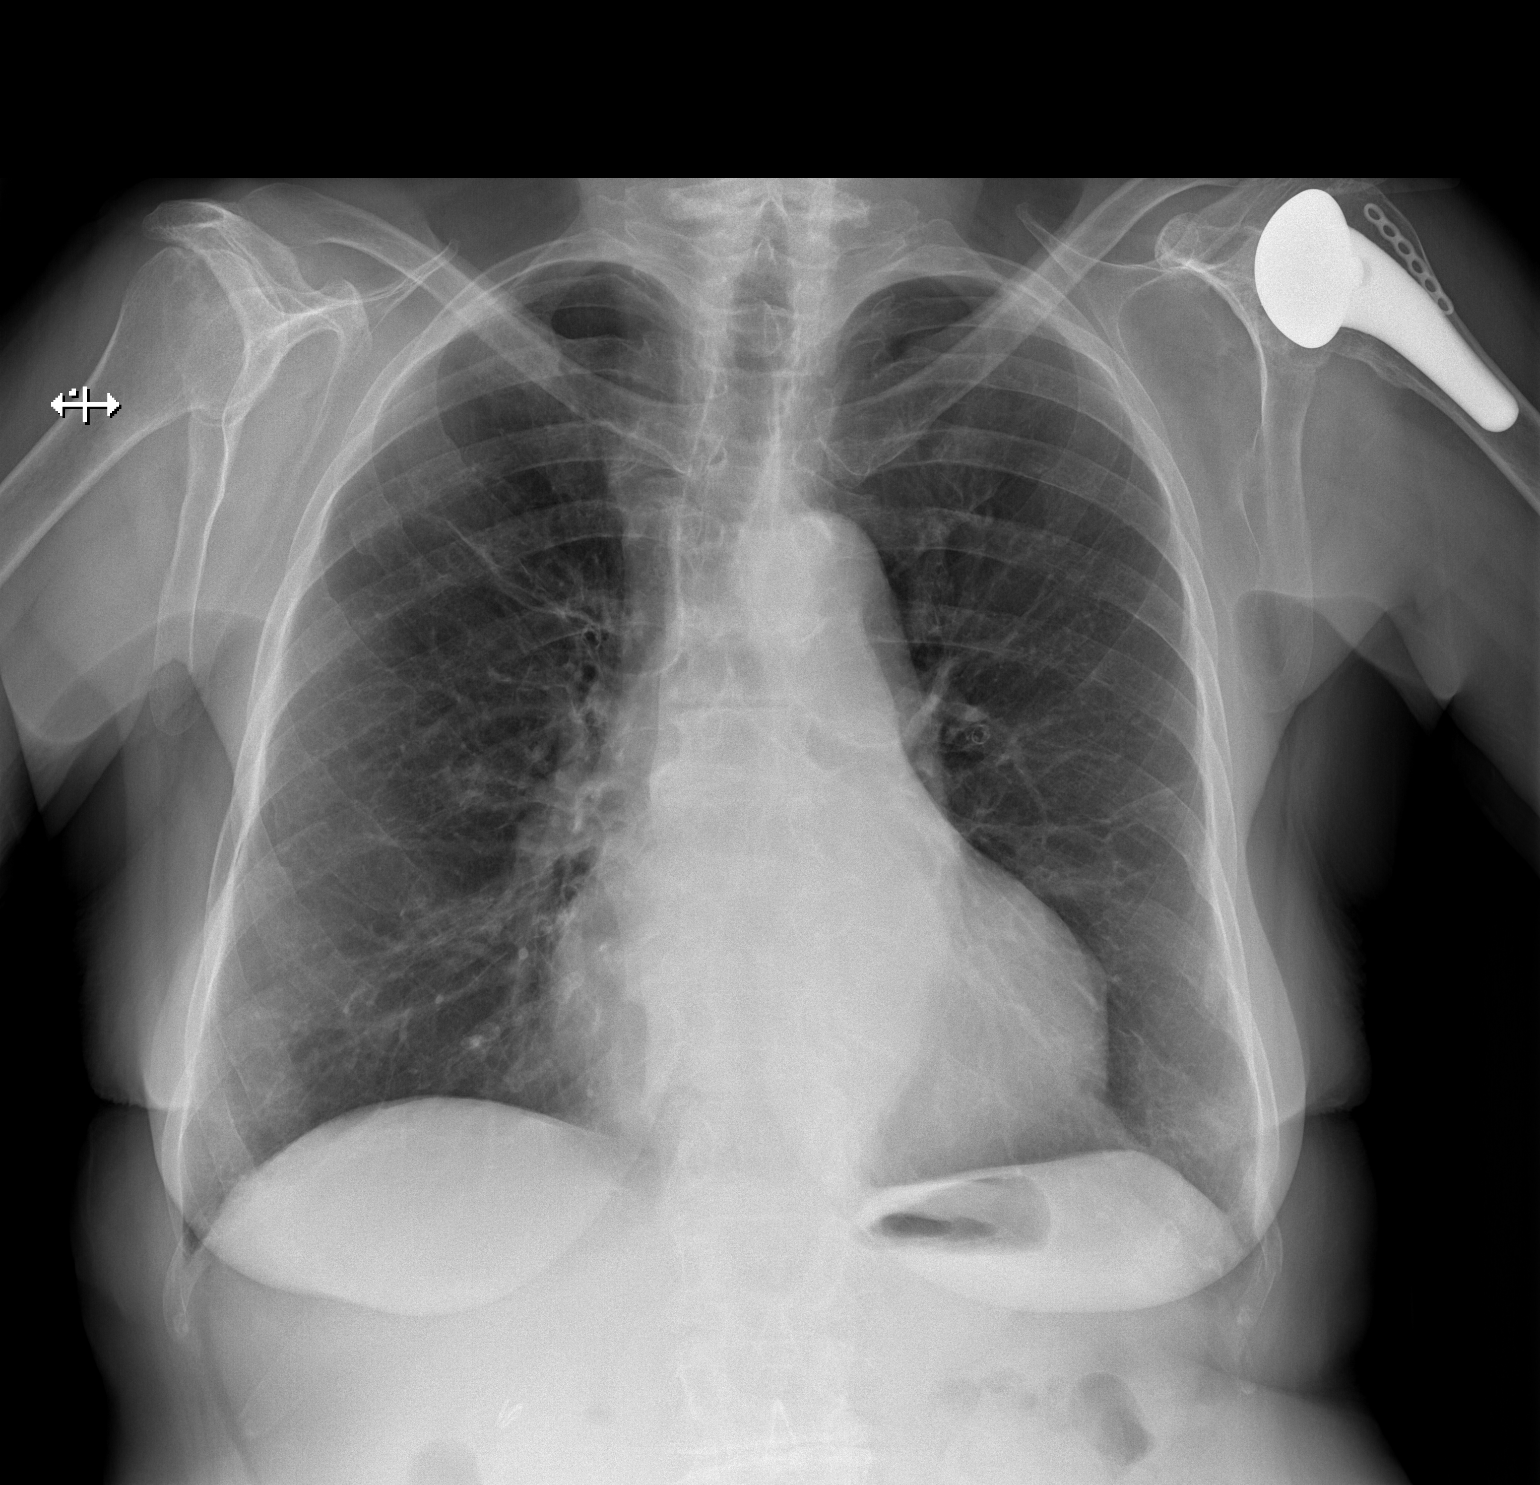

[w chest lat]
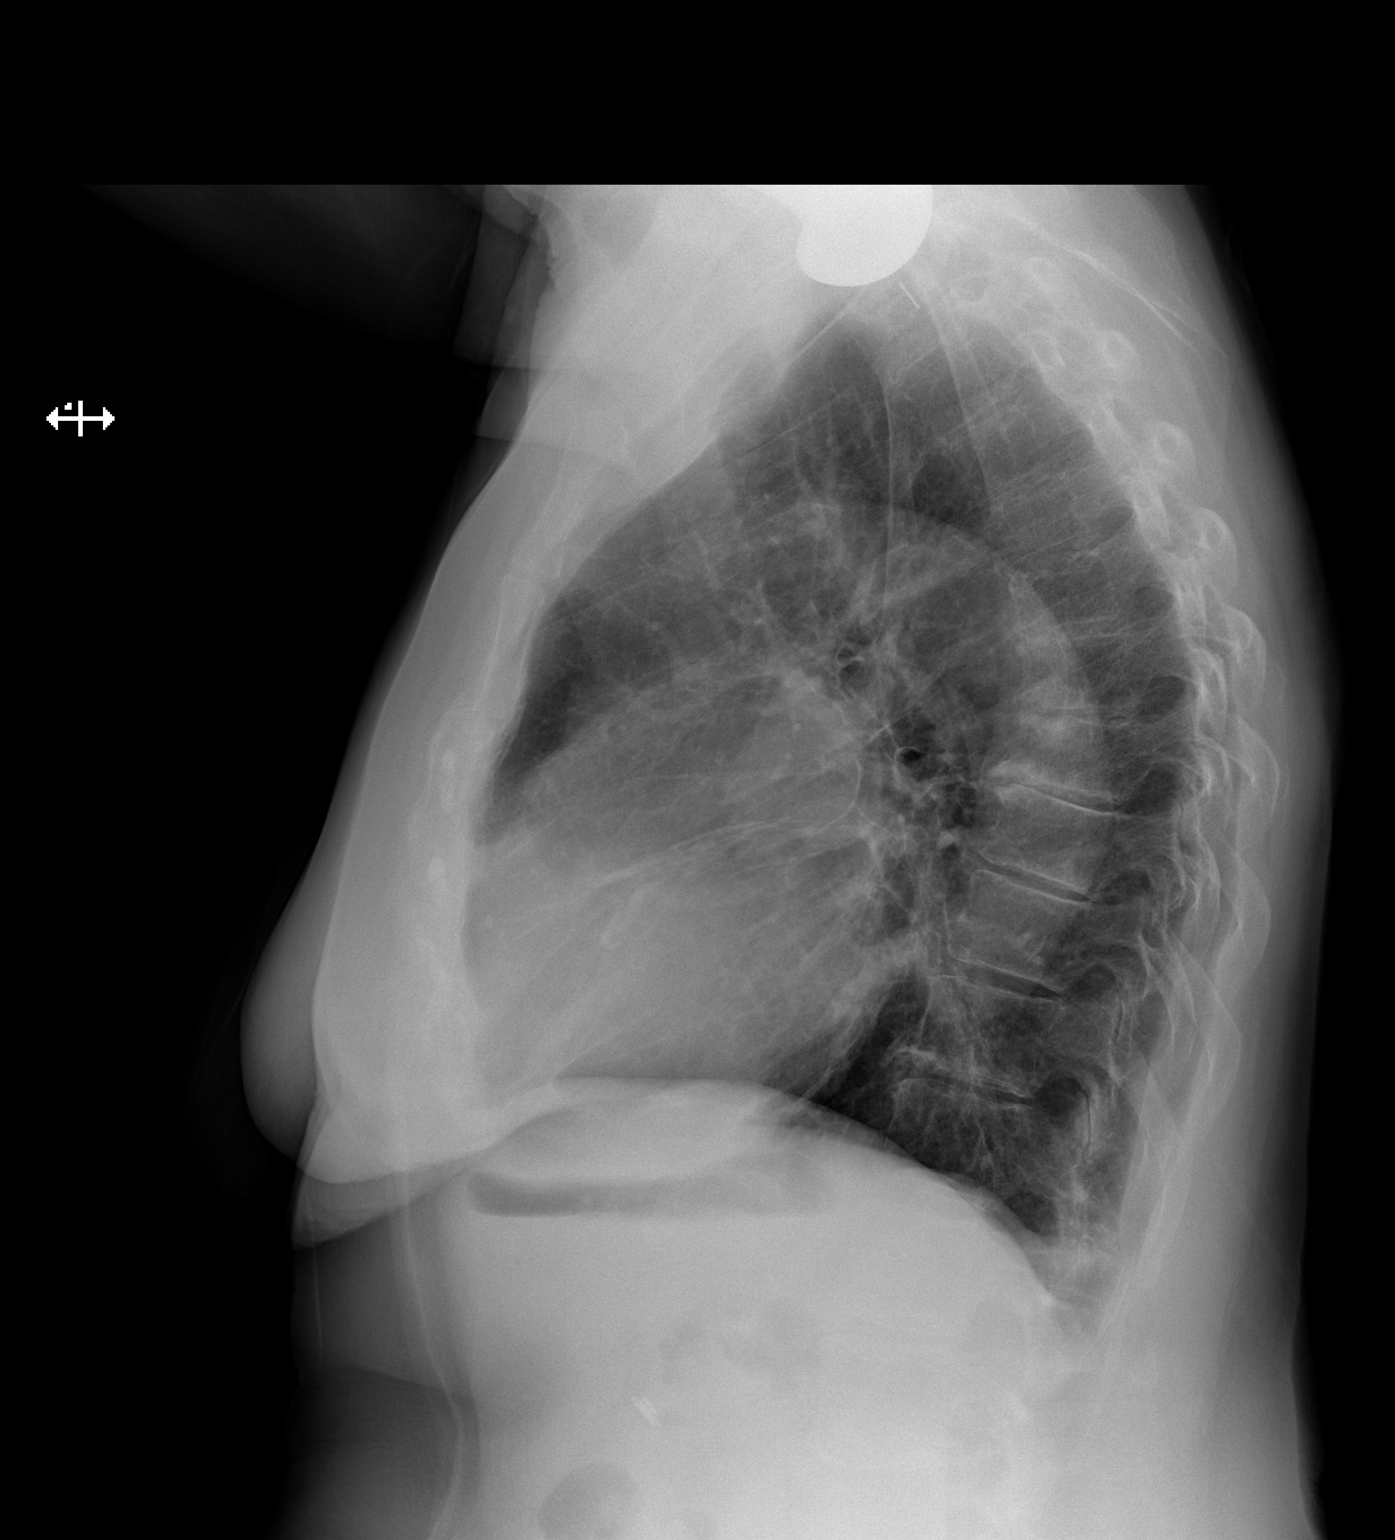

[2 of 2 positions shown; findings below may reference images not displayed]

FINDINGS: The cardiac silhouette is mildly enlarged. The aorta is torturous
and contains mild atherosclerotic calcifications. Mediastinal
contours appear intact.

There is no evidence of focal airspace consolidation, pleural
effusion or pneumothorax. Mild thickening of the interstitial
markings may represent scarring.

Osseous structures are without acute abnormality. Left humeral
prosthesis noted. Soft tissues are grossly normal.
IMPRESSION: No active cardiopulmonary disease.

Mild enlargement of the cardiac silhouette.

Atherosclerotic disease of the aorta.

Mild interstitial thickening which may represent scarring versus
mild chronic interstitial lung disease.

## 2017-11-03 MED ORDER — AMOXICILLIN-POT CLAVULANATE 875-125 MG PO TABS: 1.0000 | ORAL_TABLET | Freq: Two times a day (BID) | ORAL | 0 refills | Status: DC

## 2017-11-03 NOTE — Progress Notes (Signed)
   Subjective:    Patient ID: Terri Small, female    DOB: 09/14/1931, 84 y.o.   MRN: [id number]  HPI  Cough - x 1 day pt denies fever, sweats, chills. She will be moving and wanted to be seen prior to the move in case she cannot get in to see her new pcp.  She also has a sore throat, watery eyes and a dry cough.  She did have some aches across her shoulders this morning but has also been packing boxes to move.  No fevers chills or sweats.  No ear pain.  She did have a little diarrhea this morning.  And she is been around her daughter whom she lives with who has also had an upper respiratory infection.  Afib - She also asked that a printed lab slip for INR be given.    Review of Systems     Objective:   Physical Exam  Constitutional: She is oriented to person, place, and time. She appears well-developed and well-nourished.  HENT:  Head: Normocephalic and atraumatic.  Right Ear: External ear normal.  Left Ear: External ear normal.  Nose: Nose normal.  Mouth/Throat: Oropharynx is clear and moist.  TMs and canals are clear.   Eyes: Conjunctivae and EOM are normal. Pupils are equal, round, and reactive to light.  Neck: Neck supple. No thyromegaly present.  Cardiovascular: Normal rate, regular rhythm and normal heart sounds.  Pulmonary/Chest: Effort normal and breath sounds normal. She has no wheezes.  Lymphadenopathy:    She has no cervical adenopathy.  Neurological: She is alert and oriented to person, place, and time.  Skin: Skin is warm and dry.  Psychiatric: She has a normal mood and affect.       Assessment & Plan:  Upper respiratory infection-likely viral.  But since she is moving I did go ahead and give her a prescription for Augmentin to use if she suddenly gets worse over the weekend or if after week she is not starting to improve on her own.  She agrees to wait and only use the antibiotic if needed.  In the meantime continue symptom medic care with salt water gargles, lozenges,  humidified air, nasal saline.  Afib - Given standing order for PT/INR.

## 2018-01-29 ENCOUNTER — Other Ambulatory Visit: Payer: Self-pay | Admitting: Family Medicine

## 2018-07-24 ENCOUNTER — Telehealth: Payer: Self-pay

## 2018-07-24 NOTE — Telephone Encounter (Signed)
She is great! No additional pneumonia vaccine needed

## 2018-07-24 NOTE — Telephone Encounter (Signed)
Pt advised.

## 2018-07-24 NOTE — Telephone Encounter (Signed)
Terri Small wanted to know if she needs another Pneumonia vaccine.    Pneumococcal 13 - 10/29/2014 Pneumococcal 23 - 10/18/2012

## 2018-07-24 NOTE — Telephone Encounter (Signed)
She is not living out of town, updated pharmacy on file. As requesting - mailing her a copy of her immunization record.

## 2018-12-06 ENCOUNTER — Other Ambulatory Visit: Payer: Self-pay | Admitting: *Deleted

## 2021-04-03 LAB — BASIC METABOLIC PANEL
Creatinine: 0.9 (ref 0.5–1.1)
Sodium: 139 (ref 137–147)

## 2021-04-03 LAB — LIPID PANEL
Cholesterol: 125 (ref 0–200)
HDL: 58 (ref 35–70)
LDL Cholesterol: 54
Triglycerides: 65 (ref 40–160)

## 2021-04-03 LAB — CBC AND DIFFERENTIAL
Hemoglobin: 12.6 (ref 12.0–16.0)
Platelets: 222 10*3/uL (ref 150–400)
WBC: 5.7

## 2021-04-03 LAB — HEPATIC FUNCTION PANEL: ALT: 18 U/L (ref 7–35)

## 2021-12-15 ENCOUNTER — Ambulatory Visit (INDEPENDENT_AMBULATORY_CARE_PROVIDER_SITE_OTHER): Payer: Medicare Other | Admitting: Family Medicine

## 2021-12-15 ENCOUNTER — Encounter: Payer: Self-pay | Admitting: Family Medicine

## 2021-12-15 ENCOUNTER — Other Ambulatory Visit: Payer: Self-pay

## 2021-12-15 VITALS — BP 109/62 | Resp 18 | Ht 63.0 in | Wt 155.0 lb

## 2021-12-15 DIAGNOSIS — J22 Unspecified acute lower respiratory infection: Secondary | ICD-10-CM | POA: Diagnosis not present

## 2021-12-15 DIAGNOSIS — I4891 Unspecified atrial fibrillation: Secondary | ICD-10-CM

## 2021-12-15 DIAGNOSIS — J8410 Pulmonary fibrosis, unspecified: Secondary | ICD-10-CM

## 2021-12-15 DIAGNOSIS — Z9981 Dependence on supplemental oxygen: Secondary | ICD-10-CM

## 2021-12-15 DIAGNOSIS — J449 Chronic obstructive pulmonary disease, unspecified: Secondary | ICD-10-CM

## 2021-12-15 DIAGNOSIS — I5032 Chronic diastolic (congestive) heart failure: Secondary | ICD-10-CM | POA: Diagnosis not present

## 2021-12-15 DIAGNOSIS — J208 Acute bronchitis due to other specified organisms: Secondary | ICD-10-CM

## 2021-12-15 DIAGNOSIS — B9781 Human metapneumovirus as the cause of diseases classified elsewhere: Secondary | ICD-10-CM

## 2021-12-15 MED ORDER — FLUTICASONE FUROATE-VILANTEROL 100-25 MCG/ACT IN AEPB: 1.0000 | INHALATION_SPRAY | Freq: Every day | RESPIRATORY_TRACT | 3 refills | Status: DC

## 2021-12-15 MED ORDER — AZITHROMYCIN 250 MG PO TABS: ORAL_TABLET | ORAL | 0 refills | Status: AC

## 2021-12-15 NOTE — Patient Instructions (Addendum)
Please start the azithromycin today if you can. Call back on Thursday if you feel like we need to extend the antibiotics through the weekend. Okay to start Breo.  This is the maintenance inhaler.  You will use it once a day.  If its not covered by insurance please let me know. Okay to use the albuterol just as needed. Once we get the labs back we can see if we can decrease the furosemide.

## 2021-12-15 NOTE — Progress Notes (Signed)
Establish Care/Hospital Follow-up  Subjective:  Patient ID: Terri Small, female    DOB: 10-06-1931  Age: 88 y.o. MRN: [id number]  CC:  Chief Complaint  Patient presents with   [facility] follow up     HPI Terri Small presents for hospital follow-up and to reestablish care.  I last saw her in January 2019, she moved out of the area.  She was recently admitted at [facility] on February 21 for a COPD exacerbation with acute respiratory failure and acute on chronic heart failure secondary to metapneumovirus..  She was discharged home on February 25.  She also has a history of atrial fibrillation and had had increased shortness of breath for about a week and some cold-like symptoms.  She was previously not on oxygen but was discharged home on a continued taper of prednisone as well as home oxygen.  She is still feeling really weak and tired and sleeping a lot.  She is wearing the oxygen if she feels a little short of breath she did have 1 night where she actually slept with it on.  She is concerned because in the last several days that she started to develop black sputum.  She says it is really dark which is a little unusual.  She was only given 1 day of Levaquin in the hospital until her swab came back positive for metapneumovirus.  In regards to her heart failure she was given 40 mg of p.o. Lasix on discharge to take twice a day.  After discharge they encouraged her to follow-up in 1 week and to discuss the Lasix dosing as well as prescribing a LABA/ICS inhaler to help with breathing.  Past Medical History:  Diagnosis Date   Arthritis    "everywhere"   Atrial fibrillation ([place])    s/p 2x ablation on chronic coumadin, previously on amiodarone but had pulmonary SE.   CHF (congestive heart failure) ([place])    COPD (chronic obstructive pulmonary disease) ([place])    "pulmonologist told me last week my lungs are good" (07/10/2015)   Diverticulitis    Dysrhythmia    Fracture of  lateral malleolus 11/17/2013   Fracture, fibula 11/26/2013   Fracture, sternum closed 11/26/13   GERD (gastroesophageal reflux disease)    Headache    Herpes zoster    Osteoporosis    Shortness of breath dyspnea    Sternal fracture with retrosternal contusion 11/17/2013    Past Surgical History:  Procedure Laterality Date   ATRIAL FIBRILLATION ABLATION  ~ 2002; ~ 2004   CARDIAC CATHETERIZATION  ~ 2002; ~ 2004   COLONOSCOPY     JOINT REPLACEMENT     LAPAROSCOPIC CHOLECYSTECTOMY  ~ 2009   TOTAL HIP ARTHROPLASTY Right 07/23/2016   Procedure: RIGHT TOTAL HIP ARTHROPLASTY ANTERIOR APPROACH;  Surgeon: Frederik Pear, MD;  Location: [place];  Service: Orthopedics;  Laterality: Right;   TOTAL SHOULDER ARTHROPLASTY Left 07/10/2015   TOTAL SHOULDER ARTHROPLASTY Left 07/10/2015   Procedure: LEFT TOTAL SHOULDER ARTHROPLASTY;  Surgeon: Tania Ade, MD;  Location: [place];  Service: Orthopedics;  Laterality: Left;  Left shoulder arthroplasty   VAGINAL HYSTERECTOMY      Family History  Problem Relation Age of Onset   Stroke Father    Emphysema Father    Atrial fibrillation Unknown     Social History   Socioeconomic History   Marital status: Widowed    Spouse name: [name]   Number of children: 4   Years of education: Not  on file   Highest education level: Not on file  Occupational History   Occupation: Retired Producer, television/film/video: RETIRED  Tobacco Use   Smoking status: Former    Packs/day: 1.00    Years: 6.00    Pack years: 6.00    Types: Cigarettes    Quit date: 10/18/1960    Years since quitting: 61.2   Smokeless tobacco: Never  Substance and Sexual Activity   Alcohol use: Yes    Alcohol/week: 4.0 standard drinks    Types: 2 Glasses of wine, 2 Standard drinks or equivalent per week   Drug use: No   Sexual activity: Never  Other Topics Concern   Not on file  Social History Narrative   Retired.  Some college. Husband [name] passed away in December 14, 2013, had alzheimers.   Has 4 adult children.  Lives with her daughter and granddaughter.     Regular exercise-yes   Social Determinants of Health   Financial Resource Strain: Not on file  Food Insecurity: Not on file  Transportation Needs: Not on file  Physical Activity: Not on file  Stress: Not on file  Social Connections: Not on file  Intimate Partner Violence: Not on file    Outpatient Medications Prior to Visit  Medication Sig Dispense Refill   albuterol (VENTOLIN HFA) 108 (90 Base) MCG/ACT inhaler Inhale 2 puffs into the lungs every 4 (four) hours as needed.     apixaban (ELIQUIS) 5 MG TABS tablet Take 5 mg by mouth daily.     Cranberry 1000 MG CAPS Take by mouth in the morning and at bedtime. With D ( Uricalm)     diltiazem (CARDIZEM CD) 180 MG 24 hr capsule Take 180 mg by mouth daily.  3   furosemide (LASIX) 40 MG tablet Take 40 mg by mouth daily.     meloxicam (MOBIC) 7.5 MG tablet TAKE 1 TABLET (7.5 MG TOTAL) BY MOUTH DAILY. 30 tablet 0   metoprolol succinate (TOPROL-XL) 25 MG 24 hr tablet Take 25 mg by mouth every evening.  3   Multiple Vitamins-Minerals (PRESERVISION AREDS 2+MULTI VIT) CAPS Take 1 capsule by mouth daily. 1 capsule in the morning     Potassium Chloride (KLOR-CON PO) Take 20 mEq by mouth daily.     [START ON 12/18/2021] predniSONE (DELTASONE) 10 MG tablet Take 10 mg by mouth. Taper     PREVIDENT 5000 SENSITIVE 1.1-5 % PSTE Take 1 application by mouth at bedtime. 600 mL 1   MISC NATURAL PRODUCTS PO Take by mouth. Uricalm     warfarin (COUMADIN) 5 MG tablet TAKE 1 TABLET BY MOUTH EVERY DAY 90 tablet 2   No facility-administered medications prior to visit.    Allergies  Allergen Reactions   Nexium [Esomeprazole Magnesium] Other (See Comments)    Chest heaviness.   Sulfonamide Derivatives Other (See Comments)    REACTION: rash- burning on face   Nitrofurantoin Other (See Comments)    Pulmonary fibrosis     ROS Review of Systems    Objective:    Physical Exam Vitals reviewed.  Constitutional:       Appearance: She is well-developed.  HENT:     Head: Normocephalic and atraumatic.  Eyes:     Conjunctiva/sclera: Conjunctivae normal.  Cardiovascular:     Rate and Rhythm: Normal rate.  Pulmonary:     Effort: Pulmonary effort is normal.     Comments: Diffuse coarse breath sounds and expiratory wheezing.  No crackles. Skin:    General:  Skin is dry.  Neurological:     Mental Status: She is alert and oriented to person, place, and time.  Psychiatric:        Behavior: Behavior normal.    BP 109/62    Resp 18    Ht 5' 3"  (1.6 m)    Wt 155 lb (70.3 kg)    BMI 27.46 kg/m  Wt Readings from Last 3 Encounters:  12/15/21 155 lb (70.3 kg)  11/03/17 157 lb (71.2 kg)  10/06/17 158 lb (71.7 kg)     There are no preventive care reminders to display for this patient.   There are no preventive care reminders to display for this patient.  Lab Results  Component Value Date   TSH 1.48 12/04/2015   Lab Results  Component Value Date   WBC 4.8 10/21/2017   HGB 14.4 10/21/2017   HCT 42.4 10/21/2017   MCV 95.7 10/21/2017   PLT 205 10/21/2017   Lab Results  Component Value Date   NA 139 12/15/2021   K 3.6 12/15/2021   CO2 33 (H) 12/15/2021   GLUCOSE 171 (H) 12/15/2021   BUN 42 (H) 12/15/2021   CREATININE 1.08 (H) 12/15/2021   BILITOT 1.1 12/15/2021   ALKPHOS 73 03/25/2016   AST 17 12/15/2021   ALT 19 12/15/2021   PROT 6.3 12/15/2021   ALBUMIN 3.7 03/25/2016   CALCIUM 9.0 12/15/2021   ANIONGAP 7 07/12/2016   EGFR 49 (L) 12/15/2021   Lab Results  Component Value Date   CHOL 160 10/21/2017   Lab Results  Component Value Date   HDL 61 10/21/2017   Lab Results  Component Value Date   LDLCALC 84 10/21/2017   Lab Results  Component Value Date   TRIG 66 10/21/2017   Lab Results  Component Value Date   CHOLHDL 2.6 10/21/2017   No results found for: HGBA1C    Assessment & Plan:   Problem List Items Addressed This Visit       Cardiovascular and Mediastinum    Chronic diastolic heart failure ([place]) - Primary    She is now on her furosemide twice a day but is hopeful to taper down.  We will recheck BNP as well as BMP to see if we can start decreasing her diuretic dose.      Relevant Medications   furosemide (LASIX) 40 MG tablet   apixaban (ELIQUIS) 5 MG TABS tablet   Other Relevant Orders   B Nat Peptide (Completed)   COMPLETE METABOLIC PANEL WITH GFR (Completed)   Atrial fibrillation (HCC)    Continue Eliquis.  She does not need a refill yet.      Relevant Medications   furosemide (LASIX) 40 MG tablet   apixaban (ELIQUIS) 5 MG TABS tablet   Other Relevant Orders   B Nat Peptide (Completed)   COMPLETE METABOLIC PANEL WITH GFR (Completed)     Respiratory   Pulmonary fibrosis (HCC)    We will send over prescription for Breo to see if this is helpful.  Can still use albuterol as needed.        Other   Oxygen dependent    We will get home oxygen set up.  She has been pretty resistant to wearing it so I also think getting an overnight study would be helpful as well to see if she also needs to wear it at night not just as she feels she needs it.      Relevant Medications   AMBULATORY NON  FORMULARY MEDICATION   Other Visit Diagnoses     Chronic obstructive pulmonary disease, unspecified COPD type ([place])       Relevant Medications   predniSONE (DELTASONE) 10 MG tablet (Start on 12/18/2021)   albuterol (VENTOLIN HFA) 108 (90 Base) MCG/ACT inhaler   azithromycin (ZITHROMAX) 250 MG tablet   fluticasone furoate-vilanterol (BREO ELLIPTA) 100-25 MCG/ACT AEPB   AMBULATORY NON FORMULARY MEDICATION   Lower respiratory infection (e.g., bronchitis, pneumonia, pneumonitis, pulmonitis)       Relevant Medications   azithromycin (ZITHROMAX) 250 MG tablet   Acute bronchitis due to human metapneumovirus       Relevant Medications   azithromycin (ZITHROMAX) 250 MG tablet       Lower respiratory tract infection with metapneumovirus-I am concerned  that she is starting to develop some dark black-colored sputum in the last few days.  Some get a go ahead and cover her with azithromycin if she is not feeling better after the weekend then we will get a chest x-ray for further work-up.  She has diffuse rhonchi and wheezing on exam but I do not hear any distinct crackles.  Continue with prednisone taper she should finish up on Thursday.  Continue wearing the oxygen.  We will try to get her set up locally with someone here for home oxygen and a portable oxygen machine.  Please call back at the end of the week if she feels like she might need a little bit longer prednisone taper.  Really want her to start moving and get up and walk around a little bit more.  Encouraged her to please let me know if she feels like home health or home PT would be helpful.  Meds ordered this encounter  Medications   azithromycin (ZITHROMAX) 250 MG tablet    Sig: 2 Ttabs PO on Day 1, then one a day x 4 days.    Dispense:  6 tablet    Refill:  0   fluticasone furoate-vilanterol (BREO ELLIPTA) 100-25 MCG/ACT AEPB    Sig: Inhale 1 puff into the lungs daily.    Dispense:  3 each    Refill:  3   AMBULATORY NON FORMULARY MEDICATION    Sig: Medication Name: Home oxygen set on 2 Liters.  Also needs portable oxygen.  DX of severe COPD and recent respiratory illness.    Dispense:  1 Units    Refill:  0   AMBULATORY NON FORMULARY MEDICATION    Sig: Medication Name: overnight pulse ox.  Severe COPD.    Dispense:  1 Units    Refill:  0    Follow-up: Return in about 6 days (around 12/21/2021) for recheck her lungs on Monday .    Beatrice Lecher, MD

## 2021-12-16 ENCOUNTER — Encounter: Payer: Self-pay | Admitting: Family Medicine

## 2021-12-16 DIAGNOSIS — Z9981 Dependence on supplemental oxygen: Secondary | ICD-10-CM | POA: Insufficient documentation

## 2021-12-16 LAB — COMPLETE METABOLIC PANEL WITH GFR
AG Ratio: 1.3 (calc) (ref 1.0–2.5)
ALT: 19 U/L (ref 6–29)
AST: 17 U/L (ref 10–35)
Albumin: 3.6 g/dL (ref 3.6–5.1)
Alkaline phosphatase (APISO): 89 U/L (ref 37–153)
BUN/Creatinine Ratio: 39 (calc) — ABNORMAL HIGH (ref 6–22)
BUN: 42 mg/dL — ABNORMAL HIGH (ref 7–25)
CO2: 33 mmol/L — ABNORMAL HIGH (ref 20–32)
Calcium: 9 mg/dL (ref 8.6–10.4)
Chloride: 96 mmol/L — ABNORMAL LOW (ref 98–110)
Creat: 1.08 mg/dL — ABNORMAL HIGH (ref 0.60–0.95)
Globulin: 2.7 g/dL (calc) (ref 1.9–3.7)
Glucose, Bld: 171 mg/dL — ABNORMAL HIGH (ref 65–99)
Potassium: 3.6 mmol/L (ref 3.5–5.3)
Sodium: 139 mmol/L (ref 135–146)
Total Bilirubin: 1.1 mg/dL (ref 0.2–1.2)
Total Protein: 6.3 g/dL (ref 6.1–8.1)
eGFR: 49 mL/min/{1.73_m2} — ABNORMAL LOW (ref >=60)

## 2021-12-16 LAB — BRAIN NATRIURETIC PEPTIDE: Brain Natriuretic Peptide: 81 pg/mL (ref <100)

## 2021-12-16 MED ORDER — AMBULATORY NON FORMULARY MEDICATION: 0 refills | Status: DC

## 2021-12-16 NOTE — Progress Notes (Signed)
Call patient: Kidney function is stable at one-point.  Liver enzymes are normal.  Sodium and potassium are normal. BNP still pending.

## 2021-12-16 NOTE — Assessment & Plan Note (Signed)
She is now on her furosemide twice a day but is hopeful to taper down.  We will recheck BNP as well as BMP to see if we can start decreasing her diuretic dose. ?

## 2021-12-16 NOTE — Assessment & Plan Note (Signed)
We will get home oxygen set up.  She has been pretty resistant to wearing it so I also think getting an overnight study would be helpful as well to see if she also needs to wear it at night not just as she feels she needs it. ?

## 2021-12-16 NOTE — Progress Notes (Signed)
Call patient: BNP level is back down which is great.  So lets have her start backing off on the diuretic.  Have her alternate.  So 1 day she will take it twice a day the second day she will take it once a day in the morning and then the next day twice a day.  I would like for her to weigh herself daily and make sure that her weights are staying pretty stable as we back off.  If everything looks good over the next 10 days then we can probably back down to 1 tab daily again.

## 2021-12-16 NOTE — Assessment & Plan Note (Signed)
Continue Eliquis.  She does not need a refill yet. ?

## 2021-12-16 NOTE — Assessment & Plan Note (Signed)
We will send over prescription for Breo to see if this is helpful.  Can still use albuterol as needed. ?

## 2021-12-21 ENCOUNTER — Ambulatory Visit (INDEPENDENT_AMBULATORY_CARE_PROVIDER_SITE_OTHER): Payer: Medicare Other | Admitting: Family Medicine

## 2021-12-21 ENCOUNTER — Other Ambulatory Visit: Payer: Self-pay

## 2021-12-21 ENCOUNTER — Encounter: Payer: Self-pay | Admitting: Family Medicine

## 2021-12-21 VITALS — BP 104/54 | HR 82 | Resp 18 | Ht 63.0 in | Wt 154.0 lb

## 2021-12-21 DIAGNOSIS — J22 Unspecified acute lower respiratory infection: Secondary | ICD-10-CM | POA: Diagnosis not present

## 2021-12-21 DIAGNOSIS — I5032 Chronic diastolic (congestive) heart failure: Secondary | ICD-10-CM

## 2021-12-21 MED ORDER — CEFDINIR 300 MG PO CAPS: 300.0000 mg | ORAL_CAPSULE | Freq: Two times a day (BID) | ORAL | 0 refills | Status: DC

## 2021-12-21 NOTE — Patient Instructions (Signed)
Okay to decrease your furosemide to once a day at least until I see you back.  If your weight goes up more than 2 pounds then please take an extra pill and mark how often you are needing a second pill. ?

## 2021-12-21 NOTE — Progress Notes (Signed)
Established Patient Office Visit  Subjective:  Patient ID: Terri Small, female    DOB: 1930-12-23  Age: 88 y.o. MRN: [id number]  CC:  Chief Complaint  Patient presents with   Follow-up    Patient would like to know if she should continue with Lasix or if Torsemide will be better. Patient would like to discuss weight.    Cough    Follow up. Patient stated she still has a productive cough and would like to know if another antibiotic is needed    HPI Terri Small presents for   Follow up. Patient stated she still has a productive cough and would like to know if another antibiotic is needed.  Overall she is feeling significantly better she has not been able to walk a little further.  She really has not been using her oxygen very much.  She says the mucus is no longer black it is more of a green and gray color but she is still getting a lot of mucus.  She did finish the azithromycin.  No fevers or chills.  She wonders if she would still benefit from having continued antibiotics.  She has also completed the prednisone.  He is alternating furosemide 80 mg with 40 mg every other day.  Her dry weight at home is around 150 pounds on her home scale.  Weight has actually been stable on the decreased dose.  She has not noticed any increase in lower extremity swelling.  Is here today with her son-in-law.  Past Medical History:  Diagnosis Date   Arthritis    "everywhere"   Atrial fibrillation ([place])    s/p 2x ablation on chronic coumadin, previously on amiodarone but had pulmonary SE.   CHF (congestive heart failure) ([place])    COPD (chronic obstructive pulmonary disease) ([place])    "pulmonologist told me last week my lungs are good" (07/10/2015)   Diverticulitis    Dysrhythmia    Fracture of lateral malleolus 11/17/2013   Fracture, fibula 11/26/2013   Fracture, sternum closed 11/26/13   GERD (gastroesophageal reflux disease)    Headache    Herpes zoster    Osteoporosis    Shortness of breath dyspnea     Sternal fracture with retrosternal contusion 11/17/2013    Past Surgical History:  Procedure Laterality Date   ATRIAL FIBRILLATION ABLATION  ~ 2002; ~ 2004   CARDIAC CATHETERIZATION  ~ 2002; ~ 2004   COLONOSCOPY     JOINT REPLACEMENT     LAPAROSCOPIC CHOLECYSTECTOMY  ~ 2009   TOTAL HIP ARTHROPLASTY Right 07/23/2016   Procedure: RIGHT TOTAL HIP ARTHROPLASTY ANTERIOR APPROACH;  Surgeon: Frederik Pear, MD;  Location: [facility];  Service: Orthopedics;  Laterality: Right;   TOTAL SHOULDER ARTHROPLASTY Left 07/10/2015   TOTAL SHOULDER ARTHROPLASTY Left 07/10/2015   Procedure: LEFT TOTAL SHOULDER ARTHROPLASTY;  Surgeon: Tania Ade, MD;  Location: [facility];  Service: Orthopedics;  Laterality: Left;  Left shoulder arthroplasty   VAGINAL HYSTERECTOMY      Family History  Problem Relation Age of Onset   Stroke Father    Emphysema Father    Atrial fibrillation Unknown     Social History   Socioeconomic History   Marital status: Widowed    Spouse name: [name]   Number of children: 4   Years of education: Not on file   Highest education level: Not on file  Occupational History   Occupation: Retired Producer, television/film/video: RETIRED  Tobacco Use   Smoking status: Former  Packs/day: 1.00    Years: 6.00    Pack years: 6.00    Types: Cigarettes    Quit date: 10/18/1960    Years since quitting: 61.2   Smokeless tobacco: Never  Substance and Sexual Activity   Alcohol use: Yes    Alcohol/week: 4.0 standard drinks    Types: 2 Glasses of wine, 2 Standard drinks or equivalent per week   Drug use: No   Sexual activity: Never  Other Topics Concern   Not on file  Social History Narrative   Retired.  Some college. Husband [name] passed away in 2014/01/11, had alzheimers.   Has 4 adult children. Lives with her daughter and granddaughter.     Regular exercise-yes   Social Determinants of Health   Financial Resource Strain: Not on file  Food Insecurity: Not on file  Transportation Needs: Not on file   Physical Activity: Not on file  Stress: Not on file  Social Connections: Not on file  Intimate Partner Violence: Not on file    Outpatient Medications Prior to Visit  Medication Sig Dispense Refill   albuterol (VENTOLIN HFA) 108 (90 Base) MCG/ACT inhaler Inhale 2 puffs into the lungs every 4 (four) hours as needed.     AMBULATORY NON FORMULARY MEDICATION Medication Name: Home oxygen set on 2 Liters.  Also needs portable oxygen.  DX of severe COPD and recent respiratory illness. 1 Units 0   AMBULATORY NON FORMULARY MEDICATION Medication Name: overnight pulse ox.  Severe COPD. 1 Units 0   apixaban (ELIQUIS) 5 MG TABS tablet Take 5 mg by mouth daily.     Cranberry 1000 MG CAPS Take by mouth in the morning and at bedtime. With D ( Uricalm)     diltiazem (CARDIZEM CD) 180 MG 24 hr capsule Take 180 mg by mouth daily.  3   fluticasone furoate-vilanterol (BREO ELLIPTA) 100-25 MCG/ACT AEPB Inhale 1 puff into the lungs daily. 3 each 3   furosemide (LASIX) 40 MG tablet Take 40 mg by mouth daily.     meloxicam (MOBIC) 7.5 MG tablet TAKE 1 TABLET (7.5 MG TOTAL) BY MOUTH DAILY. 30 tablet 0   metoprolol succinate (TOPROL-XL) 25 MG 24 hr tablet Take 25 mg by mouth every evening.  3   Multiple Vitamins-Minerals (PRESERVISION AREDS 2+MULTI VIT) CAPS Take 1 capsule by mouth daily. 1 capsule in the morning     Potassium Chloride (KLOR-CON PO) Take 20 mEq by mouth daily.     PREVIDENT 5000 SENSITIVE 1.1-5 % PSTE Take 1 application by mouth at bedtime. 600 mL 1   predniSONE (DELTASONE) 10 MG tablet Take 10 mg by mouth. Taper     No facility-administered medications prior to visit.    Allergies  Allergen Reactions   Nexium [Esomeprazole Magnesium] Other (See Comments)    Chest heaviness.   Sulfonamide Derivatives Other (See Comments)    REACTION: rash- burning on face   Nitrofurantoin Other (See Comments)    Pulmonary fibrosis     ROS Review of Systems    Objective:    Physical  Exam Constitutional:      Appearance: Normal appearance. She is well-developed.  HENT:     Head: Normocephalic and atraumatic.  Cardiovascular:     Rate and Rhythm: Normal rate and regular rhythm.     Heart sounds: Normal heart sounds.  Pulmonary:     Effort: Pulmonary effort is normal.     Breath sounds: Normal breath sounds.     Comments: Rhonchi at the  bases bilaterally. Skin:    General: Skin is warm and dry.  Neurological:     Mental Status: She is alert and oriented to person, place, and time.  Psychiatric:        Behavior: Behavior normal.    BP (!) 104/54    Pulse 82    Resp 18    Ht 5' 3"  (1.6 m)    Wt 154 lb (69.9 kg)    SpO2 95%    BMI 27.28 kg/m  Wt Readings from Last 3 Encounters:  12/21/21 154 lb (69.9 kg)  12/15/21 155 lb (70.3 kg)  11/03/17 157 lb (71.2 kg)     There are no preventive care reminders to display for this patient.  There are no preventive care reminders to display for this patient.  Lab Results  Component Value Date   TSH 1.48 12/04/2015   Lab Results  Component Value Date   WBC 4.8 10/21/2017   HGB 14.4 10/21/2017   HCT 42.4 10/21/2017   MCV 95.7 10/21/2017   PLT 205 10/21/2017   Lab Results  Component Value Date   NA 139 12/15/2021   K 3.6 12/15/2021   CO2 33 (H) 12/15/2021   GLUCOSE 171 (H) 12/15/2021   BUN 42 (H) 12/15/2021   CREATININE 1.08 (H) 12/15/2021   BILITOT 1.1 12/15/2021   ALKPHOS 73 03/25/2016   AST 17 12/15/2021   ALT 19 12/15/2021   PROT 6.3 12/15/2021   ALBUMIN 3.7 03/25/2016   CALCIUM 9.0 12/15/2021   ANIONGAP 7 07/12/2016   EGFR 49 (L) 12/15/2021   Lab Results  Component Value Date   CHOL 160 10/21/2017   Lab Results  Component Value Date   HDL 61 10/21/2017   Lab Results  Component Value Date   LDLCALC 84 10/21/2017   Lab Results  Component Value Date   TRIG 66 10/21/2017   Lab Results  Component Value Date   CHOLHDL 2.6 10/21/2017   No results found for: HGBA1C    Assessment &  Plan:   Problem List Items Addressed This Visit       Cardiovascular and Mediastinum   Chronic diastolic heart failure ([place]) - Primary    We will go ahead and decrease the furosemide down to 40 mg daily.  But encouraged her to keep a really close eye on her daily weight on her home scale.  Her dry weight on her home scale is around 150.  She also reports that her appetite is really picking back up and she is feeling better.  If her weight goes up more than 2 pounds that I would like for her to take an extra furosemide and document how often she is actually using it.      Other Visit Diagnoses     Lower respiratory infection (e.g., bronchitis, pneumonia, pneumonitis, pulmonitis)       Relevant Medications   cefdinir (OMNICEF) 300 MG capsule       Lower respiratory tract infection-I do think overall she is improving and her chest does sound better but she still has some rhonchi at the bases bilaterally.  Actually can go ahead and put her on about 5 days of Omnicef at that point if we feel like we need to continue antibiotics then we will get a chest x-ray.  But actually do feel reassured that she really is getting better.  Just encouraged her to continue to get up and move and walk.  Hypotension-blood pressure is a little bit on  the low end today but I am hoping as we are backing off on her diuretic that it will actually improve.  If not then we can take a look at reducing her Cardizem.  Meds ordered this encounter  Medications   cefdinir (OMNICEF) 300 MG capsule    Sig: Take 1 capsule (300 mg total) by mouth 2 (two) times daily.    Dispense:  10 capsule    Refill:  0    Follow-up: Return in about 3 months (around 03/23/2022).    Beatrice Lecher, MD

## 2021-12-21 NOTE — Assessment & Plan Note (Signed)
We will go ahead and decrease the furosemide down to 40 mg daily.  But encouraged her to keep a really close eye on her daily weight on her home scale.  Her dry weight on her home scale is around 150.  She also reports that her appetite is really picking back up and she is feeling better.  If her weight goes up more than 2 pounds that I would like for her to take an extra furosemide and document how often she is actually using it. ?

## 2021-12-23 ENCOUNTER — Other Ambulatory Visit: Payer: Self-pay

## 2021-12-23 ENCOUNTER — Encounter: Payer: Self-pay | Admitting: Family Medicine

## 2021-12-23 DIAGNOSIS — I350 Nonrheumatic aortic (valve) stenosis: Secondary | ICD-10-CM | POA: Insufficient documentation

## 2021-12-23 DIAGNOSIS — J449 Chronic obstructive pulmonary disease, unspecified: Secondary | ICD-10-CM

## 2021-12-23 MED ORDER — AMBULATORY NON FORMULARY MEDICATION: 0 refills | Status: DC

## 2022-01-04 ENCOUNTER — Other Ambulatory Visit: Payer: Self-pay | Admitting: *Deleted

## 2022-01-04 DIAGNOSIS — Z9981 Dependence on supplemental oxygen: Secondary | ICD-10-CM

## 2022-01-04 MED ORDER — AMBULATORY NON FORMULARY MEDICATION: 0 refills | Status: DC

## 2022-01-26 ENCOUNTER — Telehealth: Payer: Self-pay | Admitting: Family Medicine

## 2022-01-26 NOTE — Telephone Encounter (Signed)
Please call patient and let her know I apologize for the delay I have been out of the office for about a week and a half.  I did receive her test results for her overnight oxygen.  She did drop down to 71% on her oxygen overnight that was the lowest.  With her baseline being around 90.  Her oxygen dropped 416 times overnight.  The recommendation is to be on oxygen if it drops to 88 or less.  And she was at 88 or less for almost 3 hours overnight which is pretty significant.  So she absolutely, absolutely, needs to be wearing her oxygen at night while she is sleeping. ?

## 2022-01-26 NOTE — Telephone Encounter (Signed)
Spoke with pt's daughter and gave her the results of the O2 study. She stated that she will inform her mother of these results and make sure that she understands that she ABSOLUTELY has to be on the oxygen at night. ? ?While speaking with her she asked about the portable O2 I informed her that the order for that was placed on 3/8 and wasn't sure why they haven't gotten back with her about this. She stated that she would call them and if she had anymore issues that she would call us back.  ?

## 2022-01-27 ENCOUNTER — Telehealth: Payer: Self-pay | Admitting: Family Medicine

## 2022-01-27 ENCOUNTER — Other Ambulatory Visit: Payer: Self-pay | Admitting: *Deleted

## 2022-01-27 ENCOUNTER — Encounter: Payer: Self-pay | Admitting: Family Medicine

## 2022-01-27 DIAGNOSIS — Z8781 Personal history of (healed) traumatic fracture: Secondary | ICD-10-CM | POA: Insufficient documentation

## 2022-01-27 MED ORDER — SOD FLUORIDE-POTASSIUM NITRATE 1.1-5 % DT PSTE: 1.0000 "application " | PASTE | Freq: Every day | DENTAL | 6 refills | Status: DC

## 2022-01-27 NOTE — Telephone Encounter (Signed)
Please call her daughter Amethyst and let her know in going through her records from [facility] she did have a CT of her chest on October 23, 2020.  They did note that she had some mildly enlarged lymph nodes in the middle of her chest.  They had recommended a possible follow-up CT in 6 months.  I did not see any additional CT scans on the record this was the only one that they faxed to [place].  Would she like to consider having that updated?   ?

## 2022-01-28 NOTE — Telephone Encounter (Signed)
Patient's daughter advised of message and verbalized understanding. Patient's daughter discussed decision of repeat CT scan with patient. Patient's daughter stated patient would like to wait until her appointment in 03/2022 to discuss with Dr. Madilyn Fireman.  ?

## 2022-02-01 ENCOUNTER — Other Ambulatory Visit: Payer: Self-pay

## 2022-02-01 ENCOUNTER — Encounter: Payer: Self-pay | Admitting: Family Medicine

## 2022-02-01 LAB — BRAIN NATRIURETIC PEPTIDE: B Natriuretic Peptide: 198.9

## 2022-03-02 ENCOUNTER — Telehealth: Payer: Self-pay

## 2022-03-02 ENCOUNTER — Ambulatory Visit: Payer: Medicare Other | Admitting: Family Medicine

## 2022-03-02 ENCOUNTER — Telehealth: Payer: Self-pay | Admitting: Family Medicine

## 2022-03-02 DIAGNOSIS — R051 Acute cough: Secondary | ICD-10-CM

## 2022-03-02 DIAGNOSIS — J449 Chronic obstructive pulmonary disease, unspecified: Secondary | ICD-10-CM

## 2022-03-02 DIAGNOSIS — I5032 Chronic diastolic (congestive) heart failure: Secondary | ICD-10-CM

## 2022-03-02 DIAGNOSIS — J8410 Pulmonary fibrosis, unspecified: Secondary | ICD-10-CM

## 2022-03-02 DIAGNOSIS — I4891 Unspecified atrial fibrillation: Secondary | ICD-10-CM

## 2022-03-02 MED ORDER — BENZONATATE 200 MG PO CAPS: 200.0000 mg | ORAL_CAPSULE | Freq: Two times a day (BID) | ORAL | 0 refills | Status: DC | PRN

## 2022-03-02 NOTE — Telephone Encounter (Signed)
Spoke with patient's daughter Saprina and scheduled a Mychart Palliative Consult for 03/08/22 @ 2:30 PM.  ? ?Consent obtained; updated Netsmart, Team List and Epic.  ? ?

## 2022-03-02 NOTE — Telephone Encounter (Signed)
Patient's daughter was seen in the office today and reports that she has really declined in the last several weeks.  She is not wanting to go to the hospital and wants to be at home.  She is requesting a consultation with palliative care and/or hospice.  We will go ahead and place referral today.  She is also had increased in cough.  She did increase her Lasix to see if that would help since she is also noticed a little bit more swelling.  She has had to wear her oxygen 24/7 now.  She just feels like she is really declining fairly rapidly over the last several weeks.  She does not want any major interventions done.  She is a DNR.  Ladona Ridgel sent to pharmacy. ?

## 2022-03-08 ENCOUNTER — Other Ambulatory Visit: Payer: Self-pay | Admitting: *Deleted

## 2022-03-08 ENCOUNTER — Encounter: Payer: Self-pay | Admitting: Family Medicine

## 2022-03-08 ENCOUNTER — Telehealth: Payer: Medicare Other | Admitting: Family Medicine

## 2022-03-08 DIAGNOSIS — I5032 Chronic diastolic (congestive) heart failure: Secondary | ICD-10-CM

## 2022-03-08 DIAGNOSIS — Z515 Encounter for palliative care: Secondary | ICD-10-CM

## 2022-03-08 DIAGNOSIS — J8410 Pulmonary fibrosis, unspecified: Secondary | ICD-10-CM

## 2022-03-08 MED ORDER — FUROSEMIDE 40 MG PO TABS: 40.0000 mg | ORAL_TABLET | Freq: Every day | ORAL | 3 refills | Status: DC

## 2022-03-08 NOTE — Progress Notes (Signed)
[facility] Consult Note Telephone: [phone]  Fax: [phone]   Date of encounter: 03/08/22 2:45PM PATIENT NAME: Terri Small [address] [place] [address]   [id number] (home)  DOB: 01-Jan-1931 MRN: [id number] PRIMARY CARE PROVIDER:    Hali Marry, MD,  [place] [address] [phone]  REFERRING PROVIDER:   Hali Marry, MD [place],  [place] [address] [phone]  RESPONSIBLE PARTY:    Contact Information     Name Relation Home Work [place] Daughter   [phone]      I connected with  Terri Small and  her daughter on 03/08/22 by a video enabled telemedicine application but was unable to open visit and connect sound so verified that I am speaking with the correct person using two identifiers.   I discussed the limitations of evaluation and management by telemedicine. The patient expressed understanding and agreed to proceed.   Palliative Care was asked to follow this patient by consultation request of  Hali Marry, * to address advance care planning and complex medical decision making. This is the initial visit.          ASSESSMENT, SYMPTOM MANAGEMENT AND PLAN / RECOMMENDATIONS:     Follow up Palliative Care Visit: Palliative care will continue to follow for complex medical decision making, advance care planning, and clarification of goals. Return *** weeks or prn.    This visit was coded based on medical decision making (MDM).***  PPS: ***0%  HOSPICE ELIGIBILITY/DIAGNOSIS: TBD  Chief Complaint: ***  HISTORY OF PRESENT ILLNESS:  Dejah Droessler is a 88 y.o. year old female  with *** .   History obtained from review of EMR, discussion with primary team, and interview with family, facility staff/caregiver and/or Terri Small.  I reviewed available labs, medications, imaging, studies and related documents from  the EMR.  Records reviewed and summarized above.   ROS General: NAD EYES: denies vision changes ENMT: denies dysphagia Cardiovascular: denies chest pain, denies DOE Pulmonary: denies cough, denies increased SOB Abdomen: endorses good appetite, denies constipation, endorses continence of bowel GU: denies dysuria, endorses continence of urine MSK:  denies increased weakness, no falls reported Skin: denies rashes or wounds Neurological: denies pain, denies insomnia Psych: Endorses positive mood Heme/lymph/immuno: denies bruises, abnormal bleeding  Physical Exam: Current and past weights: Constitutional: NAD General: frail appearing, thin/WNWD/obese  EYES: anicteric sclera, lids intact, no discharge  ENMT: intact hearing, oral mucous membranes moist, dentition intact CV: S1S2, RRR, no LE edema Pulmonary: CTAB, no increased work of breathing, no cough, room air Abdomen: intake 100%, normo-active BS + 4 quadrants, soft and non tender, no ascites GU: deferred MSK: no sarcopenia, moves all extremities, ambulatory Skin: warm and dry, no rashes or wounds on visible skin Neuro:  no generalized weakness, no cognitive impairment Psych: non-anxious affect, A and O x 3 Hem/lymph/immuno: no widespread bruising  CURRENT PROBLEM LIST:  Patient Active Problem List   Diagnosis Date Noted   History of vertebral compression fracture - T3 01/27/2022   Aortic stenosis, moderate 12/23/2021   Oxygen dependent 12/16/2021   Combined forms of age-related cataract of left eye 05/12/2017   Arthritis of right hip 07/23/2016   Primary osteoarthritis of right hip 07/22/2016    Class: Chronic   Squamous cell skin cancer 03/22/2016   Foot pain, bilateral 12/10/2015   Glenohumeral arthritis 07/10/2015   Fever blister 11/06/2014  Chronic diastolic heart failure ([place]) 12/25/2013   Cardiomyopathy ([place]) 12/21/2013   Pulmonary fibrosis ([place]) 10/16/2013   TR (tricuspid regurgitation) 10/16/2013   Congestive  heart failure with left ventricular diastolic dysfunction ([place]) 08/27/2013   Secondary pulmonary hypertension 08/27/2013   CAFL (chronic airflow limitation) ([place]) 08/16/2013   Acid reflux 08/16/2013   MI (mitral incompetence) 06/30/2011   History of recurrent UTI (urinary tract infection) 04/21/2010   Atrial fibrillation ([place]) 05/13/2009   OSTEOPOROSIS 05/13/2009   HERPES ZOSTER 02/28/2009   PAST MEDICAL HISTORY:  Active Ambulatory Problems    Diagnosis Date Noted   HERPES ZOSTER 02/28/2009   Atrial fibrillation ([place]) 05/13/2009   History of recurrent UTI (urinary tract infection) 04/21/2010   OSTEOPOROSIS 05/13/2009   Congestive heart failure with left ventricular diastolic dysfunction ([place]) 08/27/2013   Secondary pulmonary hypertension 08/27/2013   Pulmonary fibrosis ([place]) 10/16/2013   TR (tricuspid regurgitation) 10/16/2013   Chronic diastolic heart failure ([place]) 12/25/2013   Cardiomyopathy ([place]) 12/21/2013   CAFL (chronic airflow limitation) ([facility]) 08/16/2013   Acid reflux 08/16/2013   MI (mitral incompetence) 06/30/2011   Fever blister 11/06/2014   Glenohumeral arthritis 07/10/2015   Foot pain, bilateral 12/10/2015   Squamous cell skin cancer 03/22/2016   Primary osteoarthritis of right hip 07/22/2016   Arthritis of right hip 07/23/2016   Combined forms of age-related cataract of left eye 05/12/2017   Oxygen dependent 12/16/2021   Aortic stenosis, moderate 12/23/2021   History of vertebral compression fracture - T3 01/27/2022   Resolved Ambulatory Problems    Diagnosis Date Noted   OTH NONSPC ABN FINDNG RAD&OTH EXM BODY STRUCTURE 09/28/2010   Postinflammatory pulmonary fibrosis ([place]) 07/03/2013   Fracture, fibula 11/26/2013   Fracture, sternum closed 11/26/2013   Chest pain 09/07/2013   Accumulation of fluid in tissues 02/01/2014   Breath shortness 02/01/2014   Fracture of lateral malleolus 11/17/2013   Dysuria 11/06/2014   Upper airway cough syndrome 07/03/2015    Postoperative hypotension 07/12/2015   Skin lesion of left arm 03/22/2016   SOB (shortness of breath) 02/01/2014   Sternal fracture with retrosternal contusion 11/17/2013   Past Medical History:  Diagnosis Date   Arthritis    CHF (congestive heart failure) ([facility])    COPD (chronic obstructive pulmonary disease) ([facility])    Diverticulitis    Dysrhythmia    GERD (gastroesophageal reflux disease)    Headache    Herpes zoster    Osteoporosis    Shortness of breath dyspnea    SOCIAL HX:  Social History   Tobacco Use   Smoking status: Former    Packs/day: 1.00    Years: 6.00    Pack years: 6.00    Types: Cigarettes    Quit date: 10/18/1960    Years since quitting: 61.4   Smokeless tobacco: Never  Substance Use Topics   Alcohol use: Yes    Alcohol/week: 4.0 standard drinks    Types: 2 Glasses of wine, 2 Standard drinks or equivalent per week   FAMILY HX:  Family History  Problem Relation Age of Onset   Stroke Father    Emphysema Father    Atrial fibrillation Unknown     ***   Preferred Pharmacy: ALLERGIES:  Allergies  Allergen Reactions   Nexium [Esomeprazole Magnesium] Other (See Comments)    Chest heaviness.   Sulfonamide Derivatives Other (See Comments)    REACTION: rash- burning on face   Nitrofurantoin Other (See Comments)    Pulmonary fibrosis  PERTINENT MEDICATIONS:  Outpatient Encounter Medications as of 03/08/2022  Medication Sig   albuterol (VENTOLIN HFA) 108 (90 Base) MCG/ACT inhaler Inhale 2 puffs into the lungs every 4 (four) hours as needed.   AMBULATORY NON FORMULARY MEDICATION Medication Name: Home oxygen set on 2 Liters.  Also needs portable oxygen.  DX of severe COPD and recent respiratory illness.   AMBULATORY NON FORMULARY MEDICATION Medication Name: overnight pulse ox Room Air.  Severe COPD.   apixaban (ELIQUIS) 5 MG TABS tablet Take 5 mg by mouth daily.   benzonatate (TESSALON) 200 MG capsule Take 1 capsule (200 mg total) by mouth 2 (two) times  daily as needed for cough.   Cranberry 1000 MG CAPS Take by mouth in the morning and at bedtime. With D ( Uricalm)   diltiazem (CARDIZEM CD) 180 MG 24 hr capsule Take 180 mg by mouth daily.   fluticasone furoate-vilanterol (BREO ELLIPTA) 100-25 MCG/ACT AEPB Inhale 1 puff into the lungs daily.   meloxicam (MOBIC) 7.5 MG tablet TAKE 1 TABLET (7.5 MG TOTAL) BY MOUTH DAILY.   metoprolol succinate (TOPROL-XL) 25 MG 24 hr tablet Take 25 mg by mouth every evening.   Multiple Vitamins-Minerals (PRESERVISION AREDS 2+MULTI VIT) CAPS Take 1 capsule by mouth daily. 1 capsule in the morning   Potassium Chloride (KLOR-CON PO) Take 20 mEq by mouth daily.   Sod Fluoride-Potassium Nitrate 1.1-5 % PSTE Place 1 application. onto teeth at bedtime.   [DISCONTINUED] furosemide (LASIX) 40 MG tablet Take 40 mg by mouth daily.   No facility-administered encounter medications on file as of 03/08/2022.     -------------------------------------------------------------------------------------------------------------------------------------------------------------------------------------------------------------------------------------------- Advance Care Planning/Goals of Care: Goals include to maximize quality of life and symptom management. Patient/health care surrogate gave his/her permission to discuss.Our advance care planning conversation included a discussion about:    The value and importance of advance care planning  Experiences with loved ones who have been seriously ill or have died  Exploration of personal, cultural or spiritual beliefs that might influence medical decisions  Exploration of goals of care in the event of a sudden injury or illness  Identification  of a healthcare agent  Review and updating or creation of an  advance directive document . Decision not to resuscitate or to de-escalate disease focused treatments due to poor prognosis. CODE STATUS:  I spent *** minutes providing this consultation  providing Palliative Care counseling on goals of care. More than 50% of the time in this consultation was spent in counseling and care coordination.   Thank you for the opportunity to participate in the care of Terri Small.  The palliative care team will continue to follow. Please call our office at [phone] if we can be of additional assistance.   Marijo Conception, FNP-C  COVID-19 PATIENT SCREENING TOOL Asked and negative response unless otherwise noted:  Have you had symptoms of covid, tested positive or been in contact with someone with symptoms/positive test in the past 5-10 days?

## 2022-03-09 ENCOUNTER — Encounter: Payer: Self-pay | Admitting: Family Medicine

## 2022-03-09 DIAGNOSIS — Z515 Encounter for palliative care: Secondary | ICD-10-CM | POA: Insufficient documentation

## 2022-03-17 ENCOUNTER — Other Ambulatory Visit: Payer: Medicare Other | Admitting: Family Medicine

## 2022-03-17 VITALS — BP 130/80 | HR 56 | Resp 20

## 2022-03-17 DIAGNOSIS — I5032 Chronic diastolic (congestive) heart failure: Secondary | ICD-10-CM

## 2022-03-17 DIAGNOSIS — Z515 Encounter for palliative care: Secondary | ICD-10-CM

## 2022-03-17 NOTE — Progress Notes (Unsigned)
[facility] Consult Note Telephone: [phone]  Fax: [phone]    Date of encounter: 03/17/22 11:20 AM PATIENT NAME: Terri Small [address] [place] [address]   [phone] (home)  DOB: 21-Jul-1931 MRN: [id number] PRIMARY CARE PROVIDER:    Hali Marry, MD,  [place] [address] [phone]  REFERRING PROVIDER:   Hali Marry, MD [place],  [place] [address] [phone]  RESPONSIBLE PARTY:    Contact Information     Name Relation Home Work [place] Daughter   [phone]        I met face to face with patient and family in their home. Palliative Care was asked to follow this patient by consultation request of  Hali Marry, * to address advance care planning and complex medical decision making. This is a follow up visit.                                   ASSESSMENT, SYMPTOM MANAGEMENT AND PLAN / RECOMMENDATIONS:   Palliative Care Encounter Discussed living will and goals of care, MOST form. Pt and primary caregiver will discuss, follow up at next visit.  2.  Chronic Diastolic heart failure Stable with increased exertional fatigue Encouraged to continue taking 2nd diuretic in early afternoon when needed Discussed methods for energy conservation (having someone lay out clothes before shower, seated dressing, use of O2 before and after activities that are fatiguing, organizing/spacing out activities that are more taxing to different days or after rest periods.  Advance Care Planning/Goals of Care: Goals include to maximize quality of life and symptom management. Patient and health care surrogate gave their permission to discuss. Our advance care planning conversation included a discussion about:    The value and importance of advance care planning  Experiences with loved ones who have been seriously ill or have  died-patient's spouse was with Hospice and experience was positive-wants to remain in her home and comfortable Exploration of personal, cultural or spiritual beliefs that might influence medical decisions-has multiple family members, who are in the medical field-a PT and an RT. Wants guidance when goals should be shifted to Hospice Exploration of goals of care in the event of a sudden injury or illness-wants to think about her choices Identification of a healthcare agent-daughter Irina Review  of an advance directive document-MOST  CODE STATUS: DNR/DNI    Follow up Palliative Care Visit: Palliative care will continue to follow for complex medical decision making, advance care planning, and clarification of goals. Return 4 weeks or prn.   This visit was coded based on medical decision making (MDM).  PPS: 70%  HOSPICE ELIGIBILITY/DIAGNOSIS: TBD  Chief Complaint:  Palliative Care is following up for chronic medical management in setting of pulmonary fibrosis, pulmonary hypertension and chronic CHF. Palliative Care is also following to assist with advance care planning and defining/refining goals of care.  HISTORY OF PRESENT ILLNESS:  Terri Small is a 88 y.o. year old female with COPD, pulmonary fibrosis, pulmonary hypertension, chronic heart failure, Valvular heart disease (tricuspid regurgitation, moderate aortic stenosis and mitral incompetence), atrial fibrillation, osteoarthritis,  and hx of T3 vertebral compression fracture.  Appetite has changed and losing weight, taste is changing.  Changed dry weight to 151 lbs.  Not taking Lasix 2nd dose every day but has helped sleep when  has to take 2nd dose by taking in early afternoon around 2 pm. Can lay flat and wears O2 at night.  Showers independently. No falls. No dysuria or constipation.   History obtained from review of EMR, discussion with family and/or Ms. Schlereth.  I reviewed available labs, medications, imaging, studies and related  documents from the EMR.  Records reviewed and summarized above.   ROS General: NAD,easier to become fatigued EYES: denies vision changes ENMT: denies dysphagia Cardiovascular: denies chest pain, endorses some increase in DOE Pulmonary: denies cough, denies increased SOB Abdomen: endorses good appetite but less intake, denies constipation, endorses continence of bowel GU: denies dysuria, endorses continence of urine MSK:  denies increased weakness, endorses some decline in endurance, no falls reported Skin: denies rashes or wounds Neurological: denies pain, denies insomnia Psych: Endorses positive mood Heme/lymph/immuno: denies bruises, abnormal bleeding  Physical Exam: Current and past weights: 149 lbs on home scale (uses 151 lbs as dry weight goal) Constitutional: NAD General: WNWD  EYES: anicteric sclera, lids intact, no discharge  ENMT: intact hearing, oral mucous membranes moist, dentition intact CV: S1S2, IRIR, trace LLE edema, none on right Pulmonary: CTAB, no increased work of breathing, no cough, room air except at night Abdomen: normo-active BS + 4 quadrants, soft and non tender, no ascites GU: deferred MSK: no sarcopenia, moves all extremities, ambulatory Skin: warm and dry, no rashes or wounds on visible skin Neuro:  no generalized weakness,  no cognitive impairment Psych: non-anxious affect, A and O x 3 Hem/lymph/immuno: no widespread bruising   Thank you for the opportunity to participate in the care of Ms. Waring.  The palliative care team will continue to follow. Please call our office at [phone] if we can be of additional assistance.   Marijo Conception, FNP-C  COVID-19 PATIENT SCREENING TOOL Asked and negative response unless otherwise noted:   Have you had symptoms of covid, tested positive or been in contact with someone with symptoms/positive test in the past 5-10 days?  No

## 2022-03-20 ENCOUNTER — Encounter: Payer: Self-pay | Admitting: Family Medicine

## 2022-03-22 ENCOUNTER — Encounter: Payer: Self-pay | Admitting: Family Medicine

## 2022-03-23 ENCOUNTER — Encounter: Payer: Self-pay | Admitting: Family Medicine

## 2022-03-23 ENCOUNTER — Ambulatory Visit (INDEPENDENT_AMBULATORY_CARE_PROVIDER_SITE_OTHER): Payer: Medicare Other | Admitting: Family Medicine

## 2022-03-23 VITALS — BP 106/66 | HR 64 | Resp 18 | Ht 63.0 in | Wt 152.0 lb

## 2022-03-23 DIAGNOSIS — R051 Acute cough: Secondary | ICD-10-CM | POA: Diagnosis not present

## 2022-03-23 DIAGNOSIS — R300 Dysuria: Secondary | ICD-10-CM

## 2022-03-23 DIAGNOSIS — Z515 Encounter for palliative care: Secondary | ICD-10-CM

## 2022-03-23 DIAGNOSIS — Z87440 Personal history of urinary (tract) infections: Secondary | ICD-10-CM

## 2022-03-23 DIAGNOSIS — I5032 Chronic diastolic (congestive) heart failure: Secondary | ICD-10-CM

## 2022-03-23 LAB — POCT URINALYSIS DIP (CLINITEK)
Bilirubin, UA: NEGATIVE
Blood, UA: NEGATIVE
Glucose, UA: NEGATIVE mg/dL
Ketones, POC UA: NEGATIVE mg/dL
Leukocytes, UA: NEGATIVE
Nitrite, UA: NEGATIVE
POC PROTEIN,UA: NEGATIVE
Spec Grav, UA: 1.015 (ref 1.010–1.025)
Urobilinogen, UA: 0.2 E.U./dL
pH, UA: 6 (ref 5.0–8.0)

## 2022-03-23 MED ORDER — BENZONATATE 200 MG PO CAPS: 200.0000 mg | ORAL_CAPSULE | Freq: Two times a day (BID) | ORAL | 1 refills | Status: DC | PRN

## 2022-03-23 NOTE — Assessment & Plan Note (Signed)
Sounds like her dry weight is right around 148 pounds so she will just take an extra Lasix in the afternoon if needed if she is up more than a couple pounds from the dry weight.  Just encouraged her to weigh daily.

## 2022-03-23 NOTE — Assessment & Plan Note (Signed)
Reviewed the MOST form together.  She did review what we marked and signed.  Made a copy to place into the chart.

## 2022-03-23 NOTE — Assessment & Plan Note (Signed)
Will send for culture.  She did asked to have a sample cup to take home as it is difficult for her to get here I think that is reasonable so we did give her a cup and that to take home.

## 2022-03-23 NOTE — Progress Notes (Signed)
Established Patient Office Visit  Subjective   Patient ID: Terri Small, female    DOB: [birth year]  Age: 88 y.o. MRN: [id number]  Chief Complaint  Patient presents with   Follow-up    3 month    Urinary Tract Infection    Patient complains of urinary discomfort for 5 days     HPI  Started palliative care services.  They are coming out monthly. Recommended to complete a MOST form. She brought a copy with her.   She is feeling better overall.  Wears her oxygen occ. Has noticed if she keeps her weight around 148 she feels better. She occ take and extra  fluid pill in the afternoon if her weight is up.    Had some urinary discomfort for about 5 days. Did home test on Friday and it was positive. No hematuria or back pain.  She still dealing with her chronic cough.  She is not sure that the Tessalon Perles helped or not or just getting some of the fluid off may have actually helped.  She would like a refill on the Tessalon though.    ROS    Objective:     BP 106/66   Pulse 64   Resp 18   Ht '5\' 3"'$  (1.6 m)   Wt 152 lb (68.9 kg)   SpO2 94%   BMI 26.93 kg/m    Physical Exam Vitals and nursing note reviewed.  Constitutional:      Appearance: She is well-developed.  HENT:     Head: Normocephalic and atraumatic.  Cardiovascular:     Rate and Rhythm: Normal rate and regular rhythm.     Heart sounds: Normal heart sounds.  Pulmonary:     Effort: Pulmonary effort is normal.     Breath sounds: Normal breath sounds.  Skin:    General: Skin is warm and dry.  Neurological:     Mental Status: She is alert and oriented to person, place, and time.  Psychiatric:        Behavior: Behavior normal.     Results for orders placed or performed in visit on 03/23/22  POCT URINALYSIS DIP (CLINITEK)  Result Value Ref Range   Color, UA yellow yellow   Clarity, UA clear clear   Glucose, UA negative negative mg/dL   Bilirubin, UA negative negative   Ketones, POC UA negative negative  mg/dL   Spec Grav, UA 1.015 1.010 - 1.025   Blood, UA negative negative   pH, UA 6.0 5.0 - 8.0   POC PROTEIN,UA negative negative, trace   Urobilinogen, UA 0.2 0.2 or 1.0 E.U./dL   Nitrite, UA Negative Negative   Leukocytes, UA Negative Negative      The ASCVD Risk score (Arnett DK, et al., 2019) failed to calculate for the following reasons:   The 2019 ASCVD risk score is only valid for ages 61 to 21    Assessment & Plan:   Problem List Items Addressed This Visit       Cardiovascular and Mediastinum   Congestive heart failure with left ventricular diastolic dysfunction (HCC)    Sounds like her dry weight is right around 148 pounds so she will just take an extra Lasix in the afternoon if needed if she is up more than a couple pounds from the dry weight.  Just encouraged her to weigh daily.         Other   Palliative care encounter    Reviewed the MOST form together.  She did review what we marked and signed.  Made a copy to place into the chart.       History of recurrent UTI (urinary tract infection)    Will send for culture.  She did asked to have a sample cup to take home as it is difficult for her to get here I think that is reasonable so we did give her a cup and that to take home.       Other Visit Diagnoses     Dysuria    -  Primary   Relevant Orders   POCT URINALYSIS DIP (CLINITEK) (Completed)   Urine Culture   Acute cough       Relevant Medications   benzonatate (TESSALON) 200 MG capsule      Dysuria-declined exam today.  Urinalysis looks clear but we will send for culture for further evaluation.  Return in about 3 months (around 06/23/2022) for f/u ok for E-visit.   I spent 32 minutes on the day of the encounter to include pre-visit record review, face-to-face time with the patient and post visit ordering of test.   Beatrice Lecher, MD

## 2022-03-25 NOTE — Progress Notes (Signed)
HI Terri Small, your urine culture is positive.  The sensitivities should come in hopefully later today or first thing in the morning.

## 2022-03-26 ENCOUNTER — Other Ambulatory Visit: Payer: Self-pay | Admitting: *Deleted

## 2022-03-26 ENCOUNTER — Encounter: Payer: Self-pay | Admitting: *Deleted

## 2022-03-26 LAB — URINE CULTURE
MICRO NUMBER:: 13490533
SPECIMEN QUALITY:: ADEQUATE

## 2022-03-26 MED ORDER — NITROFURANTOIN MONOHYD MACRO 100 MG PO CAPS: 100.0000 mg | ORAL_CAPSULE | Freq: Two times a day (BID) | ORAL | 0 refills | Status: DC

## 2022-03-26 NOTE — Progress Notes (Signed)
Hi Laela your urine culture is positive. I will send over Veblen today.

## 2022-03-26 NOTE — Addendum Note (Signed)
Addended by: Beatrice Lecher D on: 03/26/2022 01:04 PM   Modules accepted: Orders

## 2022-03-29 MED ORDER — CEFDINIR 300 MG PO CAPS: 300.0000 mg | ORAL_CAPSULE | Freq: Two times a day (BID) | ORAL | 0 refills | Status: DC

## 2022-03-29 NOTE — Progress Notes (Signed)
OK, will send over cefdinir based on resistance patterns.  New prescription sent to pharmacy.

## 2022-03-29 NOTE — Addendum Note (Signed)
Addended by: Beatrice Lecher D on: 03/29/2022 11:02 AM   Modules accepted: Orders

## 2022-04-06 ENCOUNTER — Other Ambulatory Visit: Payer: Medicare Other | Admitting: Family Medicine

## 2022-04-06 ENCOUNTER — Encounter: Payer: Self-pay | Admitting: Family Medicine

## 2022-04-06 VITALS — BP 110/64 | HR 84 | Resp 22

## 2022-04-06 DIAGNOSIS — I272 Pulmonary hypertension, unspecified: Secondary | ICD-10-CM

## 2022-04-06 DIAGNOSIS — I5032 Chronic diastolic (congestive) heart failure: Secondary | ICD-10-CM

## 2022-04-06 DIAGNOSIS — J8410 Pulmonary fibrosis, unspecified: Secondary | ICD-10-CM

## 2022-04-06 DIAGNOSIS — Z515 Encounter for palliative care: Secondary | ICD-10-CM

## 2022-04-06 NOTE — Progress Notes (Signed)
[facility] Consult Note Telephone: [phone]  Fax: [phone]    Date of encounter: 04/06/22 9:28 AM PATIENT NAME: Terri Small [address] [place] [address]   [phone] (home)  DOB: 05-16-1931 MRN: [id number] PRIMARY CARE PROVIDER:    Hali Marry, MD,  [place] [address] [phone]  REFERRING PROVIDER:   Hali Marry, MD [place],  [place] [address] [phone]  RESPONSIBLE PARTY:    Contact Information     Name Relation Home Work Abanda Daughter   [phone]        I met face to face with patient in her home. Palliative Care was asked to follow this patient by consultation request of  Hali Marry, * to address advance care planning and complex medical decision making. This is a follow up visit  ASSESSMENT, SYMPTOM MANAGEMENT AND PLAN / RECOMMENDATIONS:   Chronic diastolic heart failure/Pulmonary Hypertension Stable with some occasional exertional CP. NYHA class 3. Continues Cardizem CD 180 mg daily, Metoprolol 25 mg in evening, Klor Con 20 mEq with Lasix 40 mg daily with 2nd afternoon dose if weight up by 2-3 lbs/24 hours or 5 lbs in 1 week. Continue daily weights. If CP worsens or doesn't improve with rest, notify PCP or Palliative Care   Pulmonary fibrosis Stable. Continues Albuterol inhaler q 4 hrs prn, Breo-Ellipta daily Continue O2 @ 2L , advised to use during the day if O2 sats < 90%.   Palliative Care Encounter Reviewed MOST form created with and signed by PCP at last visit.   Advance Care Planning/Goals of Care: Goals include to maximize quality of life and symptom management.  Identification of a healthcare agent-daughter Terri Small Review of an advance directive document-MOST. Decision not to resuscitate or to de-escalate disease focused treatments due to poor prognosis. CODE  STATUS: MOST as of 03/23/22: DNR/DNI with comfort measures only Use of antibiotics and IV fluids on a case by case, time limited basis No feeding tube.     Follow up Palliative Care Visit: Palliative care will continue to follow for complex medical decision making, advance care planning, and clarification of goals. Offered return visit in 6 weeks, requested return 12 weeks or prn. Pt requesting possible video visit but wants to discuss with daughter.   This visit was coded based on medical decision making (MDM).  PPS: 70%  HOSPICE ELIGIBILITY/DIAGNOSIS: TBD  Chief Complaint:  Palliative Care is following for chronic medical management of cardiomyopathy with aortic stenosis.  HISTORY OF PRESENT ILLNESS:  Wells Gerdeman is a 88 y.o. year old female with atrial fibrillation on chronic anticoagulation, LVDD, secondary pulmonary hypertension, Tricuspid regurgitation, Mitral incompetence, and moderate aortic stenosis, cardiomyopathy and pulmonary fibrosis O2 dependent. Has had sensation of chest "heaviness" with exertion at times "like when making the bed" which goes away when she stops. No worsening SOB.  No PND or orthopnea.  No falls, appetite good. No constipation. No medication changes. Pt remains independent in all her ADLs. Had office visit for UTI with E coli with PCP on 03/23/22 with resistance to PCN, fluoroquinolones and Bactrim with intermediate resistance to Augmentin.  Treated with Cefdinir per PCP and recommended if her weight was up by 2-3 lbs over her dry weight of 148 lbs to take an extra Lasix later in the day.  History obtained from review of EMR, discussion with Ms. Simmerman.  I reviewed  available labs, medications, imaging, studies and related documents from the EMR.  Records reviewed and summarized above.   ROS General: NAD EYES: denies vision changes, some itching of left eyelid Cardiovascular: intermittent chest heaviness, endorses DOE Pulmonary: denies cough, denies increased  SOB Abdomen: endorses good appetite, denies constipation, endorses continence of bowel GU: denies dysuria, endorses continence of urine MSK:  no falls reported Neurological: denies pain, denies insomnia Psych: Endorses positive mood   Physical Exam: Weight 155 lbs in 12/15/2021, currently 152 lbs 03/23/22 Constitutional: NAD General: WNWD EYES: anicteric sclera, lids intact, no discharge, sclerae white ENMT: intact hearing, oral mucous membranes moist, dentition intact CV: S1S2, IRIR, no LE edema Pulmonary: CTAB, no increased work of breathing, no cough, room air MSK: no sarcopenia, moves all extremities, ambulatory Skin: warm and dry Neuro:  no generalized weakness,  no cognitive impairment Psych: non-anxious affect, A and O x 3    Thank you for the opportunity to participate in the care of Ms. Fleury.  The palliative care team will continue to follow. Please call our office at [phone] if we can be of additional assistance.   Marijo Conception, FNP -C  COVID-19 PATIENT SCREENING TOOL Asked and negative response unless otherwise noted:   Have you had symptoms of covid, tested positive or been in contact with someone with symptoms/positive test in the past 5-10 days?  No

## 2022-05-08 ENCOUNTER — Emergency Department
Admission: RE | Admit: 2022-05-08 | Discharge: 2022-05-08 | Disposition: A | Discharge status: Home / Self Care | Payer: Medicare Other | Source: Ambulatory Visit | Attending: Family Medicine | Admitting: Family Medicine

## 2022-05-08 VITALS — BP 123/59 | HR 71 | Temp 97.8°F | Resp 18 | Ht 63.0 in

## 2022-05-08 DIAGNOSIS — B029 Zoster without complications: Secondary | ICD-10-CM

## 2022-05-08 MED ORDER — GABAPENTIN 100 MG PO CAPS: ORAL_CAPSULE | ORAL | 0 refills | Status: DC

## 2022-05-08 MED ORDER — CAPSAICIN 0.025 % EX CREA: TOPICAL_CREAM | Freq: Two times a day (BID) | CUTANEOUS | 0 refills | Status: DC

## 2022-05-08 MED ORDER — VALACYCLOVIR HCL 1 G PO TABS: 1000.0000 mg | ORAL_TABLET | Freq: Three times a day (TID) | ORAL | 0 refills | Status: DC

## 2022-05-08 NOTE — Discharge Instructions (Signed)
Take the antiviral valacyclovir 3 times a day Take gabapentin at bedtime.  You may take this as often as 3 times a day if needed for pain.  It may cause drowsiness May take Tylenol for pain if needed I have prescribed a topical cream that may also help you See your primary care doctor in follow-up

## 2022-05-08 NOTE — ED Triage Notes (Signed)
Patient c/o right sided flank pain with blisters x 1 day.  Patient has taken Tylenol for the pain.

## 2022-05-08 NOTE — ED Provider Notes (Signed)
[name]    CSN: [id number] Arrival date & time: 05/08/22  1056      History   Chief Complaint Chief Complaint  Patient presents with   Rash    Possible shingles - Entered by patient   Appointment    HPI Terri Small is a 88 y.o. female.   HPI  [age] year old woman who is here with a granddaughter for evaluation of a rash.  It extends from her lower ribs on the right side around to the xiphoid area.  Clusters of blisters.  She thinks it is shingles.  Unfortunately has had shingles 2 times before.  Has also had a Zostavax vaccination and Shingrix shots. Does not think she has had any recent illness or stress to decrease immunity She does have multiple medical problems under control with frequent visits to PCP and specialty.  Past Medical History:  Diagnosis Date   Arthritis    "everywhere"   Atrial fibrillation ([place])    s/p 2x ablation on chronic coumadin, previously on amiodarone but had pulmonary SE.   CHF (congestive heart failure) ([place])    COPD (chronic obstructive pulmonary disease) ([place])    "pulmonologist told me last week my lungs are good" (07/10/2015)   Diverticulitis    Dysrhythmia    Fever blister 11/06/2014   Fracture of lateral malleolus 11/17/2013   Fracture, fibula 11/26/2013   Fracture, sternum closed 11/26/13   GERD (gastroesophageal reflux disease)    Headache    Herpes zoster    HERPES ZOSTER 02/28/2009   Qualifier: Diagnosis of  By: Assunta Found MD, Annie Main     Osteoporosis    Shortness of breath dyspnea    Sternal fracture with retrosternal contusion 11/17/2013    Patient Active Problem List   Diagnosis Date Noted   Palliative care encounter 03/09/2022   History of vertebral compression fracture - T3 01/27/2022   Aortic stenosis, moderate 12/23/2021   Oxygen dependent 12/16/2021   Combined forms of age-related cataract of left eye 05/12/2017   Arthritis of right hip 07/23/2016   Primary osteoarthritis of right hip 07/22/2016    Class:  Chronic   Squamous cell skin cancer 03/22/2016   Foot pain, bilateral 12/10/2015   Glenohumeral arthritis 07/10/2015   Chronic diastolic heart failure ([place]) 12/25/2013   Cardiomyopathy ([place]) 12/21/2013   Pulmonary fibrosis ([place]) 10/16/2013   TR (tricuspid regurgitation) 10/16/2013   Congestive heart failure with left ventricular diastolic dysfunction ([place]) 08/27/2013   Pulmonary hypertension ([place]) 08/27/2013   CAFL (chronic airflow limitation) ([place]) 08/16/2013   MI (mitral incompetence) 06/30/2011   History of recurrent UTI (urinary tract infection) 04/21/2010   Atrial fibrillation ([place]) 05/13/2009   OSTEOPOROSIS 05/13/2009    Past Surgical History:  Procedure Laterality Date   ATRIAL FIBRILLATION ABLATION  ~ 2002; ~ 2004   CARDIAC CATHETERIZATION  ~ 2002; ~ 2004   COLONOSCOPY     JOINT REPLACEMENT     LAPAROSCOPIC CHOLECYSTECTOMY  ~ 2009   TOTAL HIP ARTHROPLASTY Right 07/23/2016   Procedure: RIGHT TOTAL HIP ARTHROPLASTY ANTERIOR APPROACH;  Surgeon: Frederik Pear, MD;  Location: [facility];  Service: Orthopedics;  Laterality: Right;   TOTAL SHOULDER ARTHROPLASTY Left 07/10/2015   TOTAL SHOULDER ARTHROPLASTY Left 07/10/2015   Procedure: LEFT TOTAL SHOULDER ARTHROPLASTY;  Surgeon: Tania Ade, MD;  Location: [facility];  Service: Orthopedics;  Laterality: Left;  Left shoulder arthroplasty   VAGINAL HYSTERECTOMY      OB History   No obstetric history on file.  Home Medications    Prior to Admission medications   Medication Sig Start Date End Date Taking? Authorizing Provider  AMBULATORY NON FORMULARY MEDICATION Medication Name: Home oxygen set on 2 Liters.  Also needs portable oxygen.  DX of severe COPD and recent respiratory illness. 12/23/21  Yes Hali Marry, MD  benzonatate (TESSALON) 200 MG capsule Take 1 capsule (200 mg total) by mouth 2 (two) times daily as needed for cough. 03/23/22  Yes Hali Marry, MD  capsaicin (ZOSTRIX) 0.025 % cream Apply topically 2  (two) times daily. 05/08/22  Yes Raylene Everts, MD  Cranberry 1000 MG CAPS Take by mouth in the morning and at bedtime. With D ( Uricalm)   Yes [provider]  diltiazem (CARDIZEM CD) 180 MG 24 hr capsule Take 180 mg by mouth daily. 03/16/17  Yes [provider]  fluticasone furoate-vilanterol (BREO ELLIPTA) 100-25 MCG/ACT AEPB Inhale 1 puff into the lungs daily. 12/15/21  Yes Hali Marry, MD  furosemide (LASIX) 40 MG tablet Take 1 tablet (40 mg total) by mouth daily. 03/08/22  Yes Hali Marry, MD  gabapentin (NEURONTIN) 100 MG capsule Take at bedtime.  May take as often as 3 times a day if needed for pain 05/08/22  Yes Raylene Everts, MD  metoprolol succinate (TOPROL-XL) 25 MG 24 hr tablet Take 25 mg by mouth every evening. 06/28/16  Yes [provider]  Multiple Vitamins-Minerals (PRESERVISION AREDS 2+MULTI VIT) CAPS Take 1 capsule by mouth daily. 1 capsule in the morning   Yes [provider]  Potassium Chloride (KLOR-CON PO) Take 20 mEq by mouth daily.   Yes [provider]  Sod Fluoride-Potassium Nitrate 1.1-5 % PSTE Place 1 application. onto teeth at bedtime. 01/27/22  Yes Hali Marry, MD  valACYclovir (VALTREX) 1000 MG tablet Take 1 tablet (1,000 mg total) by mouth 3 (three) times daily. 05/08/22  Yes Raylene Everts, MD  albuterol (VENTOLIN HFA) 108 (90 Base) MCG/ACT inhaler Inhale 2 puffs into the lungs every 4 (four) hours as needed. 12/12/21 01/11/22  [provider]  apixaban (ELIQUIS) 5 MG TABS tablet Take 5 mg by mouth 2 (two) times daily. 08/20/20 03/23/22  [provider]    Family History Family History  Problem Relation Age of Onset   Stroke Father    Emphysema Father    Atrial fibrillation Unknown     Social History Social History   Tobacco Use   Smoking status: Former    Packs/day: 1.00    Years: 6.00    Total pack years: 6.00    Types: Cigarettes    Quit date: 10/18/1960     Years since quitting: 61.5   Smokeless tobacco: Never  Substance Use Topics   Alcohol use: Yes    Alcohol/week: 4.0 standard drinks of alcohol    Types: 2 Glasses of wine, 2 Standard drinks or equivalent per week   Drug use: No     Allergies   Nexium [esomeprazole magnesium], Sulfonamide derivatives, and Nitrofurantoin   Review of Systems Review of Systems See HPI  Physical Exam Triage Vital Signs ED Triage Vitals  Enc Vitals Group     BP 05/08/22 1108 (!) 123/59     Pulse Rate 05/08/22 1108 71     Resp 05/08/22 1108 18     Temp 05/08/22 1108 97.8 F (36.6 C)     Temp Source 05/08/22 1108 Oral     SpO2 05/08/22 1108 92 %  Weight --      Height 05/08/22 1110 '5\' 3"'$  (1.6 m)     Head Circumference --      Peak Flow --      Pain Score 05/08/22 1109 8     Pain Loc --      Pain Edu? --      Excl. in [place]? --    No data found.  Updated Vital Signs BP (!) 123/59 (BP Location: Left Arm)   Pulse 71   Temp 97.8 F (36.6 C) (Oral)   Resp 18   Ht '5\' 3"'$  (1.6 m)   SpO2 92%   BMI 26.93 kg/m      Physical Exam Constitutional:      General: She is not in acute distress.    Appearance: Normal appearance. She is well-developed.  HENT:     Head: Normocephalic and atraumatic.  Eyes:     Conjunctiva/sclera: Conjunctivae normal.     Pupils: Pupils are equal, round, and reactive to light.  Cardiovascular:     Rate and Rhythm: Normal rate.  Pulmonary:     Effort: Pulmonary effort is normal. No respiratory distress.    Chest:    Abdominal:     General: There is no distension.     Palpations: Abdomen is soft.  Musculoskeletal:        General: Normal range of motion.     Cervical back: Normal range of motion.  Skin:    General: Skin is warm and dry.     Comments: Clusters of vesicles on erythematous base in distribution indicated  Neurological:     General: No focal deficit present.     Mental Status: She is alert.  Psychiatric:        Mood and Affect: Mood  normal.        Behavior: Behavior normal.      UC Treatments / Results  Labs (all labs ordered are listed, but only abnormal results are displayed) Labs Reviewed - No data to display  EKG   Radiology No results found.  Procedures Procedures (including critical care time)  Medications Ordered in UC Medications - No data to display  Initial Impression / Assessment and Plan / UC Course  I have reviewed the triage vital signs and the nursing notes.  Pertinent labs & imaging results that were available during my care of the patient were reviewed by me and considered in my medical decision making (see chart for details).     Discussed shingles.  Recurring chickenpox.  Contagiousness.  Treatment. Final Clinical Impressions(s) / UC Diagnoses   Final diagnoses:  Herpes zoster without complication     Discharge Instructions      Take the antiviral valacyclovir 3 times a day Take gabapentin at bedtime.  You may take this as often as 3 times a day if needed for pain.  It may cause drowsiness May take Tylenol for pain if needed I have prescribed a topical cream that may also help you See your primary care doctor in follow-up   ED Prescriptions     Medication Sig Dispense Auth. Provider   valACYclovir (VALTREX) 1000 MG tablet Take 1 tablet (1,000 mg total) by mouth 3 (three) times daily. 21 tablet Raylene Everts, MD   gabapentin (NEURONTIN) 100 MG capsule Take at bedtime.  May take as often as 3 times a day if needed for pain 30 capsule Raylene Everts, MD   capsaicin (ZOSTRIX) 0.025 % cream Apply topically 2 (two)  times daily. 60 g Raylene Everts, MD      PDMP not reviewed this encounter.   Raylene Everts, MD 05/08/22 1153

## 2022-05-09 ENCOUNTER — Telehealth: Payer: Self-pay | Admitting: Emergency Medicine

## 2022-05-09 NOTE — Telephone Encounter (Signed)
Call by RN to see how pt was doing today & to see if she had any questions or concerns about her visit yesterday. Message left with a call back #

## 2022-05-10 ENCOUNTER — Telehealth: Payer: Self-pay | Admitting: Family Medicine

## 2022-05-10 NOTE — Telephone Encounter (Signed)
Pt's daughter called in over the weekend to say she thought pt had shingles and was advised to take her to PCP or urgent care.  Returned call to daughter and she states that they did take the patient to Urgent Care, was diagnosed with shingles and started on antiviral therapy and Gabapentin.  Currently she is not taking the Gabapentin, taking Tylenol and has gotten a topical lidocaine which is helping.  She states she is managing well. Damaris Hippo FNP-C

## 2022-05-25 ENCOUNTER — Ambulatory Visit (INDEPENDENT_AMBULATORY_CARE_PROVIDER_SITE_OTHER): Payer: Medicare Other | Admitting: Family Medicine

## 2022-05-25 DIAGNOSIS — Z0000 Encounter for general adult medical examination without abnormal findings: Secondary | ICD-10-CM | POA: Diagnosis not present

## 2022-05-25 NOTE — Progress Notes (Signed)
MEDICARE ANNUAL WELLNESS VISIT  05/25/2022  Telephone Visit Disclaimer This Medicare AWV was conducted by telephone due to national recommendations for restrictions regarding the COVID-19 Pandemic (e.g. social distancing).  I verified, using two identifiers, that I am speaking with Terri Small or their authorized healthcare agent. I discussed the limitations, risks, security, and privacy concerns of performing an evaluation and management service by telephone and the potential availability of an in-person appointment in the future. The patient expressed understanding and agreed to proceed.  Location of Patient: Home Location of Provider (nurse):  In the office.  Subjective:    Terri Small is a 88 y.o. female patient of Metheney, Rene Kocher, MD who had a Medicare Annual Wellness Visit today via telephone. 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 she has 4 children. she reports that she is socially active and does interact with friends/family regularly. she is minimally physically active and enjoys Crossword puzzles, crocheting and reading.  Patient Care Team: Hali Marry, MD as PCP - General (Family Medicine) Elsie Stain, MD as Consulting Physician (Pulmonary Disease) Ellis Parents, MD as Attending Physician (Cardiology) Bethann Goo, MD as Physician Assistant (Urology)     05/25/2022    1:08 PM 12/17/2021    9:27 AM 07/12/2016    3:03 PM 08/22/2015    8:05 AM 07/10/2015    7:13 PM 07/04/2015    9:21 AM 10/29/2014   10:35 AM  Advanced Directives  Does Patient Have a Medical Advance Directive? Yes No Yes Yes Yes No;Yes Yes  Type of Advance Directive Living will    [place];Living will Healthcare Power of [place];Living will  Does patient want to make changes to medical advance directive? No - Patient declined    No - Patient declined No - Patient declined No - Patient declined  Copy of  [place] in Chart?   No - copy requested No - copy requested No - copy requested No - copy requested No - copy requested  Would patient like information on creating a medical advance directive?  No - Patient declined    No - patient declined information     Hospital Utilization Over the Past 12 Months: # of hospitalizations or ER visits: 1 # of surgeries: 0  Review of Systems    Patient reports that her overall health is worse compared to last year.  History obtained from chart review and the patient  Patient Reported Readings (BP, Pulse, CBG, Weight, etc) none  Pain Assessment Pain : No/denies pain Pain Score: 0-No pain     Current Medications & Allergies (verified) Allergies as of 05/25/2022       Reactions   Nexium [esomeprazole Magnesium] Other (See Comments)   Chest heaviness.   Sulfonamide Derivatives Other (See Comments)   REACTION: rash- burning on face   Nitrofurantoin Other (See Comments)   Pulmonary fibrosis , flushing,hot flashes        Medication List        Accurate as of May 25, 2022  1:23 PM. If you have any questions, ask your nurse or doctor.          albuterol 108 (90 Base) MCG/ACT inhaler Commonly known as: VENTOLIN HFA Inhale 2 puffs into the lungs every 4 (four) hours as needed.   AMBULATORY NON FORMULARY MEDICATION Medication Name: Home oxygen set on 2 Liters.  Also needs portable oxygen.  DX of severe  COPD and recent respiratory illness.   apixaban 5 MG Tabs tablet Commonly known as: ELIQUIS Take 5 mg by mouth 2 (two) times daily.   benzonatate 200 MG capsule Commonly known as: TESSALON Take 1 capsule (200 mg total) by mouth 2 (two) times daily as needed for cough.   capsaicin 0.025 % cream Commonly known as: ZOSTRIX Apply topically 2 (two) times daily.   Cranberry 1000 MG Caps Take by mouth in the morning and at bedtime. With D ( Uricalm)   diltiazem 180 MG 24 hr capsule Commonly known as: CARDIZEM  CD Take 180 mg by mouth daily.   fluticasone furoate-vilanterol 100-25 MCG/ACT Aepb Commonly known as: BREO ELLIPTA Inhale 1 puff into the lungs daily.   furosemide 40 MG tablet Commonly known as: LASIX Take 1 tablet (40 mg total) by mouth daily.   gabapentin 100 MG capsule Commonly known as: Neurontin Take at bedtime.  May take as often as 3 times a day if needed for pain   Klor-Con M20 20 MEQ tablet Generic drug: potassium chloride SA Take 1 tablet by mouth daily.   KLOR-CON PO Take 20 mEq by mouth daily.   metoprolol succinate 25 MG 24 hr tablet Commonly known as: TOPROL-XL Take 25 mg by mouth every evening.   PreserVision AREDS 2+Multi Vit Caps Take 1 capsule by mouth daily. 1 capsule in the morning   Sod Fluoride-Potassium Nitrate 1.1-5 % Pste Place 1 application. onto teeth at bedtime.   valACYclovir 1000 MG tablet Commonly known as: VALTREX Take 1 tablet (1,000 mg total) by mouth 3 (three) times daily.        History (reviewed): Past Medical History:  Diagnosis Date   Arthritis    "everywhere"   Atrial fibrillation ([place])    s/p 2x ablation on chronic coumadin, previously on amiodarone but had pulmonary SE.   CHF (congestive heart failure) ([place])    COPD (chronic obstructive pulmonary disease) ([place])    "pulmonologist told me last week my lungs are good" (07/10/2015)   Diverticulitis    Dysrhythmia    Fever blister 11/06/2014   Fracture of lateral malleolus 11/17/2013   Fracture, fibula 11/26/2013   Fracture, sternum closed 11/26/13   GERD (gastroesophageal reflux disease)    Headache    Herpes zoster    HERPES ZOSTER 02/28/2009   Qualifier: Diagnosis of  By: Assunta Found MD, Annie Main     Osteoporosis    Shortness of breath dyspnea    Sternal fracture with retrosternal contusion 11/17/2013   Past Surgical History:  Procedure Laterality Date   ATRIAL FIBRILLATION ABLATION  ~ 2002; ~ 2004   CARDIAC CATHETERIZATION  ~ 2002; ~ 2004   COLONOSCOPY     JOINT  REPLACEMENT     LAPAROSCOPIC CHOLECYSTECTOMY  ~ 2009   TOTAL HIP ARTHROPLASTY Right 07/23/2016   Procedure: RIGHT TOTAL HIP ARTHROPLASTY ANTERIOR APPROACH;  Surgeon: Frederik Pear, MD;  Location: [facility];  Service: Orthopedics;  Laterality: Right;   TOTAL SHOULDER ARTHROPLASTY Left 07/10/2015   TOTAL SHOULDER ARTHROPLASTY Left 07/10/2015   Procedure: LEFT TOTAL SHOULDER ARTHROPLASTY;  Surgeon: Tania Ade, MD;  Location: [facility];  Service: Orthopedics;  Laterality: Left;  Left shoulder arthroplasty   VAGINAL HYSTERECTOMY     Family History  Problem Relation Age of Onset   Stroke Father    Emphysema Father    Atrial fibrillation Unknown    Social History   Socioeconomic History   Marital status: Widowed    Spouse name: [name]  Number of children: 4   Years of education: 14   Highest education level: Some college, no degree  Occupational History   Occupation: Retired Producer, television/film/video: RETIRED  Tobacco Use   Smoking status: Former    Packs/day: 1.00    Years: 6.00    Total pack years: 6.00    Types: Cigarettes    Quit date: 10/18/1960    Years since quitting: 61.6   Smokeless tobacco: Never  Substance and Sexual Activity   Alcohol use: Not Currently    Comment: One glass of wine every two weeks.   Drug use: No   Sexual activity: Not Currently    Birth control/protection: Surgical  Other Topics Concern   Not on file  Social History Narrative   Husband [name] passed away in 2014-01-03, had alzheimers.   Has 4 adult children. Lives with her daughter and son-in-law.  Enjoys Crossword puzzles, crocheting and reading.   Social Determinants of Health   Financial Resource Strain: Low Risk  (05/25/2022)   Overall Financial Resource Strain (CARDIA)    Difficulty of Paying Living Expenses: Not hard at all  Food Insecurity: No Food Insecurity (05/25/2022)   Hunger Vital Sign    Worried About Running Out of Food in the Last Year: Never true    Ran Out of Food in the Last Year: Never true   Transportation Needs: No Transportation Needs (05/25/2022)   PRAPARE - Hydrologist (Medical): No    Lack of Transportation (Non-Medical): No  Physical Activity: Inactive (05/25/2022)   Exercise Vital Sign    Days of Exercise per Week: 0 days    Minutes of Exercise per Session: 0 min  Stress: No Stress Concern Present (05/25/2022)   [place]    Feeling of Stress : Not at all  Social Connections: Moderately Integrated (05/25/2022)   Social Connection and Isolation Panel [NHANES]    Frequency of Communication with Friends and Family: More than three times a week    Frequency of Social Gatherings with Friends and Family: More than three times a week    Attends Religious Services: More than 4 times per year    Active Member of [employer] or Organizations: Yes    Attends Archivist Meetings: More than 4 times per year    Marital Status: Widowed    Activities of Daily Living    05/25/2022    1:12 PM  In your present state of health, do you have any difficulty performing the following activities:  Hearing? 0  Vision? 0  Difficulty concentrating or making decisions? 0  Walking or climbing stairs? 1  Comment does not climb stairs  Dressing or bathing? 0  Doing errands, shopping? 1  Comment her daughter does them for her.  Preparing Food and eating ? N  Using the Toilet? N  In the past six months, have you accidently leaked urine? Y  Comment stress incontinence  Do you have problems with loss of bowel control? N  Managing your Medications? N  Managing your Finances? N  Housekeeping or managing your Housekeeping? N    Patient Education/ Literacy How often do you need to have someone help you when you read instructions, pamphlets, or other written materials from your doctor or pharmacy?: 1 - Never What is the last grade level you completed in school?: Two years of  college  Exercise Current Exercise Habits: The patient does not participate  in regular exercise at present, Exercise limited by: None identified  Diet Patient reports consuming 3 meals a day and 0 snack(s) a day Patient reports that her primary diet is: Regular Patient reports that she does have regular access to food.   Depression Screen    05/25/2022    1:10 PM 03/23/2022    9:44 AM 12/15/2021    3:47 PM 06/08/2017   10:25 AM 03/12/2016    2:45 PM 06/17/2014    2:35 PM 05/31/2013    2:45 PM  PHQ 2/9 Scores  PHQ - 2 Score 0 0 0 0 0 0 0     Fall Risk    05/25/2022    1:09 PM 03/23/2022    9:44 AM 12/15/2021    3:47 PM 05/09/2017    1:32 PM 03/12/2016    2:45 PM  Fall Risk   Falls in the past year? 0 0 0 Yes No  Comment    Emmi Telephone Survey: data to providers prior to load   Number falls in past yr: 0 0 0 1   Comment    Emmi Telephone Survey Actual Response = 1   Injury with Fall? 0 0 0 Yes   Risk for fall due to : No Fall Risks Impaired balance/gait;Impaired mobility Impaired mobility;Impaired balance/gait    Follow up Falls evaluation completed Falls prevention discussed;Falls evaluation completed Falls prevention discussed;Falls evaluation completed       Objective:  Peola Joynt seemed alert and oriented and she participated appropriately during our telephone visit.  Blood Pressure Weight BMI  BP Readings from Last 3 Encounters:  05/08/22 (!) 123/59  04/06/22 110/64  03/23/22 106/66   Wt Readings from Last 3 Encounters:  03/23/22 152 lb (68.9 kg)  12/21/21 154 lb (69.9 kg)  12/15/21 155 lb (70.3 kg)   BMI Readings from Last 1 Encounters:  05/08/22 26.93 kg/m    *Unable to obtain current vital signs, weight, and BMI due to telephone visit type  Hearing/Vision  Priseis did not seem to have difficulty with hearing/understanding during the telephone conversation Reports that she has not had a formal eye exam by an eye care professional within the past year Reports that  she has had a formal hearing evaluation within the past year *Unable to fully assess hearing and vision during telephone visit type  Cognitive Function:    05/25/2022    1:15 PM  6CIT Screen  What Year? 0 points  What month? 0 points  What time? 0 points  Count back from 20 0 points  Months in reverse 0 points  Repeat phrase 0 points  Total Score 0 points   (Normal:0-7, Significant for Dysfunction: >8)  Normal Cognitive Function Screening: Yes   Immunization & Health Maintenance Record Immunization History  Administered Date(s) Administered   Influenza Split 08/21/2012   Influenza Whole 08/13/2009, 07/24/2010   Influenza, High Dose Seasonal PF 07/29/2017, 07/17/2018, 07/18/2021   Influenza, Seasonal, Injecte, Preservative Fre 08/21/2012, 07/03/2013, 08/14/2014, 06/27/2015, 06/08/2016, 08/18/2020   Influenza,inj,Quad PF,6+ Mos 07/03/2013, 08/14/2014, 06/27/2015, 06/08/2016   PFIZER(Purple Top)SARS-COV-2 Vaccination 10/30/2019, 11/20/2019, 07/23/2020   Pneumococcal Conjugate-13 10/29/2014   Pneumococcal Polysaccharide-23 10/18/2002   Tdap 02/24/2009, 09/17/2020   Zoster Recombinat (Shingrix) 07/17/2018, 12/21/2018   Zoster, Live 10/18/2012    Health Maintenance  Topic Date Due   COVID-19 Vaccine (4 - Booster for [place] series) 06/10/2022 (Originally 09/17/2020)   COLONOSCOPY (Pts 45-15yr Insurance coverage will need to be confirmed)  12/15/2022 (Originally 06/05/2017)   INFLUENZA VACCINE  01/16/2023 (Originally 05/18/2022)   DEXA SCAN  05/26/2023 (Originally 06/06/2015)   TETANUS/TDAP  09/17/2030   Pneumonia Vaccine 82+ Years old  Completed   Zoster Vaccines- Shingrix  Completed   HPV VACCINES  Aged Out       Assessment  This is a routine wellness examination for [facility].  Health Maintenance: Due or Overdue There are no preventive care reminders to display for this patient.   Terri Small does not need a referral for Community Assistance: Care  Management:   no Social Work:    no Prescription Assistance:  no Nutrition/Diabetes Education:  no   Plan:  Personalized Goals  Goals Addressed               This Visit's Progress     Patient Stated (pt-stated)        Continue to stay healthy and active.       Personalized Health Maintenance & Screening Recommendations  Influenza vaccine Bone densitometry screening - Patient declined at this time.  Lung Cancer Screening Recommended: no (Low Dose CT Chest recommended if Age 24-80 years, 30 pack-year currently smoking OR have quit w/in past 15 years) Hepatitis C Screening recommended: no HIV Screening recommended: no  Advanced Directives: Written information was not prepared per patient's request.  Referrals & Orders No orders of the defined types were placed in this encounter.   Follow-up Plan Follow-up with Hali Marry, MD as planned Medicare wellness visit in one year.  AVS printed and mailed to the patient.   I have personally reviewed and noted the following in the patient's chart:   Medical and social history Use of alcohol, tobacco or illicit drugs  Current medications and supplements Functional ability and status Nutritional status Physical activity Advanced directives List of other physicians Hospitalizations, surgeries, and ER visits in previous 12 months Vitals Screenings to include cognitive, depression, and falls Referrals and appointments  In addition, I have reviewed and discussed with Terri Small certain preventive protocols, quality metrics, and best practice recommendations. A written personalized care plan for preventive services as well as general preventive health recommendations is available and can be mailed to the patient at her request.      Tinnie Gens, RN BSN  05/25/2022

## 2022-05-25 NOTE — Patient Instructions (Signed)
[facility] Maintenance Summary and Written Plan of Care  Terri Small ,  Thank you for allowing me to perform your Medicare Annual Wellness Visit and for your ongoing commitment to your health.   Health Maintenance & Immunization History Health Maintenance  Topic Date Due   COVID-19 Vaccine (4 - Booster for Pfizer series) 06/10/2022 (Originally 09/17/2020)   COLONOSCOPY (Pts 45-72yr Insurance coverage will need to be confirmed)  12/15/2022 (Originally 06/05/2017)   INFLUENZA VACCINE  01/16/2023 (Originally 05/18/2022)   DEXA SCAN  05/26/2023 (Originally 06/06/2015)   TETANUS/TDAP  09/17/2030   Pneumonia Vaccine 88 Years old  Completed   Zoster Vaccines- Shingrix  Completed   HPV VACCINES  Aged Out   Immunization History  Administered Date(s) Administered   Influenza Split 08/21/2012   Influenza Whole 08/13/2009, 07/24/2010   Influenza, High Dose Seasonal PF 07/29/2017, 07/17/2018, 07/18/2021   Influenza, Seasonal, Injecte, Preservative Fre 08/21/2012, 07/03/2013, 08/14/2014, 06/27/2015, 06/08/2016, 08/18/2020   Influenza,inj,Quad PF,6+ Mos 07/03/2013, 08/14/2014, 06/27/2015, 06/08/2016   PFIZER(Purple Top)SARS-COV-2 Vaccination 10/30/2019, 11/20/2019, 07/23/2020   Pneumococcal Conjugate-13 10/29/2014   Pneumococcal Polysaccharide-23 10/18/2002   Tdap 02/24/2009, 09/17/2020   Zoster Recombinat (Shingrix) 07/17/2018, 12/21/2018   Zoster, Live 10/18/2012    These are the patient goals that we discussed:  Goals Addressed               This Visit's Progress     Patient Stated (pt-stated)        Continue to stay healthy and active.         This is a list of Health Maintenance Items that are overdue or due now: Influenza vaccine Bone densitometry screening - Patient declined at this time.  Orders/Referrals Placed Today: No orders of the defined types were placed in this encounter.  (Contact our referral department at [phone] you have not  spoken with someone about your referral appointment within the next 5 days)    Follow-up Plan Follow-up with MHali Marry MD as planned Medicare wellness visit in one year.  AVS printed and mailed to the patient.      Health Maintenance, Female Adopting a healthy lifestyle and getting preventive care are important in promoting health and wellness. Ask your health care provider about: The right schedule for you to have regular tests and exams. Things you can do on your own to prevent diseases and keep yourself healthy. What should I know about diet, weight, and exercise? Eat a healthy diet  Eat a diet that includes plenty of vegetables, fruits, low-fat dairy products, and lean protein. Do not eat a lot of foods that are high in solid fats, added sugars, or sodium. Maintain a healthy weight Body mass index (BMI) is used to identify weight problems. It estimates body fat based on height and weight. Your health care provider can help determine your BMI and help you achieve or maintain a healthy weight. Get regular exercise Get regular exercise. This is one of the most important things you can do for your health. Most adults should: Exercise for at least 150 minutes each week. The exercise should increase your heart rate and make you sweat (moderate-intensity exercise). Do strengthening exercises at least twice a week. This is in addition to the moderate-intensity exercise. Spend less time sitting. Even light physical activity can be beneficial. Watch cholesterol and blood lipids Have your blood tested for lipids and cholesterol at 86years of age, then have this test every 5 years. Have your cholesterol levels  checked more often if: Your lipid or cholesterol levels are high. You are older than 86 years of age. You are at high risk for heart disease. What should I know about cancer screening? Depending on your health history and family history, you may need to have cancer  screening at various ages. This may include screening for: Breast cancer. Cervical cancer. Colorectal cancer. Skin cancer. Lung cancer. What should I know about heart disease, diabetes, and high blood pressure? Blood pressure and heart disease High blood pressure causes heart disease and increases the risk of stroke. This is more likely to develop in people who have high blood pressure readings or are overweight. Have your blood pressure checked: Every 3-5 years if you are 59-57 years of age. Every year if you are 59 years old or older. Diabetes Have regular diabetes screenings. This checks your fasting blood sugar level. Have the screening done: Once every three years after age 17 if you are at a normal weight and have a low risk for diabetes. More often and at a younger age if you are overweight or have a high risk for diabetes. What should I know about preventing infection? Hepatitis B If you have a higher risk for hepatitis B, you should be screened for this virus. Talk with your health care provider to find out if you are at risk for hepatitis B infection. Hepatitis C Testing is recommended for: Everyone born from [birth year] through 1963. Anyone with known risk factors for hepatitis C. Sexually transmitted infections (STIs) Get screened for STIs, including gonorrhea and chlamydia, if: You are sexually active and are younger than 86 years of age. You are older than 86 years of age and your health care provider tells you that you are at risk for this type of infection. Your sexual activity has changed since you were last screened, and you are at increased risk for chlamydia or gonorrhea. Ask your health care provider if you are at risk. Ask your health care provider about whether you are at high risk for HIV. Your health care provider may recommend a prescription medicine to help prevent HIV infection. If you choose to take medicine to prevent HIV, you should first get tested for HIV. You  should then be tested every 3 months for as long as you are taking the medicine. Pregnancy If you are about to stop having your period (premenopausal) and you may become pregnant, seek counseling before you get pregnant. Take 400 to 800 micrograms (mcg) of folic acid every day if you become pregnant. Ask for birth control (contraception) if you want to prevent pregnancy. Osteoporosis and menopause Osteoporosis is a disease in which the bones lose minerals and strength with aging. This can result in bone fractures. If you are 63 years old or older, or if you are at risk for osteoporosis and fractures, ask your health care provider if you should: Be screened for bone loss. Take a calcium or vitamin D supplement to lower your risk of fractures. Be given hormone replacement therapy (HRT) to treat symptoms of menopause. Follow these instructions at home: Alcohol use Do not drink alcohol if: Your health care provider tells you not to drink. You are pregnant, may be pregnant, or are planning to become pregnant. If you drink alcohol: Limit how much you have to: 0-1 drink a day. Know how much alcohol is in your drink. In the [place] one drink equals one 12 oz bottle of beer (355 mL), one 5 oz glass of wine (  148 mL), or one 1 oz glass of hard liquor (44 mL). Lifestyle Do not use any products that contain nicotine or tobacco. These products include cigarettes, chewing tobacco, and vaping devices, such as e-cigarettes. If you need help quitting, ask your health care provider. Do not use street drugs. Do not share needles. Ask your health care provider for help if you need support or information about quitting drugs. General instructions Schedule regular health, dental, and eye exams. Stay current with your vaccines. Tell your health care provider if: You often feel depressed. You have ever been abused or do not feel safe at home. Summary Adopting a healthy lifestyle and getting preventive care are  important in promoting health and wellness. Follow your health care provider's instructions about healthy diet, exercising, and getting tested or screened for diseases. Follow your health care provider's instructions on monitoring your cholesterol and blood pressure. This information is not intended to replace advice given to you by your health care provider. Make sure you discuss any questions you have with your health care provider. Document Revised: 02/23/2021 Document Reviewed: 02/23/2021 Elsevier Patient Education  [place].

## 2022-06-29 ENCOUNTER — Encounter: Payer: Self-pay | Admitting: Family Medicine

## 2022-06-29 ENCOUNTER — Telehealth (INDEPENDENT_AMBULATORY_CARE_PROVIDER_SITE_OTHER): Payer: Medicare Other | Admitting: Family Medicine

## 2022-06-29 VITALS — Wt 150.7 lb

## 2022-06-29 DIAGNOSIS — J8410 Pulmonary fibrosis, unspecified: Secondary | ICD-10-CM | POA: Diagnosis not present

## 2022-06-29 DIAGNOSIS — R7309 Other abnormal glucose: Secondary | ICD-10-CM | POA: Diagnosis not present

## 2022-06-29 DIAGNOSIS — I5032 Chronic diastolic (congestive) heart failure: Secondary | ICD-10-CM

## 2022-06-29 MED ORDER — FUROSEMIDE 40 MG PO TABS: 40.0000 mg | ORAL_TABLET | Freq: Every day | ORAL | 1 refills | Status: DC

## 2022-06-29 NOTE — Assessment & Plan Note (Signed)
Typically takes lasix if weight is up 3 lbs.  Take the 2 tabs together instead of apart and see if has a better effect. Continue daily weight.s

## 2022-06-29 NOTE — Progress Notes (Signed)
Virtual Visit via Telephone Note  I connected with Terri Small on 06/29/22 at 11:10 AM EDT by telephone and verified that I am speaking with the correct person using two identifiers.   I discussed the limitations, risks, security and privacy concerns of performing an evaluation and management service by telephone and the availability of in person appointments. I also discussed with the patient that there may be a patient responsible charge related to this service. The patient expressed understanding and agreed to proceed.  Patient location: Provider loccation: In office    Established Patient Office Visit  Subjective   Patient ID: Terri Small, female    DOB: Oct 05, 1931  Age: 88 y.o. MRN: [id number]  Chief Complaint  Patient presents with   Congestive Heart Failure    HPI  65-monthE-visit follow-up.  Follow-up congestive heart failure- she weights herself daily in the AM.  Base weight 150 lbs.  Will take 2 tabs of lasix if weight up to 153. She will usually take one in the AM, and afternoon  Had to take 2 last [place]. Has been having more chest pain and SOB when active.  Last 3-4 minutes.  Feels tight across her shoulders.  She isn't wearing her oxygen when this happens.  Stress test  in 2019 showed no ischemia. Hx of fib but no palpitations.   The last saw her she has had an eye exam as well as a dermatology visit.  She does have age-related macular degeneration.  Was told needs a mohs surgery. Says the lesion peeled out.     ROS    Objective:     Wt 150 lb 11.2 oz (68.4 kg)   BMI 26.70 kg/m    Physical Exam   No results found for any visits on 06/29/22.    The ASCVD Risk score (Arnett DK, et al., 2019) failed to calculate for the following reasons:   The 2019 ASCVD risk score is only valid for ages 464to 771   Assessment & Plan:   Problem List Items Addressed This Visit       Cardiovascular and Mediastinum   Congestive heart failure with left  ventricular diastolic dysfunction ([facility] - Primary    Typically takes lasix if weight is up 3 lbs.  Take the 2 tabs together instead of apart and see if has a better effect. Continue daily weight.s       Relevant Medications   TIADYLT ER 180 MG 24 hr capsule   furosemide (LASIX) 40 MG tablet   Other Relevant Orders   CBC with Differential/Platelet   COMPLETE METABOLIC PANEL WITH GFR   Lipid Panel w/reflex Direct LDL   Hemoglobin A1c   Chronic diastolic heart failure (HCC)   Relevant Medications   TIADYLT ER 180 MG 24 hr capsule   furosemide (LASIX) 40 MG tablet     Respiratory   Pulmonary fibrosis (HCC)    Says has an albuterol inhaler at home.  He did verify that it was in date while we were on the phone.  Discussed using pre-activity to see if notice a difference in her stamin and SOB.  10 you with Breo daily she is consistent with that.      Other Visit Diagnoses     Abnormal glucose       Relevant Orders   CBC with Differential/Platelet   COMPLETE METABOLIC PANEL WITH GFR   Lipid Panel w/reflex Direct LDL   Hemoglobin A1c  Discussed needs up to date labs.  Encouraged her to come in next couple of weeks.    I discussed the assessment and treatment plan with the patient. The patient was provided an opportunity to ask questions and all were answered. The patient agreed with the plan and demonstrated an understanding of the instructions.   The patient was advised to call back or seek an in-person evaluation if the symptoms worsen or if the condition fails to improve as anticipated.  I provided 21 minutes of non-face-to-face time during this encounter.   Beatrice Lecher, MD  No follow-ups on file.    Beatrice Lecher, MD

## 2022-06-29 NOTE — Assessment & Plan Note (Addendum)
Says has an albuterol inhaler at home.  He did verify that it was in date while we were on the phone.  Discussed using pre-activity to see if notice a difference in her stamin and SOB.  10 you with Breo daily she is consistent with that.

## 2022-06-29 NOTE — Progress Notes (Signed)
Pt would like to inform Dr. Madilyn Fireman that she has been having some slight CP when she is active this has been happening over the last 1-2 months. Like when she is active. Pt states that she does not have her O2 on when this is happening and does have some SOB.  She states that she walks fast and has always walked fast and has had to slow down to keep this from happening. She stated that she gets a sensation of a heaviness in her chest, then it moves into her shoulders and jaw. She has to sit down and the feeling goes away after about 3-5 mins.   She hasn't spoken to her cardiologist because she stated that Dr. Madilyn Fireman agreed to take over her care.   She states that she weighs herself daily and when her weight is down she doesn't experience pain as much. She states that this could be a psychological thing but did want to mention this.

## 2022-06-29 NOTE — Progress Notes (Signed)
   Established Patient Office Visit  Subjective   Patient ID: Terri Small, female    DOB: 25-Feb-1931  Age: 88 y.o. MRN: [id number]  Chief Complaint  Patient presents with   Congestive Heart Failure    HPI  60-monthE-visit follow-up.  Follow-up congestive heart failure- she weights herself daily in the AM.  Base weight 150 lbs.  Will take 2 tabs of lasix if weight up to 153.  Had to take 2 last [place] Fri. Has been having more chest pain and SOB when active.  Last 3-4 minutes.  Feels tight across her shoulders.  Stress test  in 2019 showed no ischemia.   The last saw her she has had an eye exam as well as a dermatology visit.  She does have age-related macular degeneration.  Was told needs a mohs surgery. Says the lesion peeled out.     ROS    Objective:     Wt 150 lb 11.2 oz (68.4 kg)   BMI 26.70 kg/m    Physical Exam   No results found for any visits on 06/29/22.    The ASCVD Risk score (Arnett DK, et al., 2019) failed to calculate for the following reasons:   The 2019 ASCVD risk score is only valid for ages 461to 785   Assessment & Plan:   Problem List Items Addressed This Visit       Cardiovascular and Mediastinum   Congestive heart failure with left ventricular diastolic dysfunction ([facility] - Primary    Typically takes lasix if weight is up 3 lbs.  Take the 2 tabs together instead of apart and see if has a better effect. Continue daily weight.s       Relevant Medications   TIADYLT ER 180 MG 24 hr capsule   furosemide (LASIX) 40 MG tablet   Chronic diastolic heart failure (HCC)   Relevant Medications   TIADYLT ER 180 MG 24 hr capsule   furosemide (LASIX) 40 MG tablet     Respiratory   Pulmonary fibrosis (HCC)    Says has an albuterol inhaler at home. Discussed using pre-activity to see if notice a difference in her stamin and SOB.       Other Visit Diagnoses     Abnormal glucose           No follow-ups on file.    CBeatrice Lecher MD

## 2022-07-05 ENCOUNTER — Ambulatory Visit: Payer: Medicare Other | Admitting: Family Medicine

## 2022-07-05 ENCOUNTER — Encounter: Payer: Self-pay | Admitting: Family Medicine

## 2022-07-05 DIAGNOSIS — I5032 Chronic diastolic (congestive) heart failure: Secondary | ICD-10-CM

## 2022-07-05 DIAGNOSIS — J8410 Pulmonary fibrosis, unspecified: Secondary | ICD-10-CM

## 2022-07-05 DIAGNOSIS — Z515 Encounter for palliative care: Secondary | ICD-10-CM

## 2022-07-05 NOTE — Progress Notes (Signed)
[facility] Consult Note Telephone: [phone]  Fax: [phone]    Date of encounter: 07/05/22 12:21 PM PATIENT NAME: Terri Small [address] Terri Small [place] [address]   [phone] (home)  DOB: June 14, 1931 MRN: [id number] PRIMARY CARE PROVIDER:    Hali Marry, MD,  [place] [address] [phone]  REFERRING PROVIDER:   Hali Marry, MD [place],  [place] [address] [phone]  RESPONSIBLE PARTY:    Contact Information     Name Relation Home Work [place] Daughter   [phone]     I connected with  Terri Small on 07/05/22 by a telephone application and verified that I am speaking with the correct person using two identifiers.   I discussed the limitations of evaluation and management by telemedicine. The patient expressed understanding and agreed to proceed.    Palliative Care was asked to follow this patient by consultation request of  Terri Small, * to address advance care planning and complex medical decision making. This is a follow up visit   ASSESSMENT , SYMPTOM MANAGEMENT AND PLAN / RECOMMENDATIONS:   Chronic Diastolic Heart Failure Stable, euvolemic Continue Lasix titrated dose of 40-80 mg, Toprol XL 25 mg & Tiadyl ER 180 mg daily for rate control of afib and anticoagulation on Eliquis 5 mg BID. Continue potassium supplement with diuretic therapy.  2.  Pulmonary fibrosis Stable Continue Breo-Ellipta 1 inhalation daily.  Tessalon perles 200 mg capsule BID prn cough  3.  Palliative Care Encounter Has MOST, daughter Terri Small is [facility] POA.     Advance Care Planning/Goals of Care: Goals include to maximize quality of life and symptom management.  Identification of a healthcare agent-daughter Terri Small Review of an advance directive document-MOST. Decision not to resuscitate or to de-escalate disease focused  treatments due to poor prognosis. CODE STATUS:  MOST as of 03/23/22: DNR/DNI with comfort measures Antibiotics and IV fluids on a case by case time limited basis No feeding tube     Follow up Palliative Care Visit: Palliative care will continue to follow for complex medical decision making, advance care planning, and clarification of goals. Return 4 weeks or prn.    This visit was coded based on medical decision making (MDM).  PPS: 60%  HOSPICE ELIGIBILITY/DIAGNOSIS: TBD  Chief Complaint:   Palliative Care is following pt for chronic medical management of heart failure with atrial fibrillation and pulmonary hypertension.  HISTORY OF PRESENT ILLNESS:  Terri Small is a 88 y.o. year old female with CHF with left ventricular diastolic dysfunction, pulmonary hypertention, cardiomyopathy, moderate aortic stenosis, mitral stenosis and tricuspid regurgitation, pulmonary fibrosis.   PCP thinks that the chest discomfort she feels is probably coming from her legs based off her last stress test 4 year ago.  PCP encouraged her to use the Albuterol when she plans to be more active than usual.  Denies lightheadedness, falls, PND.  Appetite is good.  No changes in meds.  No new infections.    History obtained from review of EMR, discussion with daughter and/or Terri Small.    I reviewed EMR for available labs, medications, imaging, studies and related documents.  There are no new records since last visit.   ROS General: NAD EYES: denies vision changes ENMT: denies dysphagia Cardiovascular: endorses some intermittent chest pain and DOE Pulmonary: denies cough or increased SOB above baseline Abdomen: endorses good appetite, denies constipation, endorses continence  of bowel GU: denies dysuria, endorses continence of urine MSK:  denies increased weakness, no falls reported Neurological: denies pain and insomnia Psych: Endorses positive mood Heme/lymph/immuno: denies bruises, abnormal  bleeding  Physical Exam: limited to audible only Current and past weights: 150 lbs 11.2 ounces as of 06/29/22, 152 lbs as of 03/23/22, 155 lbs 12/15/21  ENMT: intact hearing CV:  Able to speak complete sentences without having to stop to breathe Pulmonary: no audible increased work of breathing/cough/wheezes Neuro:   no cognitive impairment Psych: non-anxious affect, A and O x 3  Total phone time:  17 min   Thank you for the opportunity to participate in the care of [name].  The palliative care team will continue to follow. Please call our office at [phone] if we can be of additional assistance.   Terri Conception, FNP -C  COVID-19 PATIENT SCREENING TOOL Asked and negative response unless otherwise noted:   Have you had symptoms of covid, tested positive or been in contact with someone with symptoms/positive test in the past 5-10 days?  No

## 2022-07-22 ENCOUNTER — Telehealth: Payer: Self-pay

## 2022-07-22 ENCOUNTER — Other Ambulatory Visit: Payer: Self-pay

## 2022-07-22 ENCOUNTER — Telehealth (INDEPENDENT_AMBULATORY_CARE_PROVIDER_SITE_OTHER): Payer: Medicare Other | Admitting: Family Medicine

## 2022-07-22 DIAGNOSIS — N3000 Acute cystitis without hematuria: Secondary | ICD-10-CM | POA: Diagnosis not present

## 2022-07-22 DIAGNOSIS — R051 Acute cough: Secondary | ICD-10-CM

## 2022-07-22 MED ORDER — BENZONATATE 200 MG PO CAPS: 200.0000 mg | ORAL_CAPSULE | Freq: Two times a day (BID) | ORAL | 1 refills | Status: DC | PRN

## 2022-07-22 MED ORDER — CEPHALEXIN 500 MG PO CAPS: 500.0000 mg | ORAL_CAPSULE | Freq: Two times a day (BID) | ORAL | 0 refills | Status: DC

## 2022-07-22 NOTE — Telephone Encounter (Signed)
Routing to covering provider.  Patient left a vm msg on assistant's line stating she has a history of chronic UTI. Patient does not want to come into the office, if possible. Per patient, currently having symptoms. Requesting if provider can send in a antibiotic rx to the pharmacy.

## 2022-07-22 NOTE — Progress Notes (Signed)
   Virtual Visit via Telephone Note  I connected with Terri Small on 07/22/22 at 11:30 AM EDT by telephone and verified that I am speaking with the correct person using two identifiers.   I discussed the limitations, risks, security and privacy concerns of performing an evaluation and management service by telephone and the availability of in person appointments. I also discussed with the patient that there may be a patient responsible charge related to this service. The patient expressed understanding and agreed to proceed.  Patient location: Provider loccation: In office   Subjective:    CC:  No chief complaint on file.   HPI:  Woke up urinary sxs yesterday.  + cloudy urine.  + dysuria.  No back pain. Had to take Tylenol last night bc  of discomfort. Did a test with strips yesterday.  + leuks and nitrates. Feels better after takes her lasix.  Took two tabs this AM. No hematuria. No fever.    Past medical history, Surgical history, Family history not pertinant except as noted below, Social history, Allergies, and medications have been entered into the medical record, reviewed, and corrections made.   Review of Systems: No fevers, chills, night sweats, weight loss, chest pain, or shortness of breath.   Objective:    General: Speaking clearly in complete sentences without any shortness of breath.  Alert and oriented x3.  Normal judgment. No apparent acute distress.    Impression and Recommendations:    Problem List Items Addressed This Visit   None Visit Diagnoses     Acute cystitis without hematuria    -  Primary   Relevant Medications   cephALEXin (KEFLEX) 500 MG capsule       Meds ordered this encounter  Medications   cephALEXin (KEFLEX) 500 MG capsule    Sig: Take 1 capsule (500 mg total) by mouth 2 (two) times daily.    Dispense:  10 capsule    Refill:  0    Meds ordered this encounter  Medications   cephALEXin (KEFLEX) 500 MG capsule    Sig: Take 1 capsule  (500 mg total) by mouth 2 (two) times daily.    Dispense:  10 capsule    Refill:  0     I discussed the assessment and treatment plan with the patient. The patient was provided an opportunity to ask questions and all were answered. The patient agreed with the plan and demonstrated an understanding of the instructions.   The patient was advised to call back or seek an in-person evaluation if the symptoms worsen or if the condition fails to improve as anticipated.  I provided 11 minutes of non-face-to-face time during this encounter.   Beatrice Lecher, MD

## 2022-07-22 NOTE — Telephone Encounter (Signed)
Needs appt. We can do a tele or virtual. I have availability today

## 2022-07-22 NOTE — Telephone Encounter (Signed)
Please contact the patient to schedule a tel/virtual appointment with provider. Thanks in advance.

## 2022-07-22 NOTE — Telephone Encounter (Signed)
Patient was seen as a double booking w Dr. Madilyn Fireman at noon. Terri Small

## 2022-08-12 ENCOUNTER — Other Ambulatory Visit: Payer: Self-pay | Admitting: Family Medicine

## 2022-08-12 DIAGNOSIS — I5032 Chronic diastolic (congestive) heart failure: Secondary | ICD-10-CM

## 2022-08-18 ENCOUNTER — Other Ambulatory Visit: Payer: Medicare Other | Admitting: Family Medicine

## 2022-08-18 VITALS — BP 124/70 | HR 70 | Wt 151.9 lb

## 2022-08-18 DIAGNOSIS — I5032 Chronic diastolic (congestive) heart failure: Secondary | ICD-10-CM

## 2022-08-18 DIAGNOSIS — I4891 Unspecified atrial fibrillation: Secondary | ICD-10-CM

## 2022-08-18 DIAGNOSIS — J8410 Pulmonary fibrosis, unspecified: Secondary | ICD-10-CM

## 2022-08-18 NOTE — Progress Notes (Signed)
[facility] Consult Note Telephone: [phone]  Fax: [phone]    Date of encounter: 08/18/22 10:10 AM PATIENT NAME: Terri Small [address] [place] [address]   [phone] (home)  DOB: 1931-02-19 MRN: [id number] PRIMARY CARE PROVIDER:    Hali Marry, MD,  [place] [address] [phone]  REFERRING PROVIDER:   Hali Marry, MD [place],  [place] [address] [phone]  RESPONSIBLE PARTY:    Contact Information     Name Relation Home Work [place] Daughter   [phone]        I met face to face with patient in her home. Palliative Care was asked to follow this patient by consultation request of  Hali Marry, * to address advance care planning and complex medical decision making. This is a follow up visit   ASSESSMENT , SYMPTOM MANAGEMENT AND PLAN / RECOMMENDATIONS:   Chronic diastolic heart failure Stable, discussed energy conservation techniques and pacing of activities. Encouraged to use O2 before and after to see if this will impact fatigue and pain symptoms. Appears euvolemic   Atrial fibrillation Rate controlled, stable on anticoagulation   Pulmonary Fibrosis Stable Continue Breo-Ellipta 1 inhalation daily.  Tessalon perles 200 mg capsule BID prn cough   Advance Care Planning/Goals of Care:  Goals include to maximize quality of life and symptom management.  Identification of a healthcare agent-daughter [place] Review of an advance directive document-MOST. Decision not to resuscitate or to de-escalate disease focused treatments due to poor prognosis. CODE STATUS:   MOST as of 03/23/22: DNR/DNI with comfort measures Antibiotics and IV fluids on a case by case time limited basis No feeding tube      Follow up Palliative Care Visit: Per mutual agreement with patient and Palliative Care, pt  will defer further follow up at present time until such time as condition changes and pt/family need additional support then patient can be referred back to Palliative Care.    This visit was coded based on medical decision making (MDM).  PPS: 60%  HOSPICE ELIGIBILITY/DIAGNOSIS: TBD  Chief Complaint:  Palliative Care is following pt for chronic medical management of heart failure with atrial fibrillation and pulmonary hypertension.    HISTORY OF PRESENT ILLNESS:  Terri Small is a 88 y.o. year old female with CHF with left ventricular diastolic dysfunction, pulmonary hypertention, cardiomyopathy, moderate aortic stenosis, mitral stenosis and tricuspid regurgitation, pulmonary fibrosis.  Has intermittent brief CP when overextends or over exerts herself.  Had cardiac work up which was normal 2 years ago. Doesn't use O2 during the day, uses 2L [place] overnight.  Can increase Lasix if needed. Can take an extra Lasix if her weight gain and stays.  No PND, orthopnea.  No trouble sleeping. Had UTI about 2 weeks ago and completed her antibiotics. Pt feels like she is stable and doing well at the present time, does have to pace her activities  History obtained from review of EMR, discussion with Terri Small.  I reviewed EMR for available labs, medications, imaging, studies and related documents.  There are no new records since last visit.   ROS General: NAD ENMT: denies dysphagia Cardiovascular: denies chest pain unless overexerting, denies DOE Pulmonary: denies cough, denies worsening SOB Abdomen: endorses good appetite, denies constipation, endorses continence of bowel GU: denies dysuria, endorses stress incontinence of urine MSK:  denies increased weakness, no falls reported Skin:  denies rashes or wounds Neurological: denies pain, denies insomnia Psych: Endorses positive mood Heme/lymph/immuno: denies bruises, abnormal bleeding  Physical Exam: Current and past weights: weight 151.9 lbs today   Constitutional: NAD General: WNWD ENMT: intact hearing CV: S1S2, IRIR, no LE edema Pulmonary: CTAB, no increased work of breathing, no cough, room air at present Abdomen: normo-active BS + 4 quadrants, soft and non tender MSK: no sarcopenia, moves all extremities, ambulatory Neuro:  no generalized weakness, no cognitive impairment Psych: non-anxious affect, A and O x 3 Hem/lymph/immuno: no widespread bruising   Thank you for the opportunity to participate in the care of Terri Small.  The palliative care team will continue to follow. Please call our office at [phone] if we can be of additional assistance.   Marijo Conception, FNP -C  COVID-19 PATIENT SCREENING TOOL Asked and negative response unless otherwise noted:   Have you had symptoms of covid, tested positive or been in contact with someone with symptoms/positive test in the past 5-10 days?

## 2022-08-31 ENCOUNTER — Other Ambulatory Visit: Payer: Self-pay | Admitting: Family Medicine

## 2022-08-31 DIAGNOSIS — I5032 Chronic diastolic (congestive) heart failure: Secondary | ICD-10-CM

## 2022-08-31 DIAGNOSIS — I4891 Unspecified atrial fibrillation: Secondary | ICD-10-CM

## 2022-08-31 MED ORDER — POTASSIUM CHLORIDE ER 8 MEQ PO TBCR: 20.0000 meq | EXTENDED_RELEASE_TABLET | Freq: Every day | ORAL | 3 refills | Status: DC

## 2022-08-31 NOTE — Telephone Encounter (Signed)
Patient called left vm states she is one day left on her heart meds and needs refills she ask if someone could send those over to the pharmacy please.

## 2022-08-31 NOTE — Telephone Encounter (Signed)
I am going to need more clarification in this as well as pended medications.

## 2022-09-01 ENCOUNTER — Telehealth: Payer: Self-pay | Admitting: General Practice

## 2022-09-01 MED ORDER — APIXABAN 5 MG PO TABS: 5.0000 mg | ORAL_TABLET | Freq: Two times a day (BID) | ORAL | 2 refills | Status: DC

## 2022-09-01 MED ORDER — DILTIAZEM HCL ER BEADS 180 MG PO CP24: 180.0000 mg | ORAL_CAPSULE | Freq: Every day | ORAL | 1 refills | Status: DC

## 2022-09-01 MED ORDER — METOPROLOL SUCCINATE ER 25 MG PO TB24: 25.0000 mg | ORAL_TABLET | Freq: Every day | ORAL | 3 refills | Status: DC

## 2022-09-01 NOTE — Telephone Encounter (Signed)
Patient's daughter has been updated and thanks the provider for expediting the request.

## 2022-09-01 NOTE — Telephone Encounter (Signed)
Medication refills sent

## 2022-09-01 NOTE — Addendum Note (Signed)
Addended by: Beatrice Lecher D on: 09/01/2022 03:09 PM   Modules accepted: Orders

## 2022-09-01 NOTE — Telephone Encounter (Signed)
Transition Care Management Unsuccessful Follow-up Telephone Call  Date of discharge and from where:  08/31/22 from [facility]  Attempts:  1st Attempt  Reason for unsuccessful TCM follow-up call:  Left voice message

## 2022-09-03 NOTE — Telephone Encounter (Signed)
Transition Care Management Unsuccessful Follow-up Telephone Call  Date of discharge and from where:  08/31/22 from [facility]  Attempts:  2nd Attempt  Reason for unsuccessful TCM follow-up call:  Left voice message

## 2022-09-06 ENCOUNTER — Encounter: Payer: Self-pay | Admitting: Family Medicine

## 2022-09-06 ENCOUNTER — Other Ambulatory Visit: Payer: Self-pay | Admitting: *Deleted

## 2022-09-06 MED ORDER — POTASSIUM CHLORIDE CRYS ER 20 MEQ PO TBCR: 20.0000 meq | EXTENDED_RELEASE_TABLET | Freq: Every day | ORAL | 1 refills | Status: DC

## 2022-09-06 NOTE — Telephone Encounter (Signed)
Transition Care Management Unsuccessful Follow-up Telephone Call  Date of discharge and from where:  08/31/22 from [facility]  Attempts:  3rd Attempt  Reason for unsuccessful TCM follow-up call:  Left voice message

## 2022-09-21 ENCOUNTER — Other Ambulatory Visit: Payer: Medicare Other | Admitting: Family Medicine

## 2022-09-29 ENCOUNTER — Other Ambulatory Visit: Payer: Self-pay | Admitting: Family Medicine

## 2022-09-29 DIAGNOSIS — I5032 Chronic diastolic (congestive) heart failure: Secondary | ICD-10-CM

## 2022-10-19 ENCOUNTER — Encounter: Payer: Self-pay | Admitting: Family Medicine

## 2022-10-19 ENCOUNTER — Ambulatory Visit (INDEPENDENT_AMBULATORY_CARE_PROVIDER_SITE_OTHER): Payer: Medicare Other | Admitting: Family Medicine

## 2022-10-19 VITALS — BP 135/73 | HR 70 | Ht 63.0 in | Wt 159.0 lb

## 2022-10-19 DIAGNOSIS — I4891 Unspecified atrial fibrillation: Secondary | ICD-10-CM | POA: Diagnosis not present

## 2022-10-19 DIAGNOSIS — R051 Acute cough: Secondary | ICD-10-CM

## 2022-10-19 DIAGNOSIS — J8410 Pulmonary fibrosis, unspecified: Secondary | ICD-10-CM | POA: Diagnosis not present

## 2022-10-19 DIAGNOSIS — J22 Unspecified acute lower respiratory infection: Secondary | ICD-10-CM

## 2022-10-19 DIAGNOSIS — R059 Cough, unspecified: Secondary | ICD-10-CM | POA: Diagnosis not present

## 2022-10-19 DIAGNOSIS — I5032 Chronic diastolic (congestive) heart failure: Secondary | ICD-10-CM | POA: Diagnosis not present

## 2022-10-19 LAB — POC COVID19 BINAXNOW: SARS Coronavirus 2 Ag: NEGATIVE

## 2022-10-19 MED ORDER — BENZONATATE 200 MG PO CAPS: 200.0000 mg | ORAL_CAPSULE | Freq: Two times a day (BID) | ORAL | 1 refills | Status: DC | PRN

## 2022-10-19 MED ORDER — PREDNISONE 20 MG PO TABS: 40.0000 mg | ORAL_TABLET | Freq: Every day | ORAL | 0 refills | Status: DC

## 2022-10-19 NOTE — Assessment & Plan Note (Signed)
Will go ahead and place new referral for palliative care.  They were coming in quarterly to check on her.  She received a letter from with your care stating that they would no longer return.  Would like new consultation.  Referral placed.

## 2022-10-19 NOTE — Assessment & Plan Note (Signed)
Does have some trace pitting edema in her ankles and does have a dried scab on the right inner lower leg that appears to be healing.

## 2022-10-19 NOTE — Assessment & Plan Note (Signed)
Continue with Eliquis and diltiazem.

## 2022-10-19 NOTE — Progress Notes (Addendum)
Established Patient Office Visit  Subjective   Patient ID: Terri Small, female    DOB: [birth year]  Age: 89 y.o. MRN: [id number]  Chief Complaint  Patient presents with   Congestive Heart Failure   Cough    HPI  Follow-up pulmonary fibrosis-currently on Breo.  Does wear her oxygen at night.  She does not wear it during the day because she does not like being tied down to it she can usually just stop what she is doing if she feels short of breath and do her breathing exercises and feel better.  Follow-up congestive heart failure-weight is up about 8 pounds since she was here in November.  She did notice a slight increase in swelling recently when she quit drinking her tea.  But recently restarted it and noticed her swelling was actually better.  She also started with a cough yesterday.  Her daughter who she lives with and son-in-law both had respiratory illnesses that required urgent care visits and they both received prednisone and antibiotics to get better.  She denies any sore throat or nasal congestion it is mostly just a productive cough.  She does have a history of pulmonary fibrosis.  She has a wound on her right lower inner leg that has been there for about 8 weeks and it is gradually improving. So wanted let me know that she was dropped from with your care.     ROS    Objective:     BP 135/73   Pulse 70   Ht '5\' 3"'$  (1.6 m)   Wt 159 lb (72.1 kg)   SpO2 94%   BMI 28.17 kg/m    Physical Exam Constitutional:      Appearance: She is well-developed.  HENT:     Head: Normocephalic and atraumatic.     Right Ear: External ear normal.     Left Ear: External ear normal.     Nose: Nose normal.  Eyes:     Conjunctiva/sclera: Conjunctivae normal.     Pupils: Pupils are equal, round, and reactive to light.  Neck:     Thyroid: No thyromegaly.  Cardiovascular:     Rate and Rhythm: Normal rate and regular rhythm.     Heart sounds: Normal heart sounds.  Pulmonary:      Effort: Pulmonary effort is normal.     Breath sounds: Normal breath sounds. No wheezing.  Musculoskeletal:     Cervical back: Neck supple.  Lymphadenopathy:     Cervical: No cervical adenopathy.  Skin:    General: Skin is warm and dry.     Comments: Trace edema in both ankles.  There is a thick scab that looks dry and scaly on the right lower inner leg.  Just some slight erythema right around the border.  Neurological:     Mental Status: She is alert and oriented to person, place, and time.      Results for orders placed or performed in visit on 10/19/22  POC COVID-19  Result Value Ref Range   SARS Coronavirus 2 Ag Negative Negative      The ASCVD Risk score (Arnett DK, et al., 2019) failed to calculate for the following reasons:   The 2019 ASCVD risk score is only valid for ages 101 to 78    Assessment & Plan:   Problem List Items Addressed This Visit       Cardiovascular and Mediastinum   Congestive heart failure with left ventricular diastolic dysfunction (HCC)    Does  have some trace pitting edema in her ankles and does have a dried scab on the right inner lower leg that appears to be healing.      Relevant Orders   Amb Referral to Palliative Care   Chronic diastolic heart failure (HCC)   Atrial fibrillation ([place])    Continue with Eliquis and diltiazem.        Respiratory   Pulmonary fibrosis ([place]) - Primary    Will go ahead and place new referral for palliative care.  They were coming in quarterly to check on her.  She received a letter from with your care stating that they would no longer return.  Would like new consultation.  Referral placed.      Relevant Orders   Amb Referral to Palliative Care   Other Visit Diagnoses     Cough, unspecified type       Relevant Orders   POC COVID-19 (Completed)   RSV screen (nasopharyngeal)not at [facility]   Acute cough       Relevant Medications   benzonatate (TESSALON) 200 MG capsule   Lower respiratory infection  (e.g., bronchitis, pneumonia, pneumonitis, pulmonitis)           Lower respiratory tract infection-negative for COVID.  We did send off for RSV.  She does not have a fever so we did not test for flu today.  We did discuss the possibility of getting an RSV vaccine especially if she is negative when she is feeling better.  In the meantime we will send in prednisone and possible Z-Pak.  Refilled Tessalon Perles.  She uses those more chronically.  Wound that appears to be healing on the right lower inner leg-we did discuss using an Ace wrap for compression to help with wound healing and to continue to keep it moisturized with her Vaseline.  Also due for updated labs.  Reprinted orders from September.  Referral placed to palliative care.  Return in about 4 months (around 02/17/2023) for heart failure.   I spent 40 minutes on the day of the encounter to include pre-visit record review, face-to-face time with the patient and post visit ordering of test.   Beatrice Lecher, MD

## 2022-10-20 LAB — RSV SCREEN (NASOPHARYNGEAL) NOT AT ARMC
MICRO NUMBER:: 14381505
RESULT:: NOT DETECTED
SPECIMEN QUALITY:: ADEQUATE

## 2022-10-20 MED ORDER — AZITHROMYCIN 250 MG PO TABS: ORAL_TABLET | ORAL | 0 refills | Status: AC

## 2022-10-20 NOTE — Progress Notes (Signed)
HI Dyann Ruddle,  Your red blood cells are on the large side so we called the lab to see if they can add a B12 and a folate level just to make sure that you do not have low B12 or low folate.  Will let you know.  Kidney function is stable at 1.0.  You do look better hydrated compared to last time but still a little bit on the dry side.  Liver function is normal.  A1c is 6.1 which is in the prediabetes range.  Avoid eating too many sweets or sweetened beverages.  Cholesterol looks fantastic.

## 2022-10-20 NOTE — Addendum Note (Signed)
Addended by: Beatrice Lecher D on: 10/20/2022 08:41 AM   Modules accepted: Orders

## 2022-10-20 NOTE — Progress Notes (Signed)
HI Terri Small, negative for RSV.  I sent over the prednisone yesterday a.m., send over an antibiotic today.

## 2022-10-20 NOTE — Progress Notes (Signed)
Please call lab and see if we can add a B12 and folate level use macrocytic anemia as diagnosis.

## 2022-10-21 LAB — COMPLETE METABOLIC PANEL WITH GFR
AG Ratio: 1.7 (calc) (ref 1.0–2.5)
ALT: 13 U/L (ref 6–29)
AST: 18 U/L (ref 10–35)
Albumin: 4.3 g/dL (ref 3.6–5.1)
Alkaline phosphatase (APISO): 78 U/L (ref 37–153)
BUN/Creatinine Ratio: 28 (calc) — ABNORMAL HIGH (ref 6–22)
BUN: 29 mg/dL — ABNORMAL HIGH (ref 7–25)
CO2: 26 mmol/L (ref 20–32)
Calcium: 9.5 mg/dL (ref 8.6–10.4)
Chloride: 106 mmol/L (ref 98–110)
Creat: 1.04 mg/dL — ABNORMAL HIGH (ref 0.60–0.95)
Globulin: 2.6 g/dL (calc) (ref 1.9–3.7)
Glucose, Bld: 105 mg/dL — ABNORMAL HIGH (ref 65–99)
Potassium: 4.5 mmol/L (ref 3.5–5.3)
Sodium: 143 mmol/L (ref 135–146)
Total Bilirubin: 1 mg/dL (ref 0.2–1.2)
Total Protein: 6.9 g/dL (ref 6.1–8.1)
eGFR: 51 mL/min/{1.73_m2} — ABNORMAL LOW (ref >=60)

## 2022-10-21 LAB — CBC WITH DIFFERENTIAL/PLATELET
Absolute Monocytes: 502 cells/uL (ref 200–950)
Basophils Absolute: 33 cells/uL (ref 0–200)
Basophils Relative: 0.5 %
Eosinophils Absolute: 112 cells/uL (ref 15–500)
Eosinophils Relative: 1.7 %
HCT: 40.4 % (ref 35.0–45.0)
Hemoglobin: 13.6 g/dL (ref 11.7–15.5)
Lymphs Abs: 1142 cells/uL (ref 850–3900)
MCH: 34.1 pg — ABNORMAL HIGH (ref 27.0–33.0)
MCHC: 33.7 g/dL (ref 32.0–36.0)
MCV: 101.3 fL — ABNORMAL HIGH (ref 80.0–100.0)
MPV: 9.4 fL (ref 7.5–12.5)
Monocytes Relative: 7.6 %
Neutro Abs: 4811 cells/uL (ref 1500–7800)
Neutrophils Relative %: 72.9 %
Platelets: 191 10*3/uL (ref 140–400)
RBC: 3.99 10*6/uL (ref 3.80–5.10)
RDW: 12.1 % (ref 11.0–15.0)
Total Lymphocyte: 17.3 %
WBC: 6.6 10*3/uL (ref 3.8–10.8)

## 2022-10-21 LAB — LIPID PANEL W/REFLEX DIRECT LDL
Cholesterol: 151 mg/dL (ref <200)
HDL: 66 mg/dL (ref >=50)
LDL Cholesterol (Calc): 69 mg/dL (calc)
Non-HDL Cholesterol (Calc): 85 mg/dL (calc) (ref <130)
Total CHOL/HDL Ratio: 2.3 (calc) (ref <5.0)
Triglycerides: 82 mg/dL (ref <150)

## 2022-10-21 LAB — HEMOGLOBIN A1C
Hgb A1c MFr Bld: 6.1 % of total Hgb — ABNORMAL HIGH (ref <5.7)
Mean Plasma Glucose: 128 mg/dL
eAG (mmol/L): 7.1 mmol/L

## 2022-10-21 LAB — B12 AND FOLATE PANEL
Folate: 15.9 ng/mL
Vitamin B-12: 258 pg/mL (ref 200–1100)

## 2022-10-21 NOTE — Progress Notes (Signed)
Terri Small, your B12 is technically in the normal range but it is definitely on the low end of normal.  I recommend picking up some over-the-counter B12 and just taking 1 tab daily.  Can always recheck your levels again in 3 to 4 months.  A1c is in the prediabetes range so just encourage you to avoid excess sugars and carbs.

## 2022-11-25 ENCOUNTER — Other Ambulatory Visit: Payer: Self-pay | Admitting: Family Medicine

## 2022-11-25 DIAGNOSIS — J449 Chronic obstructive pulmonary disease, unspecified: Secondary | ICD-10-CM

## 2022-11-29 ENCOUNTER — Other Ambulatory Visit: Payer: Self-pay | Admitting: Family Medicine

## 2022-11-29 DIAGNOSIS — I5032 Chronic diastolic (congestive) heart failure: Secondary | ICD-10-CM

## 2022-12-15 ENCOUNTER — Telehealth: Payer: Self-pay | Admitting: *Deleted

## 2022-12-15 NOTE — Telephone Encounter (Signed)
Pt's son called and asked that FMLA forms be updated for him. He is going to email these.

## 2023-01-07 ENCOUNTER — Encounter: Payer: Self-pay | Admitting: Family Medicine

## 2023-01-07 ENCOUNTER — Telehealth (INDEPENDENT_AMBULATORY_CARE_PROVIDER_SITE_OTHER): Payer: Medicare Other | Admitting: Family Medicine

## 2023-01-07 VITALS — HR 78

## 2023-01-07 DIAGNOSIS — J8410 Pulmonary fibrosis, unspecified: Secondary | ICD-10-CM

## 2023-01-07 DIAGNOSIS — I5032 Chronic diastolic (congestive) heart failure: Secondary | ICD-10-CM

## 2023-01-07 DIAGNOSIS — R051 Acute cough: Secondary | ICD-10-CM

## 2023-01-07 DIAGNOSIS — J22 Unspecified acute lower respiratory infection: Secondary | ICD-10-CM | POA: Diagnosis not present

## 2023-01-07 MED ORDER — ALBUTEROL SULFATE HFA 108 (90 BASE) MCG/ACT IN AERS: 2.0000 | INHALATION_SPRAY | Freq: Four times a day (QID) | RESPIRATORY_TRACT | 2 refills | Status: DC | PRN

## 2023-01-07 MED ORDER — PREDNISONE 20 MG PO TABS: 40.0000 mg | ORAL_TABLET | Freq: Every day | ORAL | 0 refills | Status: DC

## 2023-01-07 MED ORDER — AZITHROMYCIN 250 MG PO TABS: ORAL_TABLET | ORAL | 0 refills | Status: AC

## 2023-01-07 NOTE — Progress Notes (Signed)
Pt's daughter feels that her mother has been distant today.  She states that she has been on Oxygen (2L) around the clock now. She has been doubling her Lasix and had lost 2-3 lbs. Her appetite has decreased.   She has spoken to Palliative care and was told that this would be something to discuss with Dr. Madilyn Fireman to see what the recommendations would be considering she has CHF.  No f/s/c, no urinary issues, no changes to her bm's   Her O2 did get up to 98% after she took a few deep breaths.

## 2023-01-07 NOTE — Progress Notes (Signed)
Virtual Visit via Video Note  I connected with Terri Small on 01/07/23 at  1:00 PM EDT by a video enabled telemedicine application and verified that I am speaking with the correct person using two identifiers.   I discussed the limitations of evaluation and management by telemedicine and the availability of in person appointments. The patient expressed understanding and agreed to proceed.  Patient location: at home Provider location: in office  Subjective:    CC:   Chief Complaint  Patient presents with   Weight Gain    HPI:   89 yo female with hx of diastolic HF and pulm fibrosis c/o of SOB. More SOB last week and having to wear oxygen 24 -7.  Cough is worse,using robitussin. + fatigue, dec appetite.  No fever.  Dry weight 153 , no up to 154.9 weight.  Dry sinuses. No sick contacts. Took 2 lasix today today.  She will typically take an extra if her weight is up.  She has not noticed any extremity swelling.  Her oxygen was dropping down into the 70s and then 80s.  She is currently wearing 2 L.   Past medical history, Surgical history, Family history not pertinant except as noted below, Social history, Allergies, and medications have been entered into the medical record, reviewed, and corrections made.    Objective:    General: Speaking clearly in complete sentences without any shortness of breath.  Alert and oriented x3.  Normal judgment. No apparent acute distress.    Impression and Recommendations:    Problem List Items Addressed This Visit       Cardiovascular and Mediastinum   Congestive heart failure with left ventricular diastolic dysfunction (HCC)     Respiratory   Pulmonary fibrosis (HCC) - Primary   Relevant Medications   predniSONE (DELTASONE) 20 MG tablet   azithromycin (ZITHROMAX) 250 MG tablet   Other Visit Diagnoses     Acute cough       Relevant Medications   predniSONE (DELTASONE) 20 MG tablet   azithromycin (ZITHROMAX) 250 MG tablet   Lower  respiratory infection       Relevant Medications   azithromycin (ZITHROMAX) 250 MG tablet      Respiratory infection with a history of pulmonary fibrosis-will treat with azithromycin and a round of prednisone she is not feeling better after the weekend then we can get a chest x-ray if needed.  Right now it sounds like she is managing her fluid and volume status pretty well it does not sound like she is volume overloaded.  If she gets worse over the weekend she can seek emergency care.  She is currently followed by palliative care.  If she is unable to maintain her oxygen then recommend emergent care as well.  Okay to increase oxygen to 3 L if needed but not more than that.   No orders of the defined types were placed in this encounter.   Meds ordered this encounter  Medications   predniSONE (DELTASONE) 20 MG tablet    Sig: Take 2 tablets (40 mg total) by mouth daily with breakfast.    Dispense:  10 tablet    Refill:  0   azithromycin (ZITHROMAX) 250 MG tablet    Sig: 2 Ttabs PO on Day 1, then one a day x 4 days.    Dispense:  6 tablet    Refill:  0   albuterol (VENTOLIN HFA) 108 (90 Base) MCG/ACT inhaler    Sig: Inhale 2-4 puffs  into the lungs every 6 (six) hours as needed for wheezing or shortness of breath.    Dispense:  18 g    Refill:  2    I discussed the assessment and treatment plan with the patient. The patient was provided an opportunity to ask questions and all were answered. The patient agreed with the plan and demonstrated an understanding of the instructions.   The patient was advised to call back or seek an in-person evaluation if the symptoms worsen or if the condition fails to improve as anticipated.   Beatrice Lecher, MD

## 2023-01-31 ENCOUNTER — Telehealth: Payer: Self-pay | Admitting: *Deleted

## 2023-01-31 DIAGNOSIS — J449 Chronic obstructive pulmonary disease, unspecified: Secondary | ICD-10-CM

## 2023-01-31 MED ORDER — AMBULATORY NON FORMULARY MEDICATION: 0 refills | Status: DC

## 2023-01-31 NOTE — Telephone Encounter (Signed)
Pt's daughter called and stated that since the weather has changed she is wanting to get her mother outside more but due to her O2 tanks being so large and bulky this is difficult and she reached out to [employer] [facility] and was told to ask that her mother's pcp write a prescription for to have E-tanks and and Cart. They advised that she get 3-4 of these.  Order placed for this.

## 2023-01-31 NOTE — Telephone Encounter (Signed)
Order for O2 tanks faxed to [employer] [facility];

## 2023-02-04 ENCOUNTER — Telehealth: Payer: Self-pay | Admitting: Family Medicine

## 2023-02-04 NOTE — Telephone Encounter (Signed)
The request for the e tanks and e cart was sent on 01/31/2023 to [employer]. as per patients daughters request and confirmation received.

## 2023-02-04 NOTE — Telephone Encounter (Signed)
Terri Small and Etank cart Patient request From [employer] [phone]

## 2023-02-21 ENCOUNTER — Other Ambulatory Visit: Payer: Self-pay | Admitting: Family Medicine

## 2023-03-21 ENCOUNTER — Telehealth (INDEPENDENT_AMBULATORY_CARE_PROVIDER_SITE_OTHER): Payer: Medicare Other | Admitting: Family Medicine

## 2023-03-21 ENCOUNTER — Encounter: Payer: Self-pay | Admitting: Family Medicine

## 2023-03-21 VITALS — HR 87 | Wt 153.9 lb

## 2023-03-21 DIAGNOSIS — I4891 Unspecified atrial fibrillation: Secondary | ICD-10-CM

## 2023-03-21 DIAGNOSIS — E538 Deficiency of other specified B group vitamins: Secondary | ICD-10-CM

## 2023-03-21 DIAGNOSIS — J8410 Pulmonary fibrosis, unspecified: Secondary | ICD-10-CM

## 2023-03-21 DIAGNOSIS — I5032 Chronic diastolic (congestive) heart failure: Secondary | ICD-10-CM | POA: Diagnosis not present

## 2023-03-21 DIAGNOSIS — R7301 Impaired fasting glucose: Secondary | ICD-10-CM | POA: Insufficient documentation

## 2023-03-21 DIAGNOSIS — R7309 Other abnormal glucose: Secondary | ICD-10-CM

## 2023-03-21 DIAGNOSIS — R051 Acute cough: Secondary | ICD-10-CM

## 2023-03-21 DIAGNOSIS — Z9981 Dependence on supplemental oxygen: Secondary | ICD-10-CM

## 2023-03-21 MED ORDER — DILTIAZEM HCL ER BEADS 180 MG PO CP24: 180.0000 mg | ORAL_CAPSULE | Freq: Every day | ORAL | 3 refills | Status: DC

## 2023-03-21 MED ORDER — BENZONATATE 200 MG PO CAPS
200.0000 mg | ORAL_CAPSULE | Freq: Two times a day (BID) | ORAL | 1 refills | Status: DC | PRN
Start: 2023-03-21 — End: 2023-09-21

## 2023-03-21 NOTE — Assessment & Plan Note (Addendum)
-   Continue Eliquis 

## 2023-03-21 NOTE — Assessment & Plan Note (Signed)
No evidence of volume overload.  Her weight is actually been really stable.  She is due for labs in July just to check renal function and potassium.  Continue metoprolol, furosemide, potassium.

## 2023-03-21 NOTE — Progress Notes (Signed)
Virtual Visit via Video Note  I connected with Terri Small on 03/21/23 at 10:30 AM EDT by a video enabled telemedicine application and verified that I am speaking with the correct person using two identifiers.   I discussed the limitations of evaluation and management by telemedicine and the availability of in person appointments. The patient expressed understanding and agreed to proceed.  Patient location: at home Provider location: in office  Subjective:    CC:   Chief Complaint  Patient presents with   Congestive Heart Failure    HPI:  89 yo female with hx of diastolic HF and pulm fibrosis.  She is doing well overall.  She does usually have a little bit of a mucousy cough in the mornings.  No recent sickness or illness.  Last time she was treated with antibiotics was in March.  She says this morning she had a pretty significant coughing fit it was hard to breathe and if she is able to swallow some water. She is currently wearing 2 L.  So she still takes 1 Tessalon Perle daily in the mornings.  Dry weight 153 lbs   with 153.9.  She did she actually get down to about 150 pounds a couple of months ago. she has not noticed any extremity swelling.  She does typically wear her compression stockings.  She always notices a little bit of swelling usually a little greater on the left compared to the right.  No excess swelling though..  She is currently wearing 2 L.   B12 was noted to be low back in January she has been taking an over-the-counter supplement and is due to recheck those levels.  So A1c was mildly elevated at 6.1 in January she is due to recheck that again next month encouraged her to try to come for labs in July.  Past medical history, Surgical history, Family history not pertinant except as noted below, Social history, Allergies, and medications have been entered into the medical record, reviewed, and corrections made.    Objective:    General: Speaking clearly in  complete sentences without any shortness of breath.  Alert and oriented x3.  Normal judgment. No apparent acute distress.    Impression and Recommendations:    Problem List Items Addressed This Visit       Cardiovascular and Mediastinum   Congestive heart failure with left ventricular diastolic dysfunction (HCC)    No evidence of volume overload.  Her weight is actually been really stable.  She is due for labs in July just to check renal function and potassium.  Continue metoprolol, furosemide, potassium.      Relevant Medications   diltiazem (TIADYLT ER) 180 MG 24 hr capsule   Other Relevant Orders   COMPLETE METABOLIC PANEL WITH GFR   Atrial fibrillation (HCC)    Continue Eliquis.      Relevant Medications   diltiazem (TIADYLT ER) 180 MG 24 hr capsule   Other Relevant Orders   COMPLETE METABOLIC PANEL WITH GFR     Respiratory   Pulmonary fibrosis (HCC)    Continue daily Breo.  She says she feels like she gets a pretty good response from the albuterol as well and wants to know if she can use that after the [facility].  I think that would be absolutely reasonable especially if it helps her feel like she is moving air better.  Could consider trial of daily Mucinex to help with the morning mucus production as well.  Call  if any new or worsening symptoms.  Vision levels have been maintaining well.        Endocrine   IFG (impaired fasting glucose)   Relevant Orders   Hemoglobin A1c     Other   Oxygen dependent - Primary    Currently uses 2 L.      Other Visit Diagnoses     Low serum vitamin B12       Relevant Orders   B12   Abnormal glucose       Relevant Orders   Hemoglobin A1c   Acute cough       Relevant Medications   benzonatate (TESSALON) 200 MG capsule       Orders Placed This Encounter  Procedures   B12   Hemoglobin A1c   COMPLETE METABOLIC PANEL WITH GFR    Meds ordered this encounter  Medications   benzonatate (TESSALON) 200 MG capsule    Sig: Take  1 capsule (200 mg total) by mouth 2 (two) times daily as needed for cough.    Dispense:  60 capsule    Refill:  1   diltiazem (TIADYLT ER) 180 MG 24 hr capsule    Sig: Take 1 capsule (180 mg total) by mouth daily.    Dispense:  90 capsule    Refill:  3     I discussed the assessment and treatment plan with the patient. The patient was provided an opportunity to ask questions and all were answered. The patient agreed with the plan and demonstrated an understanding of the instructions.   The patient was advised to call back or seek an in-person evaluation if the symptoms worsen or if the condition fails to improve as anticipated.   [name], MD

## 2023-03-21 NOTE — Assessment & Plan Note (Signed)
Currently uses 2 L.

## 2023-03-21 NOTE — Assessment & Plan Note (Signed)
Continue daily Breo.  She says she feels like she gets a pretty good response from the albuterol as well and wants to know if she can use that after the [facility].  I think that would be absolutely reasonable especially if it helps her feel like she is moving air better.  Could consider trial of daily Mucinex to help with the morning mucus production as well.  Call if any new or worsening symptoms.  Vision levels have been maintaining well.

## 2023-03-21 NOTE — Progress Notes (Signed)
Called pt and spoke to her daughter and she informed me that her husband Raiford Noble is with her mother and to call his phone at [phone].  I did called and LVM advising him that I was calling to do her prescreening and that I would call back but he could also log her into her mychart so that she would be ready to start her visit with Dr. Linford Arnold.

## 2023-05-28 ENCOUNTER — Other Ambulatory Visit: Payer: Self-pay | Admitting: Family Medicine

## 2023-05-28 DIAGNOSIS — I5032 Chronic diastolic (congestive) heart failure: Secondary | ICD-10-CM

## 2023-06-15 ENCOUNTER — Telehealth: Payer: Self-pay | Admitting: Family Medicine

## 2023-06-15 ENCOUNTER — Other Ambulatory Visit: Payer: Self-pay | Admitting: Family Medicine

## 2023-06-15 NOTE — Telephone Encounter (Signed)
Patient called in stating that she is worried that her body has become accustom to the Lasix. States that her weight has gone up the last couple of days. Please Advise.

## 2023-06-16 ENCOUNTER — Encounter: Payer: Self-pay | Admitting: Family Medicine

## 2023-06-16 ENCOUNTER — Ambulatory Visit (INDEPENDENT_AMBULATORY_CARE_PROVIDER_SITE_OTHER): Payer: Medicare Other

## 2023-06-16 ENCOUNTER — Ambulatory Visit (INDEPENDENT_AMBULATORY_CARE_PROVIDER_SITE_OTHER): Payer: Medicare Other | Admitting: Family Medicine

## 2023-06-16 VITALS — BP 117/64 | HR 76 | Ht 63.0 in | Wt 158.0 lb

## 2023-06-16 DIAGNOSIS — R7301 Impaired fasting glucose: Secondary | ICD-10-CM

## 2023-06-16 DIAGNOSIS — I5032 Chronic diastolic (congestive) heart failure: Secondary | ICD-10-CM | POA: Diagnosis not present

## 2023-06-16 DIAGNOSIS — J8410 Pulmonary fibrosis, unspecified: Secondary | ICD-10-CM | POA: Diagnosis not present

## 2023-06-16 DIAGNOSIS — C44619 Basal cell carcinoma of skin of left upper limb, including shoulder: Secondary | ICD-10-CM

## 2023-06-16 DIAGNOSIS — R0602 Shortness of breath: Secondary | ICD-10-CM | POA: Diagnosis not present

## 2023-06-16 DIAGNOSIS — I4891 Unspecified atrial fibrillation: Secondary | ICD-10-CM

## 2023-06-16 DIAGNOSIS — D485 Neoplasm of uncertain behavior of skin: Secondary | ICD-10-CM | POA: Diagnosis not present

## 2023-06-16 DIAGNOSIS — C44320 Squamous cell carcinoma of skin of unspecified parts of face: Secondary | ICD-10-CM

## 2023-06-16 MED ORDER — FUROSEMIDE 10 MG/ML IJ SOLN
60.0000 mg | Freq: Once | INTRAMUSCULAR | Status: AC
Start: 2023-06-16 — End: 2023-06-16
  Administered 2023-06-16: 60 mg via INTRAMUSCULAR

## 2023-06-16 NOTE — Assessment & Plan Note (Signed)
60 mg of IM Lasix today and recommend continue with 2 tabs in the mornings for total of 80 mg until she is all the way back down to baseline weight of 153.5 pounds.  Will check BNP.  Last echocardiogram was in 2018  Echo 02/25/17: EF 50-55% 1-2+ MR, 2-3+ TR, mod biatrial dilatation. RVSP 40-55 mmHg consistent with moderate pulmonary hypertension. Mild aortic sclerosis.[facility]

## 2023-06-16 NOTE — Progress Notes (Addendum)
She also had 2 skin lesions today that she wanted me to look at 1 on her left facial cheek below her eye and one on her left outer arm the 1 on the facial cheek was hyperkeratotic with a small horn and the one on the left upper outer arm approximately 8 mm in size was erythematous with a slightly lower raised edge more consistent with a basal cell.  Lesion on the face most consistent with squamous cell, lesion on left upper outer arm most consistent with a basal cell.  Patient requested cryotherapy treatment.  She does understand that these lesions will come back but right now she would like to improve the lesions so that she is less likely to pick at them and irritate them.  Cryotherapy Procedure Note  Pre-operative Diagnosis: S atypical lesion   Post-operative Diagnosis: Possible squamous and basal cell  Locations: Left facial cheek and left upper outer arm near the shoulder  Indications: Possible cancerous lesions, irritating.  Anesthesia: None required  Procedure Details   Patient informed of risks (permanent scarring, infection, light or dark discoloration, bleeding, infection, weakness, numbness and recurrence of the lesion) and benefits of the procedure and verbal informed consent obtained.  The areas are treated with liquid nitrogen therapy, frozen until ice ball extended 1-2 mm beyond lesion, allowed to thaw, and treated again. The patient tolerated procedure well.  The patient was instructed on post-op care, warned that there may be blister formation, redness and pain. Recommend OTC analgesia as needed for pain.  Condition: Stable  Complications: none.  Plan: 1. Instructed to keep the area dry and covered for 24-48h and clean thereafter. 2. Warning signs of infection were reviewed.   3. Recommended that the patient use OTC acetaminophen as needed for pain.  4. Return PRN.

## 2023-06-16 NOTE — Patient Instructions (Signed)
Increase lasix to 2 tabs daily until  your weight is all the way back to baseline.

## 2023-06-16 NOTE — Telephone Encounter (Signed)
Spoke w/pt's daughter she reports some SOB and has gained about 2.5-3 lbs over the past week.she gets out of breath walking approximately 30 ft.  She is taking a total of 80 mg of lasix over the past 2 or 3 weeks.   Her daughter stated that she can bring her in either today or tomorrow which ever Dr. Linford Arnold would like her to do.

## 2023-06-16 NOTE — Progress Notes (Signed)
Acute Office Visit  Subjective:     Patient ID: Terri Small, female    DOB: Feb 27, 1931, 89 y.o.   MRN: [id number]  Chief Complaint  Patient presents with   Shortness of Breath   Weight Gain    HPI Patient is in today for SOB and weight gain. Some Chest pain when walking and radiates into her jaw.  She has been taking 2 tabs of furosemide today and about 2 days ago but yesterday she only took 1 tab.  Normally she only takes 1 tab daily.  She is up about 5 lbs on our scale and up about 3-1/2 pounds on her home scale she says that her home-based weight is around 153.5 pounds.  She says the shortness of breath has been present for probably about 3 weeks but the weight change has been going on only for about a week.  She has not had any other cold or fever symptoms.  She has not had any sick contacts she normally wears 2 L of oxygen.  She also notes that when she gets up and walks she will get an ache in the back of her legs.  Has been using her albuterol.  Sounds like she can only walk about 20 feet before she has to stop and rest.  Normally she is able to get a little further.   ROS      Objective:    BP 117/64   Pulse 76   Ht 5\' 3"  (1.6 m)   Wt 158 lb (71.7 kg)   SpO2 93% Comment: 2L  BMI 27.99 kg/m    Physical Exam Vitals and nursing note reviewed.  Constitutional:      Appearance: Normal appearance.  HENT:     Head: Normocephalic and atraumatic.  Eyes:     Conjunctiva/sclera: Conjunctivae normal.  Neck:     Vascular: No carotid bruit.  Cardiovascular:     Rate and Rhythm: Normal rate. Rhythm irregular.     Heart sounds: Normal heart sounds.  Pulmonary:     Effort: Pulmonary effort is normal.     Breath sounds: Normal breath sounds.  Musculoskeletal:     Comments: Edema of lower extremities bilaterally from the knees down.  Skin:    General: Skin is warm and dry.  Neurological:     Mental Status: She is alert.  Psychiatric:        Mood and Affect: Mood  normal.     No results found for any visits on 06/16/23.      Assessment & Plan:   Problem List Items Addressed This Visit       Cardiovascular and Mediastinum   Congestive heart failure with left ventricular diastolic dysfunction (HCC)    60 mg of IM Lasix today and recommend continue with 2 tabs in the mornings for total of 80 mg until she is all the way back down to baseline weight of 153.5 pounds.  Will check BNP.  Last echocardiogram was in 2018  Echo 02/25/17: EF 50-55% 1-2+ MR, 2-3+ TR, mod biatrial dilatation. RVSP 40-55 mmHg consistent with moderate pulmonary hypertension. Mild aortic sclerosis.[facility]       Relevant Orders   CMP14+EGFR   CBC with Differential/Platelet   B Nat Peptide   TSH   Hemoglobin A1c   DG Chest 2 View   Atrial fibrillation (HCC)   Relevant Orders   CMP14+EGFR   CBC with Differential/Platelet   B Nat Peptide   TSH   Hemoglobin A1c  DG Chest 2 View     Respiratory   Pulmonary fibrosis (HCC)   Relevant Orders   CMP14+EGFR   CBC with Differential/Platelet   B Nat Peptide   TSH   Hemoglobin A1c   DG Chest 2 View     Endocrine   IFG (impaired fasting glucose)   Relevant Orders   CMP14+EGFR   CBC with Differential/Platelet   B Nat Peptide   TSH   Hemoglobin A1c   DG Chest 2 View   Other Visit Diagnoses     SOB (shortness of breath)    -  Primary   Relevant Orders   CMP14+EGFR   CBC with Differential/Platelet   B Nat Peptide   TSH   Hemoglobin A1c   DG Chest 2 View      ATypical chest pain-performed EKG today it is consistent with known atrial fibrillation she has some right axis deviation but no significant changes compared to prior EKG which is reassuring.  Some of the chest pain could be from volume overload.  History of diastolic heart failure though this seems more consistent with systolic heart failure.  We discussed getting a chest x-ray and updated labs will evaluate BNP.  Will also check a CBC to evaluate for  anemia and thyroid disorder.  Aches in the back of her legs could be related to dropping oxygen levels with activity but also could be related to peripheral vascular disease.  Will put off that workup for now.  And work on just trying to get the volume status improved and see if she feels better and if we can get her back to baseline.  EKG today shows atrial fibrillation with a rate around 78 bpm,.  There are some right axis deviation.  No significant change compared to EKG from 2017.   Meds ordered this encounter  Medications   furosemide (LASIX) injection 60 mg   I spent 40 minutes on the day of the encounter to include pre-visit record review, face-to-face time with the patient and post visit ordering of test.  No follow-ups on file.  [name], MD

## 2023-06-16 NOTE — Telephone Encounter (Signed)
Called pt's daughter back she didn't answer the patient did. I told her that we would be able to see her today to check in at 4pm she stated that she would pass the message to her daughter. It told her that if there was an issue with coming in today to let us know. She voiced understanding and agreed.

## 2023-06-16 NOTE — Addendum Note (Signed)
Addended by: [name] D on: 06/16/2023 06:03 PM   Modules accepted: Level of Service

## 2023-06-16 NOTE — Telephone Encounter (Signed)
Pt reached out to [facility] from [facility] ([facility]with concerns about her weight increase over the last couple of days with the Lasix. Please Advise

## 2023-06-17 NOTE — Progress Notes (Signed)
Awesome!!!!!!!!!! That shot worked well.

## 2023-06-17 NOTE — Progress Notes (Signed)
Good morning Terri Small, kidney function is up just a little bit normally it is 1.0 you are around 1.2.  That could be because of some of that volume shift that is been going on.  Liver function looks great and electrolytes are normal.  No anemia.  I did go ahead and check an A1c since we were doing blood work and it had been more than 6 months and and that looks great it still 6.0 in the prediabetes range.  Thyroid looks good.  Still awaiting that BNP test.  Hopefully will be back later today.  I would also try to limit fluids to no more than 50 ounces a day, that way you are not taking in as much as you are peeing out.

## 2023-06-17 NOTE — Progress Notes (Signed)
HI Terri Small, your chest x-ray looks okay in regards to no pneumonia.  They just see the atelectasis and scarring that is there with your history of pulmonary fibrosis that is not unusual.  They did see just a little bit of possible extra fluid in the lungs which is not surprising with your weight being up.  Let us know if you are not feeling better after the weekend especially if you are able to get your weight back down.  I would encourage you to get a flu shot in September.

## 2023-06-18 LAB — CMP14+EGFR
ALT: 10 IU/L (ref 0–32)
AST: 21 IU/L (ref 0–40)
Albumin: 4.4 g/dL (ref 3.6–4.6)
Alkaline Phosphatase: 107 IU/L (ref 44–121)
BUN/Creatinine Ratio: 23 (ref 12–28)
BUN: 29 mg/dL (ref 10–36)
Bilirubin Total: 0.6 mg/dL (ref 0.0–1.2)
CO2: 26 mmol/L (ref 20–29)
Calcium: 9.2 mg/dL (ref 8.7–10.3)
Chloride: 104 mmol/L (ref 96–106)
Creatinine, Ser: 1.26 mg/dL — ABNORMAL HIGH (ref 0.57–1.00)
Globulin, Total: 2.1 g/dL (ref 1.5–4.5)
Glucose: 106 mg/dL — ABNORMAL HIGH (ref 70–99)
Potassium: 4.3 mmol/L (ref 3.5–5.2)
Sodium: 144 mmol/L (ref 134–144)
Total Protein: 6.5 g/dL (ref 6.0–8.5)
eGFR: 41 mL/min/{1.73_m2} — ABNORMAL LOW (ref >59)

## 2023-06-18 LAB — CBC WITH DIFFERENTIAL/PLATELET
Basophils Absolute: 0 10*3/uL (ref 0.0–0.2)
Basos: 0 %
EOS (ABSOLUTE): 0.1 10*3/uL (ref 0.0–0.4)
Eos: 2 %
Hematocrit: 38.5 % (ref 34.0–46.6)
Hemoglobin: 12.3 g/dL (ref 11.1–15.9)
Immature Grans (Abs): 0 10*3/uL (ref 0.0–0.1)
Immature Granulocytes: 0 %
Lymphocytes Absolute: 1.1 10*3/uL (ref 0.7–3.1)
Lymphs: 21 %
MCH: 32.7 pg (ref 26.6–33.0)
MCHC: 31.9 g/dL (ref 31.5–35.7)
MCV: 102 fL — ABNORMAL HIGH (ref 79–97)
Monocytes Absolute: 0.5 10*3/uL (ref 0.1–0.9)
Monocytes: 10 %
Neutrophils Absolute: 3.6 10*3/uL (ref 1.4–7.0)
Neutrophils: 67 %
Platelets: 174 10*3/uL (ref 150–450)
RBC: 3.76 x10E6/uL — ABNORMAL LOW (ref 3.77–5.28)
RDW: 12.5 % (ref 11.7–15.4)
WBC: 5.4 10*3/uL (ref 3.4–10.8)

## 2023-06-18 LAB — TSH: TSH: 2.71 u[IU]/mL (ref 0.450–4.500)

## 2023-06-18 LAB — HEMOGLOBIN A1C
Est. average glucose Bld gHb Est-mCnc: 126 mg/dL
Hgb A1c MFr Bld: 6 % — ABNORMAL HIGH (ref 4.8–5.6)

## 2023-06-18 LAB — BRAIN NATRIURETIC PEPTIDE: BNP: 503.3 pg/mL — ABNORMAL HIGH (ref 0.0–100.0)

## 2023-06-21 NOTE — Progress Notes (Signed)
Hi Ms. Terri, Small to drop back to 1 tab daily if she has not already.  Goal was just to get her back to her baseline and then go back to her usual dosing.  If at any point she goes up more than 2 pounds above her baseline then she can take 2 daily until she gets back down.  The BNP did confirm that she was in acute heart failure episode but it sounds like things have gotten under control again which is fantastic.

## 2023-06-23 ENCOUNTER — Telehealth: Payer: Medicare Other | Admitting: Family Medicine

## 2023-06-23 NOTE — Addendum Note (Signed)
Addended by: Chalmers Cater on: 06/23/2023 08:14 AM   Modules accepted: Orders

## 2023-07-18 ENCOUNTER — Telehealth: Payer: Self-pay | Admitting: Family Medicine

## 2023-07-18 NOTE — Telephone Encounter (Signed)
Patient was admitted to Hospice at home on Friday 07-15-23 from [facility] Phone number is [phone]

## 2023-07-27 NOTE — Telephone Encounter (Signed)
FMLA Paperwork complete and placed in tony B basket.

## 2023-09-21 ENCOUNTER — Telehealth: Payer: Self-pay | Admitting: Family Medicine

## 2023-09-21 NOTE — Telephone Encounter (Signed)
Pts daughter called the office and asked for me and wanted to make sure that I let Dr.Metheney know that her mother passed away last night in the arms of her granddaughter with hospice and not to long after a bath. She said she appreciated the care we always gave to her mother and father and wanted [place] to know.

## 2023-10-19 DEATH — deceased
